# Patient Record
Sex: Female | Born: 1955 | Race: White | Hispanic: No | Marital: Married | Smoking: Never smoker
Health system: Southern US, Community
[De-identification: ages and names within clinical notes are randomized; demographics above are authoritative.]

## PROBLEM LIST (undated history)

## (undated) ENCOUNTER — Ambulatory Visit: Payer: 59

## (undated) DIAGNOSIS — Z9889 Other specified postprocedural states: Secondary | ICD-10-CM

## (undated) DIAGNOSIS — J189 Pneumonia, unspecified organism: Secondary | ICD-10-CM

## (undated) DIAGNOSIS — I10 Essential (primary) hypertension: Secondary | ICD-10-CM

## (undated) DIAGNOSIS — I8390 Asymptomatic varicose veins of unspecified lower extremity: Secondary | ICD-10-CM

## (undated) DIAGNOSIS — Z8489 Family history of other specified conditions: Secondary | ICD-10-CM

## (undated) DIAGNOSIS — R609 Edema, unspecified: Secondary | ICD-10-CM

## (undated) DIAGNOSIS — E039 Hypothyroidism, unspecified: Secondary | ICD-10-CM

## (undated) DIAGNOSIS — M1990 Unspecified osteoarthritis, unspecified site: Secondary | ICD-10-CM

## (undated) DIAGNOSIS — M21371 Foot drop, right foot: Secondary | ICD-10-CM

## (undated) DIAGNOSIS — R112 Nausea with vomiting, unspecified: Secondary | ICD-10-CM

## (undated) DIAGNOSIS — K219 Gastro-esophageal reflux disease without esophagitis: Secondary | ICD-10-CM

## (undated) HISTORY — PX: EYE SURGERY: SHX253

---

## 1986-02-23 HISTORY — PX: BREAST CYST ASPIRATION: SHX578

## 2004-04-24 ENCOUNTER — Ambulatory Visit: Payer: Self-pay | Admitting: Internal Medicine

## 2004-12-01 ENCOUNTER — Ambulatory Visit: Payer: Self-pay | Admitting: Obstetrics and Gynecology

## 2005-01-01 ENCOUNTER — Ambulatory Visit: Payer: Self-pay

## 2005-02-23 HISTORY — PX: VARICOSE VEIN SURGERY: SHX832

## 2006-01-20 ENCOUNTER — Ambulatory Visit: Payer: Self-pay | Admitting: Internal Medicine

## 2006-07-14 ENCOUNTER — Ambulatory Visit: Payer: Self-pay | Admitting: Internal Medicine

## 2006-11-10 ENCOUNTER — Ambulatory Visit: Payer: Self-pay | Admitting: Obstetrics and Gynecology

## 2006-12-10 ENCOUNTER — Ambulatory Visit: Payer: Self-pay | Admitting: Gastroenterology

## 2007-02-15 ENCOUNTER — Ambulatory Visit: Payer: Self-pay

## 2007-04-27 ENCOUNTER — Ambulatory Visit: Payer: Self-pay | Admitting: Internal Medicine

## 2008-02-22 ENCOUNTER — Ambulatory Visit: Payer: Self-pay

## 2008-05-10 ENCOUNTER — Ambulatory Visit: Payer: Self-pay | Admitting: Internal Medicine

## 2008-07-03 ENCOUNTER — Ambulatory Visit: Payer: Self-pay | Admitting: Internal Medicine

## 2008-07-04 ENCOUNTER — Ambulatory Visit: Payer: Self-pay

## 2008-09-27 ENCOUNTER — Ambulatory Visit: Payer: Self-pay | Admitting: Internal Medicine

## 2008-11-05 ENCOUNTER — Ambulatory Visit: Payer: Self-pay | Admitting: Internal Medicine

## 2009-03-05 ENCOUNTER — Ambulatory Visit: Payer: Self-pay | Admitting: Internal Medicine

## 2009-05-30 ENCOUNTER — Ambulatory Visit: Payer: Self-pay | Admitting: Internal Medicine

## 2010-01-30 ENCOUNTER — Ambulatory Visit: Payer: Self-pay | Admitting: Obstetrics and Gynecology

## 2010-01-30 ENCOUNTER — Ambulatory Visit: Payer: Self-pay | Admitting: Internal Medicine

## 2010-02-04 ENCOUNTER — Ambulatory Visit: Payer: Self-pay | Admitting: Obstetrics and Gynecology

## 2010-02-04 ENCOUNTER — Ambulatory Visit: Payer: Self-pay | Admitting: Internal Medicine

## 2010-03-10 ENCOUNTER — Other Ambulatory Visit: Payer: Self-pay | Admitting: Internal Medicine

## 2010-03-12 ENCOUNTER — Ambulatory Visit: Payer: Self-pay | Admitting: Physician Assistant

## 2010-03-17 ENCOUNTER — Ambulatory Visit: Payer: Self-pay | Admitting: Internal Medicine

## 2010-04-23 ENCOUNTER — Ambulatory Visit: Payer: Self-pay | Admitting: Internal Medicine

## 2010-06-05 ENCOUNTER — Other Ambulatory Visit: Payer: Self-pay | Admitting: Obstetrics and Gynecology

## 2011-02-11 ENCOUNTER — Ambulatory Visit: Payer: Self-pay | Admitting: Internal Medicine

## 2011-04-21 ENCOUNTER — Ambulatory Visit: Payer: Self-pay | Admitting: Internal Medicine

## 2012-02-09 ENCOUNTER — Ambulatory Visit: Payer: Self-pay | Admitting: Internal Medicine

## 2012-02-10 ENCOUNTER — Ambulatory Visit: Payer: Self-pay | Admitting: Internal Medicine

## 2012-02-10 LAB — URINALYSIS, COMPLETE
Blood: NEGATIVE
Glucose,UR: NEGATIVE mg/dL (ref 0–75)
Ketone: NEGATIVE
Nitrite: NEGATIVE
Protein: NEGATIVE
Specific Gravity: 1.01 (ref 1.003–1.030)

## 2012-02-10 LAB — CBC WITH DIFFERENTIAL/PLATELET
Eosinophil #: 0.1 10*3/uL (ref 0.0–0.7)
Eosinophil %: 1.6 %
HGB: 12.7 g/dL (ref 12.0–16.0)
Lymphocyte %: 28.7 %
MCHC: 32.7 g/dL (ref 32.0–36.0)
MCV: 83 fL (ref 80–100)
Monocyte #: 0.6 x10 3/mm (ref 0.2–0.9)
Neutrophil %: 60.5 %
Platelet: 242 10*3/uL (ref 150–440)
RBC: 4.67 10*6/uL (ref 3.80–5.20)
WBC: 7.3 10*3/uL (ref 3.6–11.0)

## 2012-02-10 LAB — BASIC METABOLIC PANEL
Anion Gap: 7 (ref 7–16)
Creatinine: 0.92 mg/dL (ref 0.60–1.30)
EGFR (African American): >60
EGFR (Non-African Amer.): >60
Glucose: 78 mg/dL (ref 65–99)
Sodium: 140 mmol/L (ref 136–145)

## 2012-02-23 ENCOUNTER — Ambulatory Visit: Payer: Self-pay | Admitting: Internal Medicine

## 2012-09-21 ENCOUNTER — Ambulatory Visit: Payer: Self-pay | Admitting: Internal Medicine

## 2012-09-21 LAB — URINALYSIS, COMPLETE
Bacteria: NEGATIVE
Blood: NEGATIVE
Glucose,UR: NEGATIVE mg/dL (ref 0–75)
Leukocyte Esterase: NEGATIVE
Nitrite: NEGATIVE
Protein: NEGATIVE
Specific Gravity: 1.015 (ref 1.003–1.030)

## 2012-09-21 LAB — LIPID PANEL
Cholesterol: 152 mg/dL (ref 0–200)
HDL Cholesterol: 70 mg/dL — ABNORMAL HIGH (ref 40–60)
Ldl Cholesterol, Calc: 69 mg/dL (ref 0–100)
Triglycerides: 65 mg/dL (ref 0–200)
VLDL Cholesterol, Calc: 13 mg/dL (ref 5–40)

## 2012-09-21 LAB — CBC WITH DIFFERENTIAL/PLATELET
Basophil #: 0.1 10*3/uL (ref 0.0–0.1)
Eosinophil #: 0.1 10*3/uL (ref 0.0–0.7)
Eosinophil %: 1.9 %
HGB: 13.4 g/dL (ref 12.0–16.0)
Lymphocyte %: 23.7 %
MCV: 82 fL (ref 80–100)
Monocyte %: 7.9 %
Neutrophil #: 4.6 10*3/uL (ref 1.4–6.5)
Platelet: 232 10*3/uL (ref 150–440)
RDW: 14.8 % — ABNORMAL HIGH (ref 11.5–14.5)
WBC: 7.1 10*3/uL (ref 3.6–11.0)

## 2012-09-21 LAB — BASIC METABOLIC PANEL
Chloride: 100 mmol/L (ref 98–107)
Co2: 33 mmol/L — ABNORMAL HIGH (ref 21–32)
EGFR (African American): >60
Osmolality: 281 (ref 275–301)
Potassium: 3.9 mmol/L (ref 3.5–5.1)
Sodium: 140 mmol/L (ref 136–145)

## 2012-10-25 ENCOUNTER — Ambulatory Visit: Payer: Self-pay | Admitting: Internal Medicine

## 2012-10-25 LAB — TSH: Thyroid Stimulating Horm: 5.24 u[IU]/mL — ABNORMAL HIGH

## 2013-01-31 ENCOUNTER — Ambulatory Visit: Payer: Self-pay | Admitting: Internal Medicine

## 2014-01-28 ENCOUNTER — Ambulatory Visit: Payer: Self-pay | Admitting: Physician Assistant

## 2014-02-09 ENCOUNTER — Ambulatory Visit: Payer: Self-pay | Admitting: Internal Medicine

## 2014-02-23 DIAGNOSIS — M21371 Foot drop, right foot: Secondary | ICD-10-CM

## 2014-02-23 HISTORY — DX: Foot drop, right foot: M21.371

## 2014-03-08 NOTE — H&P (Signed)
TOTAL KNEE ADMISSION H&P  Patient is being admitted for right total knee arthroplasty.  Subjective:  Chief Complaint:     Right knee primary OA /pain.  HPI: Julie Castillo, 59 y.o. female, has a history of pain and functional disability in the right knee due to arthritis and has failed non-surgical conservative treatments for greater than 12 weeks to includeNSAID's and/or analgesics, corticosteriod injections, viscosupplementation injections, use of assistive devices and activity modification.  Onset of symptoms was gradual, starting 3-4 years ago with gradually worsening course since that time. The patient noted no past surgery on the right knee(s).  Patient currently rates pain in the right knee(s) at 9 out of 10 with activity. Patient has night pain, worsening of pain with activity and weight bearing, pain that interferes with activities of daily living, pain with passive range of motion, crepitus and joint swelling.  Patient has evidence of periarticular osteophytes and joint space narrowing by imaging studies.  There is no active infection.  Risks, benefits and expectations were discussed with the patient.  Risks including but not limited to the risk of anesthesia, blood clots, nerve damage, blood vessel damage, failure of the prosthesis, infection and up to and including death.  Patient understand the risks, benefits and expectations and wishes to proceed with surgery.   PCP: Emerson Monte J  D/C Plans:      Home with HHPT  Post-op Meds:       No Rx given   Tranexamic Acid:      To be given - IV    Decadron:      Is to be given  FYI:     ASA post-op  Norco post-op    Past Medical History  Diagnosis Date  . Family history of adverse reaction to anesthesia     father- post op became violent   . Hypertension   . Varicose veins   . Edema     lower extremities after standing a long time   . Pneumonia     hx of   . Hypothyroidism   . GERD (gastroesophageal reflux disease)   .  Arthritis     Past Surgical History  Procedure Laterality Date  . Eye surgery      left eye surgery x 2 ( eyelid)     No prescriptions prior to admission   Allergies  Allergen Reactions  . Adhesive [Tape]     Sensitive skin- can use paper tape  . Sulfa Antibiotics Hives    History  Substance Use Topics  . Smoking status: Never Smoker   . Smokeless tobacco: Never Used  . Alcohol Use: Yes     Comment: occasional     No family history on file.   Review of Systems  Constitutional: Negative.   HENT: Negative.   Eyes: Negative.   Respiratory: Negative.   Cardiovascular: Negative.   Gastrointestinal: Positive for heartburn.  Genitourinary: Negative.   Musculoskeletal: Positive for joint pain.  Skin: Negative.   Neurological: Negative.   Endo/Heme/Allergies: Negative.   Psychiatric/Behavioral: Negative.     Objective:  Physical Exam  Constitutional: She is oriented to person, place, and time. She appears well-developed and well-nourished.  HENT:  Head: Normocephalic and atraumatic.  Eyes: Pupils are equal, round, and reactive to light.  Neck: Neck supple. No JVD present. No tracheal deviation present. No thyromegaly present.  Cardiovascular: Normal rate, regular rhythm, normal heart sounds and intact distal pulses.   Respiratory: Effort normal and breath sounds  normal. No stridor. No respiratory distress. She has no wheezes.  GI: Soft. There is no tenderness. There is no guarding.  Musculoskeletal:       Right knee: She exhibits decreased range of motion, swelling and bony tenderness. She exhibits no ecchymosis, no deformity, no laceration and no erythema. Tenderness found.  Lymphadenopathy:    She has no cervical adenopathy.  Neurological: She is alert and oriented to person, place, and time.  Skin: Skin is warm and dry.  Psychiatric: She has a normal mood and affect.     Imaging Review Plain radiographs demonstrate severe degenerative joint disease of the right  knee(s). The overall alignment is neutral. The bone quality appears to be good for age and reported activity level.  Assessment/Plan:  End stage arthritis, right knee   The patient history, physical examination, clinical judgment of the provider and imaging studies are consistent with end stage degenerative joint disease of the right knee(s) and total knee arthroplasty is deemed medically necessary. The treatment options including medical management, injection therapy arthroscopy and arthroplasty were discussed at length. The risks and benefits of total knee arthroplasty were presented and reviewed. The risks due to aseptic loosening, infection, stiffness, patella tracking problems, thromboembolic complications and other imponderables were discussed. The patient acknowledged the explanation, agreed to proceed with the plan and consent was signed. Patient is being admitted for inpatient treatment for surgery, pain control, PT, OT, prophylactic antibiotics, VTE prophylaxis, progressive ambulation and ADL's and discharge planning. The patient is planning to be discharged home with home health services.    West Pugh Nimrat Woolworth   PA-C  03/15/2014, 9:30 AM

## 2014-03-08 NOTE — Patient Instructions (Addendum)
Julie Castillo  03/08/2014   Your procedure is scheduled on: 03/20/14    Report to [facility] Main  Entrance and follow signs to               [place] at       [place] AM.  Call this number if you have problems the morning of surgery [phone]   Remember:  Do not eat food or drink liquids :After Midnight.     Take these medicines the morning of surgery with A SIP OF WATER: Prilosec, Zyrtec, Synthroid                                You may not have any metal on your body including hair pins and              piercings  Do not wear jewelry, make-up, lotions, powders or perfumes.             Do not wear nail polish.  Do not shave  48 hours prior to surgery.                Do not bring valuables to the hospital. [place].  Contacts, dentures or bridgework may not be worn into surgery.  Leave suitcase in the car. After surgery it may be brought to your room.     Special Instructions: coughing and deep breathing exercises, leg exercises               Please read over the following fact sheets you were given: _____________________________________________________________________             [facility] - Preparing for Surgery Before surgery, you can play an important role.  Because skin is not sterile, your skin needs to be as free of germs as possible.  You can reduce the number of germs on your skin by washing with CHG (chlorahexidine gluconate) soap before surgery.  CHG is an antiseptic cleaner which kills germs and bonds with the skin to continue killing germs even after washing. Please DO NOT use if you have an allergy to CHG or antibacterial soaps.  If your skin becomes reddened/irritated stop using the CHG and inform your nurse when you arrive at Short Stay. Do not shave (including legs and underarms) for at least 48 hours prior to the first CHG shower.  You may shave your face/neck. Please follow  these instructions carefully:  1.  Shower with CHG Soap the night before surgery and the  morning of Surgery.  2.  If you choose to wash your hair, wash your hair first as usual with your  normal  shampoo.  3.  After you shampoo, rinse your hair and body thoroughly to remove the  shampoo.                           4.  Use CHG as you would any other liquid soap.  You can apply chg directly  to the skin and wash                       Gently with a scrungie or clean washcloth.  5.  Apply the  CHG Soap to your body ONLY FROM THE NECK DOWN.   Do not use on face/ open                           Wound or open sores. Avoid contact with eyes, ears mouth and genitals (private parts).                       Wash face,  Genitals (private parts) with your normal soap.             6.  Wash thoroughly, paying special attention to the area where your surgery  will be performed.  7.  Thoroughly rinse your body with warm water from the neck down.  8.  DO NOT shower/wash with your normal soap after using and rinsing off  the CHG Soap.                9.  Pat yourself dry with a clean towel.            10.  Wear clean pajamas.            11.  Place clean sheets on your bed the night of your first shower and do not  sleep with pets. Day of Surgery : Do not apply any lotions/deodorants the morning of surgery.  Please wear clean clothes to the hospital/surgery center.  FAILURE TO FOLLOW THESE INSTRUCTIONS MAY RESULT IN THE CANCELLATION OF YOUR SURGERY PATIENT SIGNATURE_________________________________  NURSE SIGNATURE__________________________________  ________________________________________________________________________  WHAT IS A BLOOD TRANSFUSION? Blood Transfusion Information  A transfusion is the replacement of blood or some of its parts. Blood is made up of multiple cells which provide different functions.  Red blood cells carry oxygen and are used for blood loss replacement.  White blood cells fight  against infection.  Platelets control bleeding.  Plasma helps clot blood.  Other blood products are available for specialized needs, such as hemophilia or other clotting disorders. BEFORE THE TRANSFUSION  Who gives blood for transfusions?   Healthy volunteers who are fully evaluated to make sure their blood is safe. This is blood bank blood. Transfusion therapy is the safest it has ever been in the practice of medicine. Before blood is taken from a donor, a complete history is taken to make sure that person has no history of diseases nor engages in risky social behavior (examples are intravenous drug use or sexual activity with multiple partners). The donor's travel history is screened to minimize risk of transmitting infections, such as malaria. The donated blood is tested for signs of infectious diseases, such as HIV and hepatitis. The blood is then tested to be sure it is compatible with you in order to minimize the chance of a transfusion reaction. If you or a relative donates blood, this is often done in anticipation of surgery and is not appropriate for emergency situations. It takes many days to process the donated blood. RISKS AND COMPLICATIONS Although transfusion therapy is very safe and saves many lives, the main dangers of transfusion include:  1. Getting an infectious disease. 2. Developing a transfusion reaction. This is an allergic reaction to something in the blood you were given. Every precaution is taken to prevent this. The decision to have a blood transfusion has been considered carefully by your caregiver before blood is given. Blood is not given unless the benefits outweigh the risks. AFTER THE TRANSFUSION  Right after receiving a blood transfusion,  you will usually feel much better and more energetic. This is especially true if your red blood cells have gotten low (anemic). The transfusion raises the level of the red blood cells which carry oxygen, and this usually causes an  energy increase.  The nurse administering the transfusion will monitor you carefully for complications. HOME CARE INSTRUCTIONS  No special instructions are needed after a transfusion. You may find your energy is better. Speak with your caregiver about any limitations on activity for underlying diseases you may have. SEEK MEDICAL CARE IF:   Your condition is not improving after your transfusion.  You develop redness or irritation at the intravenous (IV) site. SEEK IMMEDIATE MEDICAL CARE IF:  Any of the following symptoms occur over the next 12 hours:  Shaking chills.  You have a temperature by mouth above 102 F (38.9 C), not controlled by medicine.  Chest, back, or muscle pain.  People around you feel you are not acting correctly or are confused.  Shortness of breath or difficulty breathing.  Dizziness and fainting.  You get a rash or develop hives.  You have a decrease in urine output.  Your urine turns a dark color or changes to pink, red, or brown. Any of the following symptoms occur over the next 10 days:  You have a temperature by mouth above 102 F (38.9 C), not controlled by medicine.  Shortness of breath.  Weakness after normal activity.  The white part of the eye turns yellow (jaundice).  You have a decrease in the amount of urine or are urinating less often.  Your urine turns a dark color or changes to pink, red, or brown. Document Released: 02/07/2000 Document Revised: 05/04/2011 Document Reviewed: 09/26/2007 ExitCare Patient Information 2014 [place].  _______________________________________________________________________  Incentive Spirometer  An incentive spirometer is a tool that can help keep your lungs clear and active. This tool measures how well you are filling your lungs with each breath. Taking long deep breaths may help reverse or decrease the chance of developing breathing (pulmonary) problems (especially infection) following:  A  long period of time when you are unable to move or be active. BEFORE THE PROCEDURE   If the spirometer includes an indicator to show your best effort, your nurse or respiratory therapist will set it to a desired goal.  If possible, sit up straight or lean slightly forward. Try not to slouch.  Hold the incentive spirometer in an upright position. INSTRUCTIONS FOR USE  3. Sit on the edge of your bed if possible, or sit up as far as you can in bed or on a chair. 4. Hold the incentive spirometer in an upright position. 5. Breathe out normally. 6. Place the mouthpiece in your mouth and seal your lips tightly around it. 7. Breathe in slowly and as deeply as possible, raising the piston or the ball toward the top of the column. 8. Hold your breath for 3-5 seconds or for as long as possible. Allow the piston or ball to fall to the bottom of the column. 9. Remove the mouthpiece from your mouth and breathe out normally. 10. Rest for a few seconds and repeat Steps 1 through 7 at least 10 times every 1-2 hours when you are awake. Take your time and take a few normal breaths between deep breaths. 11. The spirometer may include an indicator to show your best effort. Use the indicator as a goal to work toward during each repetition. 12. After each set of 10 deep breaths,  practice coughing to be sure your lungs are clear. If you have an incision (the cut made at the time of surgery), support your incision when coughing by placing a pillow or rolled up towels firmly against it. Once you are able to get out of bed, walk around indoors and cough well. You may stop using the incentive spirometer when instructed by your caregiver.  RISKS AND COMPLICATIONS  Take your time so you do not get dizzy or light-headed.  If you are in pain, you may need to take or ask for pain medication before doing incentive spirometry. It is harder to take a deep breath if you are having pain. AFTER USE  Rest and breathe slowly and  easily.  It can be helpful to keep track of a log of your progress. Your caregiver can provide you with a simple table to help with this. If you are using the spirometer at home, follow these instructions: [place] IF:   You are having difficultly using the spirometer.  You have trouble using the spirometer as often as instructed.  Your pain medication is not giving enough relief while using the spirometer.  You develop fever of 100.5 F (38.1 C) or higher. SEEK IMMEDIATE MEDICAL CARE IF:   You cough up bloody sputum that had not been present before.  You develop fever of 102 F (38.9 C) or greater.  You develop worsening pain at or near the incision site. MAKE SURE YOU:   Understand these instructions.  Will watch your condition.  Will get help right away if you are not doing well or get worse. Document Released: 06/22/2006 Document Revised: 05/04/2011 Document Reviewed: 08/23/2006 [facility] Patient Information 2014 [place], [place].   ________________________________________________________________________

## 2014-03-12 ENCOUNTER — Encounter (HOSPITAL_COMMUNITY)
Admission: RE | Admit: 2014-03-12 | Discharge: 2014-03-12 | Disposition: A | Discharge status: Home / Self Care | Payer: 59 | Source: Ambulatory Visit | Attending: Orthopedic Surgery | Admitting: Orthopedic Surgery

## 2014-03-12 ENCOUNTER — Encounter (HOSPITAL_COMMUNITY): Payer: Self-pay

## 2014-03-12 ENCOUNTER — Ambulatory Visit (HOSPITAL_COMMUNITY)
Admission: RE | Admit: 2014-03-12 | Discharge: 2014-03-12 | Disposition: A | Discharge status: Home / Self Care | Payer: 59 | Source: Ambulatory Visit | Attending: Orthopedic Surgery | Admitting: Orthopedic Surgery

## 2014-03-12 DIAGNOSIS — Z01818 Encounter for other preprocedural examination: Secondary | ICD-10-CM

## 2014-03-12 DIAGNOSIS — M4854XA Collapsed vertebra, not elsewhere classified, thoracic region, initial encounter for fracture: Secondary | ICD-10-CM | POA: Insufficient documentation

## 2014-03-12 HISTORY — DX: Edema, unspecified: R60.9

## 2014-03-12 HISTORY — DX: Gastro-esophageal reflux disease without esophagitis: K21.9

## 2014-03-12 HISTORY — DX: Unspecified osteoarthritis, unspecified site: M19.90

## 2014-03-12 HISTORY — DX: Essential (primary) hypertension: I10

## 2014-03-12 HISTORY — DX: Pneumonia, unspecified organism: J18.9

## 2014-03-12 HISTORY — DX: Family history of other specified conditions: Z84.89

## 2014-03-12 HISTORY — DX: Hypothyroidism, unspecified: E03.9

## 2014-03-12 HISTORY — DX: Asymptomatic varicose veins of unspecified lower extremity: I83.90

## 2014-03-12 LAB — ABO/RH: ABO/RH(D): O POS

## 2014-03-12 LAB — SURGICAL PCR SCREEN
MRSA, PCR: NEGATIVE
STAPHYLOCOCCUS AUREUS: NEGATIVE

## 2014-03-12 LAB — PROTIME-INR
INR: 0.95 (ref 0.00–1.49)
Prothrombin Time: 12.8 seconds (ref 11.6–15.2)

## 2014-03-12 LAB — APTT: aPTT: 29 seconds (ref 24–37)

## 2014-03-12 NOTE — Progress Notes (Signed)
Clearance- Dr Ramonita Lab 12/23/2013 on chart  2012 Stress Test on chart  Labs done 03/02/2014- CMP, Urinalysis, CBC/DIFF

## 2014-03-13 NOTE — Progress Notes (Signed)
Final EKG done 03/12/2014 in EPIC.

## 2014-03-13 NOTE — Progress Notes (Signed)
Dr Landry Dyke ( anesthesia) made aware of medical history, EKG from  03/12/2014 and stress test results from 2012 along with baseline EKG on stress test from 2012 .  No new orders given.

## 2014-03-20 ENCOUNTER — Encounter (HOSPITAL_COMMUNITY): Payer: Self-pay | Admitting: *Deleted

## 2014-03-20 ENCOUNTER — Inpatient Hospital Stay (HOSPITAL_COMMUNITY): Payer: 59 | Admitting: Certified Registered Nurse Anesthetist

## 2014-03-20 ENCOUNTER — Inpatient Hospital Stay (HOSPITAL_COMMUNITY)
Admission: RE | Admit: 2014-03-20 | Discharge: 2014-03-22 | DRG: 470 | Disposition: A | Discharge status: Home Health | Payer: 59 | Source: Ambulatory Visit | Attending: Orthopedic Surgery | Admitting: Orthopedic Surgery

## 2014-03-20 ENCOUNTER — Encounter (HOSPITAL_COMMUNITY)
Admission: RE | Disposition: A | Discharge status: Home Health | Payer: Self-pay | Source: Ambulatory Visit | Attending: Orthopedic Surgery

## 2014-03-20 DIAGNOSIS — K219 Gastro-esophageal reflux disease without esophagitis: Secondary | ICD-10-CM | POA: Diagnosis present

## 2014-03-20 DIAGNOSIS — Z951 Presence of aortocoronary bypass graft: Secondary | ICD-10-CM

## 2014-03-20 DIAGNOSIS — M659 Synovitis and tenosynovitis, unspecified: Secondary | ICD-10-CM | POA: Diagnosis present

## 2014-03-20 DIAGNOSIS — M25561 Pain in right knee: Secondary | ICD-10-CM | POA: Diagnosis present

## 2014-03-20 DIAGNOSIS — M1711 Unilateral primary osteoarthritis, right knee: Secondary | ICD-10-CM | POA: Diagnosis present

## 2014-03-20 DIAGNOSIS — Z96659 Presence of unspecified artificial knee joint: Secondary | ICD-10-CM

## 2014-03-20 DIAGNOSIS — E039 Hypothyroidism, unspecified: Secondary | ICD-10-CM | POA: Diagnosis present

## 2014-03-20 DIAGNOSIS — I10 Essential (primary) hypertension: Secondary | ICD-10-CM | POA: Diagnosis present

## 2014-03-20 DIAGNOSIS — Z96651 Presence of right artificial knee joint: Secondary | ICD-10-CM

## 2014-03-20 DIAGNOSIS — I252 Old myocardial infarction: Secondary | ICD-10-CM | POA: Diagnosis not present

## 2014-03-20 HISTORY — PX: TOTAL KNEE ARTHROPLASTY: SHX125

## 2014-03-20 LAB — TYPE AND SCREEN
ABO/RH(D): O POS
ANTIBODY SCREEN: NEGATIVE

## 2014-03-20 SURGERY — ARTHROPLASTY, KNEE, TOTAL
Anesthesia: Spinal | Laterality: Right

## 2014-03-20 MED ORDER — DOCUSATE SODIUM 100 MG PO CAPS
100.0000 mg | ORAL_CAPSULE | Freq: Two times a day (BID) | ORAL | Status: DC
  Administered 2014-03-20 – 2014-03-22 (×4): 100 mg via ORAL

## 2014-03-20 MED ORDER — ONDANSETRON HCL 4 MG/2ML IJ SOLN
INTRAMUSCULAR | Status: DC | PRN
  Administered 2014-03-20: 4 mg via INTRAVENOUS

## 2014-03-20 MED ORDER — HYDROCHLOROTHIAZIDE 25 MG PO TABS
25.0000 mg | ORAL_TABLET | Freq: Every day | ORAL | Status: DC
  Administered 2014-03-20 – 2014-03-22 (×3): 25 mg via ORAL
  Filled 2014-03-20 (×3): qty 1

## 2014-03-20 MED ORDER — POLYETHYLENE GLYCOL 3350 17 G PO PACK
17.0000 g | PACK | Freq: Two times a day (BID) | ORAL | Status: DC
  Administered 2014-03-21 – 2014-03-22 (×3): 17 g via ORAL

## 2014-03-20 MED ORDER — CEFAZOLIN SODIUM-DEXTROSE 2-3 GM-% IV SOLR
INTRAVENOUS | Status: AC
  Filled 2014-03-20: qty 50

## 2014-03-20 MED ORDER — DIPHENHYDRAMINE HCL 25 MG PO CAPS
25.0000 mg | ORAL_CAPSULE | Freq: Four times a day (QID) | ORAL | Status: DC | PRN
  Administered 2014-03-21: 25 mg via ORAL
  Filled 2014-03-20: qty 1

## 2014-03-20 MED ORDER — FERROUS SULFATE 325 (65 FE) MG PO TABS
325.0000 mg | ORAL_TABLET | Freq: Three times a day (TID) | ORAL | Status: DC
  Administered 2014-03-20 – 2014-03-21 (×2): 325 mg via ORAL
  Filled 2014-03-20 (×8): qty 1

## 2014-03-20 MED ORDER — ONDANSETRON HCL 4 MG/2ML IJ SOLN
INTRAMUSCULAR | Status: AC
  Filled 2014-03-20: qty 2

## 2014-03-20 MED ORDER — PROPOFOL INFUSION 10 MG/ML OPTIME
INTRAVENOUS | Status: DC | PRN
  Administered 2014-03-20: 50 ug/kg/min via INTRAVENOUS

## 2014-03-20 MED ORDER — ASPIRIN EC 325 MG PO TBEC
325.0000 mg | DELAYED_RELEASE_TABLET | Freq: Two times a day (BID) | ORAL | Status: DC
  Administered 2014-03-21 – 2014-03-22 (×3): 325 mg via ORAL
  Filled 2014-03-20 (×5): qty 1

## 2014-03-20 MED ORDER — OXYCODONE HCL 5 MG/5ML PO SOLN
5.0000 mg | Freq: Once | ORAL | Status: DC | PRN
  Filled 2014-03-20: qty 5

## 2014-03-20 MED ORDER — PROPOFOL 10 MG/ML IV BOLUS
INTRAVENOUS | Status: AC
  Filled 2014-03-20: qty 20

## 2014-03-20 MED ORDER — LORATADINE 10 MG PO TABS
10.0000 mg | ORAL_TABLET | Freq: Every day | ORAL | Status: DC
  Administered 2014-03-21 – 2014-03-22 (×2): 10 mg via ORAL
  Filled 2014-03-20 (×2): qty 1

## 2014-03-20 MED ORDER — LACTATED RINGERS IV SOLN: INTRAVENOUS | Status: DC

## 2014-03-20 MED ORDER — KETOROLAC TROMETHAMINE 30 MG/ML IJ SOLN
INTRAMUSCULAR | Status: DC | PRN
Start: 2014-03-20 — End: 2014-03-20
  Administered 2014-03-20: 30 mg via INTRAVENOUS

## 2014-03-20 MED ORDER — LEVOTHYROXINE SODIUM 25 MCG PO TABS
25.0000 ug | ORAL_TABLET | Freq: Every day | ORAL | Status: DC
  Administered 2014-03-21 – 2014-03-22 (×2): 25 ug via ORAL
  Filled 2014-03-20 (×4): qty 1

## 2014-03-20 MED ORDER — BUPIVACAINE IN DEXTROSE 0.75-8.25 % IT SOLN
INTRATHECAL | Status: DC | PRN
  Administered 2014-03-20: 1.8 mL via INTRATHECAL

## 2014-03-20 MED ORDER — SODIUM CHLORIDE 0.9 % IJ SOLN
INTRAMUSCULAR | Status: AC
  Filled 2014-03-20: qty 50

## 2014-03-20 MED ORDER — 0.9 % SODIUM CHLORIDE (POUR BTL) OPTIME
TOPICAL | Status: DC | PRN
  Administered 2014-03-20: 1000 mL

## 2014-03-20 MED ORDER — MIDAZOLAM HCL 2 MG/2ML IJ SOLN
INTRAMUSCULAR | Status: AC
  Filled 2014-03-20: qty 2

## 2014-03-20 MED ORDER — DEXAMETHASONE SODIUM PHOSPHATE 10 MG/ML IJ SOLN
INTRAMUSCULAR | Status: DC | PRN
  Administered 2014-03-20: 10 mg via INTRAVENOUS

## 2014-03-20 MED ORDER — METOCLOPRAMIDE HCL 10 MG PO TABS
5.0000 mg | ORAL_TABLET | Freq: Three times a day (TID) | ORAL | Status: DC | PRN
  Administered 2014-03-21: 5 mg via ORAL

## 2014-03-20 MED ORDER — DEXAMETHASONE SODIUM PHOSPHATE 10 MG/ML IJ SOLN: 10.0000 mg | Freq: Once | INTRAMUSCULAR | Status: DC

## 2014-03-20 MED ORDER — HYDROCODONE-ACETAMINOPHEN 7.5-325 MG PO TABS
1.0000 | ORAL_TABLET | ORAL | Status: DC
  Administered 2014-03-20 – 2014-03-21 (×6): 2 via ORAL
  Filled 2014-03-20 (×6): qty 2

## 2014-03-20 MED ORDER — LIDOCAINE HCL (CARDIAC) 20 MG/ML IV SOLN
INTRAVENOUS | Status: AC
  Filled 2014-03-20: qty 5

## 2014-03-20 MED ORDER — DEXAMETHASONE SODIUM PHOSPHATE 10 MG/ML IJ SOLN
10.0000 mg | Freq: Once | INTRAMUSCULAR | Status: AC
  Administered 2014-03-21: 10 mg via INTRAVENOUS
  Filled 2014-03-20: qty 1

## 2014-03-20 MED ORDER — KETOROLAC TROMETHAMINE 30 MG/ML IJ SOLN
INTRAMUSCULAR | Status: AC
  Filled 2014-03-20: qty 1

## 2014-03-20 MED ORDER — SODIUM CHLORIDE 0.9 % IJ SOLN
INTRAMUSCULAR | Status: DC | PRN
  Administered 2014-03-20: 30 mL via INTRAVENOUS

## 2014-03-20 MED ORDER — OXYCODONE HCL 5 MG PO TABS: 5.0000 mg | ORAL_TABLET | Freq: Once | ORAL | Status: DC | PRN

## 2014-03-20 MED ORDER — METOCLOPRAMIDE HCL 5 MG/ML IJ SOLN
5.0000 mg | Freq: Three times a day (TID) | INTRAMUSCULAR | Status: DC | PRN
  Administered 2014-03-21: 10 mg via INTRAVENOUS
  Filled 2014-03-20: qty 2

## 2014-03-20 MED ORDER — ONDANSETRON HCL 4 MG/2ML IJ SOLN
4.0000 mg | Freq: Four times a day (QID) | INTRAMUSCULAR | Status: DC | PRN
  Administered 2014-03-20 – 2014-03-21 (×2): 4 mg via INTRAVENOUS
  Filled 2014-03-20 (×2): qty 2

## 2014-03-20 MED ORDER — CEFAZOLIN SODIUM-DEXTROSE 2-3 GM-% IV SOLR
2.0000 g | Freq: Four times a day (QID) | INTRAVENOUS | Status: AC
  Administered 2014-03-20 (×2): 2 g via INTRAVENOUS
  Filled 2014-03-20 (×2): qty 50

## 2014-03-20 MED ORDER — MENTHOL 3 MG MT LOZG
1.0000 | LOZENGE | OROMUCOSAL | Status: DC | PRN
  Filled 2014-03-20: qty 9

## 2014-03-20 MED ORDER — HYDROMORPHONE HCL 1 MG/ML IJ SOLN
0.5000 mg | INTRAMUSCULAR | Status: DC | PRN
  Administered 2014-03-20 – 2014-03-21 (×6): 1 mg via INTRAVENOUS
  Filled 2014-03-20 (×6): qty 1

## 2014-03-20 MED ORDER — PHENOL 1.4 % MT LIQD
1.0000 | OROMUCOSAL | Status: DC | PRN
  Filled 2014-03-20: qty 177

## 2014-03-20 MED ORDER — METHOCARBAMOL 500 MG PO TABS
500.0000 mg | ORAL_TABLET | Freq: Four times a day (QID) | ORAL | Status: DC | PRN
  Administered 2014-03-20 – 2014-03-22 (×4): 500 mg via ORAL
  Filled 2014-03-20 (×4): qty 1

## 2014-03-20 MED ORDER — FENTANYL CITRATE 0.05 MG/ML IJ SOLN
INTRAMUSCULAR | Status: AC
  Filled 2014-03-20: qty 2

## 2014-03-20 MED ORDER — SODIUM CHLORIDE 0.9 % IV SOLN
INTRAVENOUS | Status: DC
  Administered 2014-03-20 – 2014-03-21 (×2): via INTRAVENOUS
  Filled 2014-03-20 (×8): qty 1000

## 2014-03-20 MED ORDER — BUPIVACAINE-EPINEPHRINE (PF) 0.25% -1:200000 IJ SOLN
INTRAMUSCULAR | Status: DC | PRN
  Administered 2014-03-20: 30 mL

## 2014-03-20 MED ORDER — ACETAMINOPHEN 160 MG/5ML PO SOLN
325.0000 mg | ORAL | Status: DC | PRN
  Filled 2014-03-20: qty 20.3

## 2014-03-20 MED ORDER — SODIUM CHLORIDE 0.9 % IR SOLN
Status: DC | PRN
  Administered 2014-03-20: 1000 mL

## 2014-03-20 MED ORDER — LIDOCAINE HCL (CARDIAC) 20 MG/ML IV SOLN
INTRAVENOUS | Status: DC | PRN
Start: 2014-03-20 — End: 2014-03-20
  Administered 2014-03-20: 100 mg via INTRAVENOUS

## 2014-03-20 MED ORDER — ACETAMINOPHEN 325 MG PO TABS: 325.0000 mg | ORAL_TABLET | ORAL | Status: DC | PRN

## 2014-03-20 MED ORDER — MAGNESIUM CITRATE PO SOLN: 1.0000 | Freq: Once | ORAL | Status: AC | PRN

## 2014-03-20 MED ORDER — DEXAMETHASONE SODIUM PHOSPHATE 10 MG/ML IJ SOLN
INTRAMUSCULAR | Status: AC
  Filled 2014-03-20: qty 1

## 2014-03-20 MED ORDER — MIDAZOLAM HCL 5 MG/5ML IJ SOLN
INTRAMUSCULAR | Status: DC | PRN
  Administered 2014-03-20: 2 mg via INTRAVENOUS

## 2014-03-20 MED ORDER — METHOCARBAMOL 1000 MG/10ML IJ SOLN
500.0000 mg | Freq: Four times a day (QID) | INTRAVENOUS | Status: DC | PRN
  Administered 2014-03-20: 500 mg via INTRAVENOUS
  Filled 2014-03-20 (×2): qty 5

## 2014-03-20 MED ORDER — CHLORHEXIDINE GLUCONATE 4 % EX LIQD: 60.0000 mL | Freq: Once | CUTANEOUS | Status: DC

## 2014-03-20 MED ORDER — BISACODYL 10 MG RE SUPP: 10.0000 mg | Freq: Every day | RECTAL | Status: DC | PRN

## 2014-03-20 MED ORDER — FENTANYL CITRATE 0.05 MG/ML IJ SOLN
INTRAMUSCULAR | Status: DC | PRN
  Administered 2014-03-20: 25 ug via INTRAVENOUS
  Administered 2014-03-20: 50 ug via INTRAVENOUS
  Administered 2014-03-20: 25 ug via INTRAVENOUS

## 2014-03-20 MED ORDER — BUPIVACAINE-EPINEPHRINE (PF) 0.25% -1:200000 IJ SOLN
INTRAMUSCULAR | Status: AC
  Filled 2014-03-20: qty 30

## 2014-03-20 MED ORDER — CEFAZOLIN SODIUM-DEXTROSE 2-3 GM-% IV SOLR
2.0000 g | INTRAVENOUS | Status: AC
  Administered 2014-03-20: 2 g via INTRAVENOUS

## 2014-03-20 MED ORDER — HYDROMORPHONE HCL 1 MG/ML IJ SOLN: 0.2500 mg | INTRAMUSCULAR | Status: DC | PRN

## 2014-03-20 MED ORDER — LACTATED RINGERS IV SOLN
INTRAVENOUS | Status: DC | PRN
  Administered 2014-03-20: 07:00:00 via INTRAVENOUS

## 2014-03-20 MED ORDER — TRANEXAMIC ACID 100 MG/ML IV SOLN
1000.0000 mg | Freq: Once | INTRAVENOUS | Status: AC
  Administered 2014-03-20: 1000 mg via INTRAVENOUS
  Filled 2014-03-20 (×2): qty 10

## 2014-03-20 MED ORDER — PANTOPRAZOLE SODIUM 40 MG PO TBEC
40.0000 mg | DELAYED_RELEASE_TABLET | Freq: Two times a day (BID) | ORAL | Status: DC
Start: 2014-03-20 — End: 2014-03-22
  Administered 2014-03-20 – 2014-03-22 (×4): 40 mg via ORAL
  Filled 2014-03-20 (×8): qty 1

## 2014-03-20 MED ORDER — ALUM & MAG HYDROXIDE-SIMETH 200-200-20 MG/5ML PO SUSP: 30.0000 mL | ORAL | Status: DC | PRN

## 2014-03-20 MED ORDER — ONDANSETRON HCL 4 MG PO TABS
4.0000 mg | ORAL_TABLET | Freq: Four times a day (QID) | ORAL | Status: DC | PRN
  Administered 2014-03-22: 4 mg via ORAL

## 2014-03-20 SURGICAL SUPPLY — 51 items
BAG ZIPLOCK 12X15 (MISCELLANEOUS) IMPLANT
BANDAGE ELASTIC 6 VELCRO ST LF (GAUZE/BANDAGES/DRESSINGS) ×2 IMPLANT
BANDAGE ESMARK 6X9 LF (GAUZE/BANDAGES/DRESSINGS) ×1 IMPLANT
BLADE SAW SGTL 13.0X1.19X90.0M (BLADE) ×2 IMPLANT
BNDG ESMARK 6X9 LF (GAUZE/BANDAGES/DRESSINGS) ×2
BOWL SMART MIX CTS (DISPOSABLE) ×2 IMPLANT
CAPT KNEE TOTAL 3 ATTUNE ×2 IMPLANT
CEMENT HV SMART SET (Cement) ×4 IMPLANT
CUFF TOURN SGL QUICK 34 (TOURNIQUET CUFF) ×1
CUFF TRNQT CYL 34X4X40X1 (TOURNIQUET CUFF) ×1 IMPLANT
DECANTER SPIKE VIAL GLASS SM (MISCELLANEOUS) ×2 IMPLANT
DERMABOND ADVANCED (GAUZE/BANDAGES/DRESSINGS) ×1
DERMABOND ADVANCED .7 DNX12 (GAUZE/BANDAGES/DRESSINGS) ×1 IMPLANT
DRAPE EXTREMITY T 121X128X90 (DRAPE) ×2 IMPLANT
DRAPE POUCH INSTRU U-SHP 10X18 (DRAPES) ×2 IMPLANT
DRAPE U-SHAPE 47X51 STRL (DRAPES) ×2 IMPLANT
DRSG AQUACEL AG ADV 3.5X10 (GAUZE/BANDAGES/DRESSINGS) ×2 IMPLANT
DURAPREP 26ML APPLICATOR (WOUND CARE) ×4 IMPLANT
ELECT REM PT RETURN 9FT ADLT (ELECTROSURGICAL) ×2
ELECTRODE REM PT RTRN 9FT ADLT (ELECTROSURGICAL) ×1 IMPLANT
FACESHIELD WRAPAROUND (MASK) ×10 IMPLANT
GLOVE BIOGEL PI IND STRL 7.5 (GLOVE) ×1 IMPLANT
GLOVE BIOGEL PI IND STRL 8.5 (GLOVE) ×1 IMPLANT
GLOVE BIOGEL PI INDICATOR 7.5 (GLOVE) ×1
GLOVE BIOGEL PI INDICATOR 8.5 (GLOVE) ×1
GLOVE ECLIPSE 8.0 STRL XLNG CF (GLOVE) ×4 IMPLANT
GLOVE ORTHO TXT STRL SZ7.5 (GLOVE) ×4 IMPLANT
GOWN SPEC L3 XXLG W/TWL (GOWN DISPOSABLE) ×2 IMPLANT
GOWN STRL REUS W/TWL LRG LVL3 (GOWN DISPOSABLE) ×2 IMPLANT
HANDPIECE INTERPULSE COAX TIP (DISPOSABLE) ×1
KIT BASIN OR (CUSTOM PROCEDURE TRAY) ×2 IMPLANT
LIQUID BAND (GAUZE/BANDAGES/DRESSINGS) ×2 IMPLANT
MANIFOLD NEPTUNE II (INSTRUMENTS) ×2 IMPLANT
NDL SAFETY ECLIPSE 18X1.5 (NEEDLE) ×2 IMPLANT
NEEDLE HYPO 18GX1.5 SHARP (NEEDLE) ×2
PACK TOTAL JOINT (CUSTOM PROCEDURE TRAY) ×2 IMPLANT
POSITIONER SURGICAL ARM (MISCELLANEOUS) ×2 IMPLANT
SET HNDPC FAN SPRY TIP SCT (DISPOSABLE) ×1 IMPLANT
SET PAD KNEE POSITIONER (MISCELLANEOUS) ×2 IMPLANT
SUCTION FRAZIER 12FR DISP (SUCTIONS) ×2 IMPLANT
SUT MNCRL AB 4-0 PS2 18 (SUTURE) ×2 IMPLANT
SUT VIC AB 1 CT1 36 (SUTURE) ×2 IMPLANT
SUT VIC AB 2-0 CT1 27 (SUTURE) ×3
SUT VIC AB 2-0 CT1 TAPERPNT 27 (SUTURE) ×3 IMPLANT
SUT VLOC 180 0 24IN GS25 (SUTURE) ×2 IMPLANT
SYR 50ML LL SCALE MARK (SYRINGE) ×4 IMPLANT
TOWEL OR 17X26 10 PK STRL BLUE (TOWEL DISPOSABLE) ×2 IMPLANT
TOWEL OR NON WOVEN STRL DISP B (DISPOSABLE) IMPLANT
TRAY FOLEY CATH 14FRSI W/METER (CATHETERS) ×2 IMPLANT
WATER STERILE IRR 1500ML POUR (IV SOLUTION) ×2 IMPLANT
WRAP KNEE MAXI GEL POST OP (GAUZE/BANDAGES/DRESSINGS) ×2 IMPLANT

## 2014-03-20 NOTE — Anesthesia Postprocedure Evaluation (Signed)
  Anesthesia Post-op Note  Patient: Julie Castillo  Procedure(s) Performed: Procedure(s): RIGHT TOTAL KNEE ARTHROPLASTY (Right)  Patient Location: PACU  Anesthesia Type:Spinal  Level of Consciousness: awake  Airway and Oxygen Therapy: Patient Spontanous Breathing  Post-op Pain: mild  Post-op Assessment: Post-op Vital signs reviewed, Patient's Cardiovascular Status Stable, Respiratory Function Stable, Patent Airway, No signs of Nausea or vomiting and Pain level controlled  Post-op Vital Signs: Reviewed and stable  Last Vitals:  Filed Vitals:   03/20/14 1822  BP: 133/86  Pulse: 80  Temp: 36.6 C  Resp: 18    Complications: No apparent anesthesia complications

## 2014-03-20 NOTE — Anesthesia Procedure Notes (Signed)
Spinal  Start time: 03/20/2014 7:20 AM End time: 03/20/2014 7:27 AM Staffing Resident/CRNA: Lupita Raider F Performed by: resident/CRNA  Preanesthetic Checklist Completed: patient identified, site marked, surgical consent, pre-op evaluation, timeout performed, IV checked, risks and benefits discussed and monitors and equipment checked Spinal Block Patient position: sitting Prep: Betadine and site prepped and draped Patient monitoring: heart rate, cardiac monitor, continuous pulse ox and blood pressure Approach: midline Location: L3-4 Injection technique: single-shot Needle Needle type: Quincke  Needle gauge: 22 G Needle length: 5 cm Assessment Sensory level: T4 Additional Notes Spinocan Spinal Needle Tray Lot # [id number]  Exp. 2017/07

## 2014-03-20 NOTE — Interval H&P Note (Signed)
History and Physical Interval Note:  03/20/2014 6:58 AM  Julie Castillo  has presented today for surgery, with the diagnosis of OA right knne  The various methods of treatment have been discussed with the patient and family. After consideration of risks, benefits and other options for treatment, the patient has consented to  Procedure(s): RIGHT TOTAL KNEE ARTHROPLASTY (Right) as a surgical intervention .  The patient's history has been reviewed, patient examined, no change in status, stable for surgery.  I have reviewed the patient's chart and labs.  Questions were answered to the patient's satisfaction.     Mauri Pole

## 2014-03-20 NOTE — Progress Notes (Signed)
Utilization review completed.  

## 2014-03-20 NOTE — Transfer of Care (Signed)
Immediate Anesthesia Transfer of Care Note  Patient: Julie Castillo  Procedure(s) Performed: Procedure(s): RIGHT TOTAL KNEE ARTHROPLASTY (Right)  Patient Location: PACU  Anesthesia Type:MAC, General and Spinal  Level of Consciousness: awake, alert  and oriented  Airway & Oxygen Therapy: Patient Spontanous Breathing and Patient connected to face mask oxygen  Post-op Assessment: Report given to PACU RN and Post -op Vital signs reviewed and stable  Post vital signs: Reviewed and stable  Complications: No apparent anesthesia complications

## 2014-03-20 NOTE — Evaluation (Signed)
Physical Therapy Evaluation Patient Details Name: Julie Castillo MRN: [id number] DOB: Jul 11, 1955 Today's Date: 03/20/2014   History of Present Illness  59 yo female s/p R TKA.   Clinical Impression  On eval, pt required Min assist for mobility-able to ambulate ~4 feet with RW. Limited by pain.     Follow Up Recommendations Home health PT;Supervision/Assistance - 24 hour    Equipment Recommendations   (pt has borrowed RW)    Recommendations for Other Services OT consult     Precautions / Restrictions Precautions Precautions: Fall;Knee Restrictions Weight Bearing Restrictions: No RLE Weight Bearing: Weight bearing as tolerated      Mobility  Bed Mobility Overal bed mobility: Needs Assistance Bed Mobility: Supine to Sit;Sit to Supine     Supine to sit: Min assist Sit to supine: Min assist   General bed mobility comments: Assist for R LE.   Transfers Overall transfer level: Needs assistance Equipment used: Rolling walker (2 wheeled) Transfers: Sit to/from Stand Sit to Stand: Min assist         General transfer comment: assist to rise, stabilize, control descent. VS safety, technique, hand placement.  Ambulation/Gait Ambulation/Gait assistance: Min assist Ambulation Distance (Feet): 4 Feet Assistive device: Rolling walker (2 wheeled) Gait Pattern/deviations: Step-to pattern;Antalgic;Decreased stance time - right     General Gait Details: 4 steps forwards then backwards. VCS safety, technique, sequence. Distance limited by pain.   Stairs            Wheelchair Mobility    Modified Rankin (Stroke Patients Only)       Balance                                             Pertinent Vitals/Pain Pain Assessment: 0-10 Pain Score: 9  Pain Location: R knee Pain Descriptors / Indicators: Aching;Sore Pain Intervention(s): Limited activity within patient's tolerance;Ice applied;Repositioned    Home Living Family/patient expects to be  discharged to:: Private residence Living Arrangements: Spouse/significant other Available Help at Discharge: Family Type of Home: House Home Access: Stairs to enter Entrance Stairs-Rails: Left;Right;Can reach both Entrance Stairs-Number of Steps: 4 Home Layout: One level   Additional Comments: borrowed walkers, canes.     Prior Function Level of Independence: Independent               Hand Dominance        Extremity/Trunk Assessment   Upper Extremity Assessment: Overall WFL for tasks assessed           Lower Extremity Assessment: RLE deficits/detail RLE Deficits / Details: at least hip flex 2/5, hip abd/add 2/5, moves ankle well     Cervical / Trunk Assessment: Normal  Communication   Communication: No difficulties  Cognition Arousal/Alertness: Awake/alert Behavior During Therapy: WFL for tasks assessed/performed Overall Cognitive Status: Within Functional Limits for tasks assessed                      General Comments      Exercises        Assessment/Plan    PT Assessment Patient needs continued PT services  PT Diagnosis Difficulty walking;Acute pain   PT Problem List Decreased strength;Decreased range of motion;Decreased activity tolerance;Decreased balance;Decreased mobility;Decreased knowledge of use of DME;Pain;Decreased knowledge of precautions  PT Treatment Interventions DME instruction;Gait training;Functional mobility training;Therapeutic activities;Therapeutic exercise;Patient/family education;Balance training;Stair training   PT Goals (Current  goals can be found in the Care Plan section) Acute Rehab PT Goals Patient Stated Goal: regain independence. get back to work. PT Goal Formulation: With patient Time For Goal Achievement: 03/27/14 Potential to Achieve Goals: Good    Frequency 7X/week   Barriers to discharge        Co-evaluation               End of Session Equipment Utilized During Treatment: Gait belt Activity  Tolerance: Patient limited by pain Patient left: in bed;with call bell/phone within reach           Time: 3567-0141 PT Time Calculation (min) (ACUTE ONLY): 18 min   Charges:   PT Evaluation $Initial PT Evaluation Tier I: 1 Procedure PT Treatments $Gait Training: 8-22 mins   PT G Codes:        Weston Anna, MPT Pager: [phone]

## 2014-03-20 NOTE — Op Note (Signed)
NAME:  Julie Castillo                      MEDICAL RECORD NO.:  [id number]                             FACILITY:  [facility]      PHYSICIAN:  Pietro Cassis. Alvan Castillo, M.D.  DATE OF BIRTH:  [birth year]      DATE OF PROCEDURE:  03/20/2014                                     OPERATIVE REPORT         PREOPERATIVE DIAGNOSIS:  Right knee osteoarthritis.      POSTOPERATIVE DIAGNOSIS:  Right knee osteoarthritis.      FINDINGS:  The patient was noted to have complete loss of cartilage and   bone-on-bone arthritis with associated osteophytes in the lateral and patellofemoral compartments of   the knee with a significant synovitis and associated effusion.      PROCEDURE:  Right total knee replacement.      COMPONENTS USED:  DePuy Attune rotating platform posterior stabilized knee   system, a size 3 femur, 4 tibia, size 6 mm AOX PS insert, and 32 patellar   button.      SURGEON:  Pietro Cassis. Alvan Castillo, M.D.      ASSISTANT:  Danae Orleans, PA-C.      ANESTHESIA:  Spinal.      SPECIMENS:  None.      COMPLICATION:  None.      DRAINS:  None.  EBL: <100cc      TOURNIQUET TIME:   Total Tourniquet Time Documented: Thigh (Right) - 30 minutes Total: Thigh (Right) - 30 minutes  .      The patient was stable to the recovery room.      INDICATION FOR PROCEDURE:  Julie Castillo is a 59 y.o. female patient of   mine.  The patient had been seen, evaluated, and treated conservatively in the   office with medication, activity modification, and injections.  The patient had   radiographic changes of bone-on-bone arthritis with endplate sclerosis and osteophytes noted.      The patient failed conservative measures including medication, injections, and activity modification, and at this point was ready for more definitive measures.   Based on the radiographic changes and failed conservative measures, the patient   decided to proceed with total knee replacement.  Risks of infection,   DVT, component failure, need for  revision surgery, postop course, and   expectations were all   discussed and reviewed.  Consent was obtained for benefit of pain   relief.      PROCEDURE IN DETAIL:  The patient was brought to the operative theater.   Once adequate anesthesia, preoperative antibiotics, 2 gm of Ancef, 1 gm of Tranexamic Acid, and 10mg  of Decadron administered, the patient was positioned supine with the right thigh tourniquet placed.  The  right lower extremity was prepped and draped in sterile fashion.  A time-   out was performed identifying the patient, planned procedure, and   extremity.      The right lower extremity was placed in the [facility] leg holder.  The leg was   exsanguinated, tourniquet elevated to 250 mmHg.  A midline incision was   made followed by  median parapatellar arthrotomy.  Following initial   exposure, attention was first directed to the patella.  Precut   measurement was noted to be 20 mm.  I resected down to 14 mm and used a   32 patellar button to restore patellar height as well as cover the cut   surface.      The lug holes were drilled and a metal shim was placed to protect the   patella from retractors and saw blades.      At this point, attention was now directed to the femur.  The femoral   canal was opened with a drill, irrigated to try to prevent fat emboli.  An   intramedullary rod was passed at 3 degrees valgus, 9 mm of bone was   resected off the distal femur.  Following this resection, the tibia was   subluxated anteriorly.  Using the extramedullary guide, 2 mm of bone was resected off   the proximal lateral tibia.  We confirmed the gap would be   stable medially and laterally with a size 6 insert as well as confirmed   the cut was perpendicular in the coronal plane, checking with an alignment rod.      Once this was done, I sized the femur to be a size 3 in the anterior-   posterior dimension, chose a standard component based on medial and   lateral dimension.  The  size 3 rotation block was then pinned in   position anterior referenced using the C-clamp to set rotation.  The   anterior, posterior, and  chamfer cuts were made without difficulty nor   notching making certain that I was along the anterior cortex to help   with flexion gap stability.      The final box cut was made off the lateral aspect of distal femur.      At this point, the tibia was sized to be a size 4, the size 4 tray was   then pinned in position through the medial third of the tubercle,   drilled, and keel punched.  Trial reduction was now carried with a 3 femur,  4 tibia, a size 6 mm insert, and the 32 patella botton.  The knee was brought to   extension, full extension with good flexion stability with the patella   tracking through the trochlea without application of pressure.  Given   all these findings, the trial components removed.  Final components were   opened and cement was mixed.  The knee was irrigated with normal saline   solution and pulse lavage.  The synovial lining was   then injected with 30cc of 0.25% Marcaine with epinephrine and 1 cc of Toradol plus 30cc of NS  total of 61 cc.      The knee was irrigated.  Final implants were then cemented onto clean and   dried cut surfaces of bone with the knee brought to extension with a 6   mm trial insert.      Once the cement had fully cured, the excess cement was removed   throughout the knee.  I confirmed I was satisfied with the range of   motion and stability, and the final size 6 mm PS AOX insert was chosen.  It was   placed into the knee.      The tourniquet had been let down at 30 minutes.  No significant   hemostasis required.  The   extensor mechanism was then reapproximated  using #1 Vicryl and #0 V-lock sutures with the knee   in flexion.  The   remaining wound was closed with 2-0 Vicryl and running 4-0 Monocryl.   The knee was cleaned, dried, dressed sterilely using Dermabond and   Aquacel dressing.   The patient was then   brought to recovery room in stable condition, tolerating the procedure   well.   Please note that Physician Assistant, Danae Orleans, PA-C, was present for the entirety of the case, and was utilized for pre-operative positioning, peri-operative retractor management, general facilitation of the procedure.  He was also utilized for primary wound closure at the end of the case.              Pietro Cassis Alvan Castillo, M.D.    03/20/2014 8:48 AM

## 2014-03-20 NOTE — Anesthesia Preprocedure Evaluation (Signed)
Anesthesia Evaluation  Patient identified by MRN, date of birth, ID band Patient awake    Reviewed: Allergy & Precautions, NPO status , Patient's Chart, lab work & pertinent test results  History of Anesthesia Complications Negative for: history of anesthetic complications  Airway Mallampati: II  TM Distance: >3 FB Neck ROM: Full    Dental  (+) Teeth Intact   Pulmonary neg pulmonary ROS,  breath sounds clear to auscultation        Cardiovascular hypertension, Pt. on medications - angina- Past MI, - CABG and - CHF Rhythm:Regular     Neuro/Psych negative neurological ROS  negative psych ROS   GI/Hepatic Neg liver ROS, GERD-  Medicated and Controlled,  Endo/Other  Hypothyroidism   Renal/GU negative Renal ROS     Musculoskeletal  (+) Arthritis -, Osteoarthritis,    Abdominal   Peds  Hematology   Anesthesia Other Findings   Reproductive/Obstetrics                             Anesthesia Physical Anesthesia Plan  ASA: II  Anesthesia Plan: Spinal   Post-op Pain Management:    Induction: Intravenous  Airway Management Planned: Natural Airway and Nasal Cannula  Additional Equipment: None  Intra-op Plan:   Post-operative Plan:   Informed Consent: I have reviewed the patients History and Physical, chart, labs and discussed the procedure including the risks, benefits and alternatives for the proposed anesthesia with the patient or authorized representative who has indicated his/her understanding and acceptance.   Dental advisory given  Plan Discussed with: Surgeon and CRNA  Anesthesia Plan Comments:         Anesthesia Quick Evaluation

## 2014-03-21 LAB — BASIC METABOLIC PANEL
ANION GAP: 8 (ref 5–15)
BUN: 17 mg/dL (ref 6–23)
CO2: 28 mmol/L (ref 19–32)
Calcium: 9 mg/dL (ref 8.4–10.5)
Chloride: 101 mmol/L (ref 96–112)
Creatinine, Ser: 0.73 mg/dL (ref 0.50–1.10)
GFR calc Af Amer: >90 mL/min (ref >90)
GFR calc non Af Amer: >90 mL/min (ref >90)
GLUCOSE: 118 mg/dL — AB (ref 70–99)
Potassium: 3.9 mmol/L (ref 3.5–5.1)
Sodium: 137 mmol/L (ref 135–145)

## 2014-03-21 LAB — CBC
HEMATOCRIT: 32.7 % — AB (ref 36.0–46.0)
Hemoglobin: 10.6 g/dL — ABNORMAL LOW (ref 12.0–15.0)
MCH: 27.5 pg (ref 26.0–34.0)
MCHC: 32.4 g/dL (ref 30.0–36.0)
MCV: 84.7 fL (ref 78.0–100.0)
PLATELETS: 234 10*3/uL (ref 150–400)
RBC: 3.86 MIL/uL — AB (ref 3.87–5.11)
RDW: 13.8 % (ref 11.5–15.5)
WBC: 10.7 10*3/uL — AB (ref 4.0–10.5)

## 2014-03-21 MED ORDER — HYDROCODONE-ACETAMINOPHEN 7.5-325 MG PO TABS: 1.0000 | ORAL_TABLET | ORAL | Status: DC | PRN

## 2014-03-21 MED ORDER — OXYCODONE HCL 5 MG PO TABS
5.0000 mg | ORAL_TABLET | ORAL | Status: DC
  Administered 2014-03-21 (×3): 10 mg via ORAL
  Administered 2014-03-22 (×3): 15 mg via ORAL
  Filled 2014-03-21 (×2): qty 3
  Filled 2014-03-21 (×3): qty 2
  Filled 2014-03-21: qty 3

## 2014-03-21 MED ORDER — POLYETHYLENE GLYCOL 3350 17 G PO PACK: 17.0000 g | PACK | Freq: Two times a day (BID) | ORAL | Status: DC

## 2014-03-21 MED ORDER — METHOCARBAMOL 500 MG PO TABS: 500.0000 mg | ORAL_TABLET | Freq: Four times a day (QID) | ORAL | Status: DC | PRN

## 2014-03-21 MED ORDER — DOCUSATE SODIUM 100 MG PO CAPS: 100.0000 mg | ORAL_CAPSULE | Freq: Two times a day (BID) | ORAL | Status: DC

## 2014-03-21 MED ORDER — FERROUS SULFATE 325 (65 FE) MG PO TABS: 325.0000 mg | ORAL_TABLET | Freq: Three times a day (TID) | ORAL | Status: DC

## 2014-03-21 MED ORDER — ASPIRIN 325 MG PO TBEC: 325.0000 mg | DELAYED_RELEASE_TABLET | Freq: Two times a day (BID) | ORAL | Status: DC

## 2014-03-21 MED ORDER — LIP MEDEX EX OINT
TOPICAL_OINTMENT | CUTANEOUS | Status: AC
Start: 2014-03-21 — End: 2014-03-21
  Administered 2014-03-21: 14:00:00
  Filled 2014-03-21: qty 7

## 2014-03-21 MED ORDER — ACETAMINOPHEN 325 MG PO TABS: 325.0000 mg | ORAL_TABLET | Freq: Four times a day (QID) | ORAL | Status: DC | PRN

## 2014-03-21 NOTE — Progress Notes (Signed)
Physical Therapy Treatment Patient Details Name: Julie Castillo MRN: [id number] DOB: Aug 21, 1955 Today's Date: 03/21/2014    History of Present Illness 59 yo female s/p R TKA.     PT Comments    Pt complaining of significant pain R ankle and pt also has difficulty with DF. Pt also vomiting. Deferred mobilizing but pt was able to perform exercises. Made RN aware.   Follow Up Recommendations  Home health PT;Supervision/Assistance - 24 hour     Equipment Recommendations       Recommendations for Other Services       Precautions / Restrictions Precautions Precautions: Knee;Fall Restrictions Weight Bearing Restrictions: No RLE Weight Bearing: Weight bearing as tolerated    Mobility  Bed Mobility                Transfers            Ambulation/Gait                 Stairs            Wheelchair Mobility    Modified Rankin (Stroke Patients Only)       Balance                                    Cognition Arousal/Alertness: Lethargic;Suspect due to medications Behavior During Therapy: [facility] for tasks assessed/performed Overall Cognitive Status: Within Functional Limits for tasks assessed                      Exercises Total Joint Exercises Ankle Circles/Pumps: AAROM;Right;10 reps;Seated Quad Sets: AROM;Both;10 reps;Seated Heel Slides: AAROM;Right;10 reps;Seated Hip ABduction/ADduction: AAROM;Right;10 reps;Seated Straight Leg Raises: AAROM;Right;10 reps;Seated Goniometric ROM: 10-45 degrees    General Comments        Pertinent Vitals/Pain Pain Assessment: 0-10 Pain Score: 10-Worst pain ever Pain Location: R ankle Pain Descriptors / Indicators: Burning Pain Intervention(s): Limited activity within patient's tolerance;Repositioned (made RN aware)    Home Living Family/patient expects to be discharged to:: Private residence Living Arrangements: Spouse/significant other Available Help at Discharge: Family            Additional Comments: pt bought an elevated toilet without rails.  She can borrow a tub seat and grab bar but will sponge bathe initially until she can step in.      Prior Function Level of Independence: Independent          PT Goals (current goals can now be found in the care plan section) Acute Rehab PT Goals Patient Stated Goal: regain independence. get back to work. Progress towards PT goals: Not progressing toward goals - comment (drowsy/lethargic, vomiting)    Frequency  7X/week    PT Plan Current plan remains appropriate    Co-evaluation             End of Session   Activity Tolerance: Patient limited by pain;Patient limited by lethargy (Limited by nausea/vomiting) Patient left: in chair;with call bell/phone within reach     Time: 1127-1148 PT Time Calculation (min) (ACUTE ONLY): 21 min  Charges:  $Therapeutic Exercise: 8-22 mins                    G Codes:      Weston Anna, MPT Pager: [phone]

## 2014-03-21 NOTE — Plan of Care (Signed)
Problem: Consults Goal: Diagnosis- Total Joint Replacement Outcome: Completed/Met Date Met:  03/21/14 Primary Total Knee Right     

## 2014-03-21 NOTE — Progress Notes (Signed)
Patient c/o severe pain to right ankle and foot, also unable to dorsi-flex right foot at this time.  Patient has father who had blood clot to right foot and patient concerned same is happening to her.  Notified [employer] PA of above, order to change pain med was done by PA and patient notified she would be seen during MD/PA rounds in the AM.  Educated patient on signs/symptoms of DVT and encouraged patient to "relax".  During next round, patient's pain controlled better with oxycodone and patient stated she was feeling much better.  Will continue to monitor closely.  Christen Bame RN

## 2014-03-21 NOTE — Progress Notes (Signed)
Physical Therapy Treatment Patient Details Name: Julie Castillo MRN: [id number] DOB: May 19, 1955 Today's Date: 03/21/2014    History of Present Illness 59 yo female s/p R TKA.     PT Comments    Improved alertness, activity tolerance and mobility this session. Mobility is still limited by pain but pt was able to progress activity-walked ~48 feet with RW with overall Min assist. Will plan to practice steps on tomorrow with husband present.   Follow Up Recommendations  Home health PT;Supervision/Assistance - 24 hour     Equipment Recommendations  None recommended by PT    Recommendations for Other Services OT consult     Precautions / Restrictions Precautions Precautions: Knee;Fall Restrictions Weight Bearing Restrictions: No RLE Weight Bearing: Weight bearing as tolerated    Mobility  Bed Mobility Overal bed mobility: Needs Assistance Bed Mobility: Supine to Sit;Sit to Supine     Supine to sit: Min assist Sit to supine: Min assist   General bed mobility comments: assist for RLE  Transfers Overall transfer level: Needs assistance Equipment used: Rolling walker (2 wheeled) Transfers: Sit to/from Stand Sit to Stand: Min assist         General transfer comment: assist to rise and steady. Cues for UE/LE placement  Ambulation/Gait Ambulation/Gait assistance: Min assist Ambulation Distance (Feet): 48 Feet Assistive device: Rolling walker (2 wheeled) Gait Pattern/deviations: Step-to pattern;Decreased stride length;Antalgic;Trunk flexed;Decreased stance time - right     General Gait Details: close guard mostly but intermittent assist to steady. VCs safety, technique, sequence. Distance limited by pain.    Stairs            Wheelchair Mobility    Modified Rankin (Stroke Patients Only)       Balance                                    Cognition Arousal/Alertness: Awake/alert Behavior During Therapy: WFL for tasks  assessed/performed Overall Cognitive Status: Within Functional Limits for tasks assessed                      Exercises Total Joint Exercises Ankle Circles/Pumps: AROM;Both;10 reps;Supine (decreased ROM on R but pt able to actively perform) Quad Sets: AROM;Both;10 reps;Seated Heel Slides: AAROM;Right;10 reps;Seated Hip ABduction/ADduction: AAROM;Right;10 reps;Seated Straight Leg Raises: AAROM;Right;10 reps;Seated Goniometric ROM: 10-45 degrees    General Comments        Pertinent Vitals/Pain Pain Assessment: 0-10 Pain Score: 7  Pain Location: R LE Pain Descriptors / Indicators: Aching;Sharp;Sore Pain Intervention(s): Monitored during session;Limited activity within patient's tolerance;Premedicated before session;Ice applied    Home Living                      Prior Function            PT Goals (current goals can now be found in the care plan section) Progress towards PT goals: Progressing toward goals (slowly)    Frequency  7X/week    PT Plan Current plan remains appropriate    Co-evaluation             End of Session Equipment Utilized During Treatment: Gait belt Activity Tolerance: Patient limited by pain Patient left: in bed;with call bell/phone within reach     Time: 1446-1510 PT Time Calculation (min) (ACUTE ONLY): 24 min  Charges:  $Gait Training: 8-22 mins $Therapeutic Exercise: 8-22 mins $Therapeutic Activity: 8-22 mins  G Codes:      Weston Anna, MPT Pager: [phone]

## 2014-03-21 NOTE — Care Management Note (Signed)
    Page 1 of 2   03/21/2014     3:00:45 PM CARE MANAGEMENT NOTE 03/21/2014  Patient:  Julie Castillo, Julie Castillo   Account Number:  [id number]  Date Initiated:  03/21/2014  Documentation initiated by:  [facility]  Subjective/Objective Assessment:   adm: RIGHT TOTAL KNEE ARTHROPLASTY (Right)     Action/Plan:   discharge planning   Anticipated DC Date:  03/22/2014   Anticipated DC Plan:  [place]  CM consult      [facility] Choice  DURABLE MEDICAL EQUIPMENT   Choice offered to / List presented to:  C-1 Patient   DME arranged  Vassie Moselle      DME agency  [place] arranged  HH-2 PT      [place] agency  OTHER - SEE NOTE   Status of service:  Completed, signed off Medicare Important Message given?   (If response is "NO", the following Medicare IM given date fields will be blank) Date Medicare IM given:   Medicare IM given by:   Date Additional Medicare IM given:   Additional Medicare IM given by:    Discharge Disposition:  [place]  Per UR Regulation:    If discussed at Long Length of Stay Meetings, dates discussed:    Comments:  03/21/14 09:15 CM met witnh pt in room to offer choice of home health agency.  Pt states she understands she will be out of network with her insurance as she lives outside of [place] and chooses [facility] in [place].  CM spoke with April of [facility] who requested I fax facesheet, orders, face to face, PT EVAL and op note to [phone].  CM called to ensure all information needed was received and [facility] assures they have everything needed and SOC will be Friday 03/23/14.  CM called [facility] DME rep, Lecretia to please deliver rolling walker to room prior to discharge. No other CM needs were communicated.  Mariane Masters, BSN, CM [phone].

## 2014-03-21 NOTE — Evaluation (Signed)
Occupational Therapy Evaluation Patient Details Name: Julie Castillo MRN: [id number] DOB: [birth year] Today's Date: 03/21/2014    History of Present Illness 59 yo female s/p R TKA.    Clinical Impression   Pt was admitted for the above surgery. She will benefit from skilled OT to increase safety and independence with adls, educate husband how to assist with ADLs and further assess for bathroom DME.  Pt was independent with adls prior to admission.  Toilet goal  In acute is for min guard level:  Pt is currently moving at min A level but limited by pain.     Follow Up Recommendations  Home health OT    Equipment Recommendations   (to be further assessed for 3:1; has elevated toilet seat)    Recommendations for Other Services       Precautions / Restrictions Precautions Precautions: Fall;Knee Restrictions Weight Bearing Restrictions: No RLE Weight Bearing: Weight bearing as tolerated      Mobility Bed Mobility         Supine to sit: Min assist     General bed mobility comments: assist for RLE  Transfers   Equipment used: Rolling walker (2 wheeled) Transfers: Sit to/from Stand Sit to Stand: Min assist         General transfer comment: assist to rise and steady. Cues for UE/LE placement    Balance                                            ADL Overall ADL's : Needs assistance/impaired     Grooming: Set up;Sitting   Upper Body Bathing: Set up;Sitting   Lower Body Bathing: Moderate assistance;Sit to/from stand   Upper Body Dressing : Set up;Sitting   Lower Body Dressing: Maximal assistance;Sit to/from stand   Toilet Transfer: Minimal assistance;Stand-pivot (to recliner)             General ADL Comments: Performed ADL from EOB.  Pt limited by pain.  She has an elevated toilet seat with counter next to it.  Will need to try  19" surface to see if she can manage this.  Pain is an issue right now, and she may need 3:1 due to activity  tolerance limitation with pain.  Pt will sponge bathe intially and wash hair in the sink.  She can borrow seat and clamp on bar for edge of tub as she is able to tolerate more weight on RLE     Vision                     Perception     Praxis      Pertinent Vitals/Pain Pain Score: 9  Pain Location: R knee and thigh Pain Descriptors / Indicators: Aching Pain Intervention(s): Limited activity within patient's tolerance     Hand Dominance     Extremity/Trunk Assessment Upper Extremity Assessment Upper Extremity Assessment: Overall WFL for tasks assessed           Communication Communication Communication: No difficulties   Cognition Arousal/Alertness: Awake/alert Behavior During Therapy: WFL for tasks assessed/performed Overall Cognitive Status: Within Functional Limits for tasks assessed                     General Comments       Exercises       Shoulder Instructions  Home Living Family/patient expects to be discharged to:: Private residence Living Arrangements: Spouse/significant other Available Help at Discharge: Family               Bathroom Shower/Tub: Tub/shower unit Shower/tub characteristics: Curtain Biochemist, clinical: Standard         Additional Comments: pt bought an elevated toilet without rails.  She can borrow a tub seat and grab bar but will sponge bathe initially until she can step in.        Prior Functioning/Environment Level of Independence: Independent             OT Diagnosis: Generalized weakness   OT Problem List: Decreased strength;Decreased activity tolerance;Pain;Decreased knowledge of use of DME or AE   OT Treatment/Interventions: Self-care/ADL training;DME and/or AE instruction;Patient/family education    OT Goals(Current goals can be found in the care plan section) Acute Rehab OT Goals Patient Stated Goal: regain independence. get back to work. OT Goal Formulation: With patient Time For Goal  Achievement: 03/28/14 Potential to Achieve Goals: Good ADL Goals Pt Will Transfer to Toilet: with min guard assist;ambulating;bedside commode (vs 19" surface for elevated toilet seat with counter next ) Pt Will Perform Toileting - Clothing Manipulation and hygiene: with min guard assist;sit to/from stand Additional ADL Goal #1: husband will verbalize comfort vs demonstrate assist for adls Additional ADL Goal #2: Pt will use reacher for adls with cues  OT Frequency: Min 2X/week   Barriers to D/C:            Co-evaluation              End of Session    Activity Tolerance: Patient tolerated treatment well Patient left: in chair;with call bell/phone within reach   Time: 1013-1046 OT Time Calculation (min): 33 min Charges:  OT General Charges $OT Visit: 1 Procedure OT Evaluation $Initial OT Evaluation Tier I: 1 Procedure OT Treatments $Self Care/Home Management : 8-22 mins G-Codes:    Shalan Neault 11-Apr-2014, 11:20 AM  Lesle Chris, OTR/L [phone] 04-11-2014

## 2014-03-21 NOTE — Progress Notes (Addendum)
     Subjective: 1 Day Post-Op Procedure(s) (LRB): RIGHT TOTAL KNEE ARTHROPLASTY (Right)   Patient reports pain as moderate, pain not fully controlled. No events throughout the night . Ready to be discharged home if pain stays controlled and she does well with PT.  Objective:   VITALS:   Filed Vitals:   03/21/14 0654  BP: 128/68  Pulse: 75  Temp: 98.3 F (36.8 C)  Resp: 18    Dorsiflexion/Plantar flexion intact Incision: dressing C/D/I No cellulitis present Compartment soft  LABS  Recent Labs  03/21/14 0518  HGB 10.6*  HCT 32.7*  WBC 10.7*  PLT 234     Recent Labs  03/21/14 0518  NA 137  K 3.9  BUN 17  CREATININE 0.73  GLUCOSE 118*     Assessment/Plan: 1 Day Post-Op Procedure(s) (LRB): RIGHT TOTAL KNEE ARTHROPLASTY (Right) Changed to Oxycodone from [place]. Foley cath d/c'ed Advance diet Up with therapy D/C IV fluids Discharge home with home health  Follow up in 2 weeks at [facility] [place]. Follow up with OLIN,Elania Crowl D in 2 weeks.  Contact information:  [facility] [address] [place] Calisa Luckenbaugh   PAC  03/21/2014, 9:29 AM

## 2014-03-22 LAB — BASIC METABOLIC PANEL
Anion gap: 8 (ref 5–15)
BUN: 13 mg/dL (ref 6–23)
CALCIUM: 9.1 mg/dL (ref 8.4–10.5)
CO2: 34 mmol/L — AB (ref 19–32)
CREATININE: 0.69 mg/dL (ref 0.50–1.10)
Chloride: 98 mmol/L (ref 96–112)
Glucose, Bld: 93 mg/dL (ref 70–99)
POTASSIUM: 3.5 mmol/L (ref 3.5–5.1)
Sodium: 140 mmol/L (ref 135–145)

## 2014-03-22 LAB — CBC
HEMATOCRIT: 33.5 % — AB (ref 36.0–46.0)
Hemoglobin: 11 g/dL — ABNORMAL LOW (ref 12.0–15.0)
MCH: 28 pg (ref 26.0–34.0)
MCHC: 32.8 g/dL (ref 30.0–36.0)
MCV: 85.2 fL (ref 78.0–100.0)
PLATELETS: 245 10*3/uL (ref 150–400)
RBC: 3.93 MIL/uL (ref 3.87–5.11)
RDW: 13.8 % (ref 11.5–15.5)
WBC: 9.7 10*3/uL (ref 4.0–10.5)

## 2014-03-22 MED ORDER — ACETAMINOPHEN 325 MG PO TABS: 325.0000 mg | ORAL_TABLET | Freq: Four times a day (QID) | ORAL | Status: DC | PRN

## 2014-03-22 MED ORDER — ASPIRIN 325 MG PO TBEC: 325.0000 mg | DELAYED_RELEASE_TABLET | Freq: Two times a day (BID) | ORAL | Status: AC

## 2014-03-22 MED ORDER — OXYCODONE HCL 5 MG PO TABS: 5.0000 mg | ORAL_TABLET | ORAL | Status: DC | PRN

## 2014-03-22 NOTE — Plan of Care (Signed)
Problem: Phase III Progression Outcomes Goal: Anticoagulant follow-up in place Outcome: Not Applicable Date Met:  00/34/91 ASA for VTE, no f/u needed.

## 2014-03-22 NOTE — Progress Notes (Signed)
Occupational Therapy Treatment Patient Details Name: Julie Castillo MRN: [id number] DOB: 1955/06/10 Today's Date: 03/22/2014    History of present illness 59 yo female s/p R TKA.    OT comments  Pt limited by 7/10 pain most of session and fatigued after performing bathing/dressing but overall did well with tasks. Husband present at end of session for education. Practiced with AE for LB dressing and practiced on and off 3in1. She would benefit from 3in1 rather than her riser and only a counter to assist up and down. She is safer with use of UE supports of 3in1. Will follow and recommend HHOT to reinforce education further.    Follow Up Recommendations  Home health OT;Supervision/Assistance - 24 hour    Equipment Recommendations  3 in 1 bedside comode    Recommendations for Other Services      Precautions / Restrictions Precautions Precautions: Knee;Fall Restrictions Weight Bearing Restrictions: No RLE Weight Bearing: Weight bearing as tolerated       Mobility Bed Mobility   Bed Mobility: Supine to Sit     Supine to sit: Min guard        Transfers Overall transfer level: Needs assistance Equipment used: Rolling walker (2 wheeled) Transfers: Sit to/from Stand Sit to Stand: Min assist         General transfer comment: assist to rise and steady. min cues for hand placement and LE management.    Balance                                   ADL                       Lower Body Dressing: Minimal assistance;With adaptive equipment;Sit to/from stand   Toilet Transfer: Minimal assistance;Ambulation;BSC;RW             General ADL Comments: Husband present for end of session. Educated pt and spouse on AE options for LB self care and pt is interested in obtaining kit from gift shop. She used reacher to don underwear and pants and sock aid to don socks. Pt fatigued and with 7/10 pain with dressing task so educated on pacing self and taking plenty  of rest breaks. SPouse verbalized understanding of all education and how to provide steady assist with clothing management. Pt will sponge bathe initially and educated on use of 3in1 as seat at sink to bathe. Pt has a riser but only a counter to push up with it and educated on 3in1 with armrests and use as BSC and pt would like to get a 3in1 rather than using her riser. Informed case Freight forwarder. Explained how to adjust for appropriate height also.       Vision                     Perception     Praxis      Cognition   Behavior During Therapy: [facility] for tasks assessed/performed Overall Cognitive Status: Within Functional Limits for tasks assessed                       Extremity/Trunk Assessment               Exercises     Shoulder Instructions       General Comments      Pertinent Vitals/ Pain       Pain Assessment:  0-10 Pain Score: 7  Pain Location: R knee Pain Descriptors / Indicators: Aching Pain Intervention(s): Repositioned;Ice applied;Monitored during session  Home Living                                          Prior Functioning/Environment              Frequency Min 2X/week     Progress Toward Goals  OT Goals(current goals can now be found in the care plan section)  Progress towards OT goals: Progressing toward goals     Plan Discharge plan remains appropriate    Co-evaluation                 End of Session Equipment Utilized During Treatment: Rolling walker   Activity Tolerance Patient limited by pain;Patient limited by fatigue   Patient Left in chair;with call bell/phone within reach   Nurse Communication          Time: [id number] OT Time Calculation (min): 48 min  Charges: OT General Charges $OT Visit: 1 Procedure OT Treatments $Self Care/Home Management : 23-37 mins $Therapeutic Activity: 8-22 mins  [name]  [phone] 03/22/2014, 11:03 AM

## 2014-03-22 NOTE — Progress Notes (Signed)
Physical Therapy Treatment Patient Details Name: Julie Castillo MRN: [id number] DOB: [birth year] Today's Date: 03/22/2014    History of Present Illness 59 yo female s/p R TKA.     PT Comments    Progressing slowly with mobility. 2nd session to progress ambulation. Pt was able to walk ~75 feet with Min guard assist. Pt reports feeling generally fatigued. All education completed. No further questions from pt/husband.   Follow Up Recommendations  Home health PT;Supervision/Assistance - 24 hour     Equipment Recommendations  None recommended by PT    Recommendations for Other Services OT consult     Precautions / Restrictions Precautions Precautions: Knee;Fall Restrictions Weight Bearing Restrictions: No RLE Weight Bearing: Weight bearing as tolerated    Mobility  Bed Mobility   Bed Mobility: Supine to Sit     Supine to sit: Min guard     General bed mobility comments: oob in recliner  Transfers Overall transfer level: Needs assistance Equipment used: Rolling walker (2 wheeled) Transfers: Sit to/from Stand Sit to Stand: Min guard         General transfer comment: close guard for safety. VCs safety, hand placmeent, LE placement  Ambulation/Gait Ambulation/Gait assistance: Min guard Ambulation Distance (Feet): 75 Feet Assistive device: Rolling walker (2 wheeled) Gait Pattern/deviations: Step-to pattern;Decreased stride length;Antalgic;Trunk flexed     General Gait Details: close guard. VCs safety, technique, sequence. Distance limited by pain, fatigue.    Stairs Stairs: Yes Stairs assistance: Min assist Stair Management: Step to pattern;Forwards;Two rails Number of Stairs: 5 General stair comments: VCS safety, technique, sequence. Assist to stabilize. Husband present during session and practiced with pt as well. Seated rest break needed (pt went up and over once, rested, then up and over again)  Wheelchair Mobility    Modified Rankin (Stroke Patients  Only)       Balance                                    Cognition Arousal/Alertness: Awake/alert Behavior During Therapy: WFL for tasks assessed/performed Overall Cognitive Status: Within Functional Limits for tasks assessed                      Exercises Total Joint Exercises Ankle Circles/Pumps: AROM;Both;10 reps;Seated;AAROM (difficulty DFing again on today R ankle) Quad Sets: AROM;Both;10 reps;Seated Heel Slides: AAROM;Right;10 reps;Seated Hip ABduction/ADduction: AAROM;Right;10 reps;Seated Straight Leg Raises: AAROM;Right;10 reps;Seated Goniometric ROM: 10-50 degrees    General Comments General comments (skin integrity, edema, etc.): min assist balance to pull up clothing.      Pertinent Vitals/Pain Pain Assessment: 0-10 Pain Score: 5  Pain Location: R knee Pain Descriptors / Indicators: Aching;Sore Pain Intervention(s): Monitored during session;Premedicated before session;Ice applied    Home Living                      Prior Function            PT Goals (current goals can now be found in the care plan section) Progress towards PT goals: Progressing toward goals (slowly)    Frequency  7X/week    PT Plan Current plan remains appropriate    Co-evaluation             End of Session Equipment Utilized During Treatment: Gait belt Activity Tolerance: Patient limited by fatigue Patient left: in chair;with call bell/phone within reach;with family/visitor present     Time:  2336-1224 PT Time Calculation (min) (ACUTE ONLY): 21 min  Charges:  $Gait Training: 8-22 mins $Therapeutic Exercise: 8-22 mins                    G Codes:      Julie Castillo, MPT Pager: [phone]

## 2014-03-22 NOTE — Progress Notes (Signed)
Pt to d/c home. AVS reviewed and "My Chart" discussed with pt. Pt capable of verbalizing medications, signs and symptoms of infection, and follow-up appointments. Remains hemodynamically stable. No signs and symptoms of distress. Educated pt to return to ER in the case of SOB, dizziness, or chest pain.  

## 2014-03-22 NOTE — Progress Notes (Signed)
     Subjective: 2 Days Post-Op Procedure(s) (LRB): RIGHT TOTAL KNEE ARTHROPLASTY (Right)   Patient reports pain as mild, pain more controlled today than yesterday. No events throughout the night. Ready to be discharged home.  Objective:   VITALS:   Filed Vitals:   03/22/14  BP: 143/83  Pulse: 94  Temp: 98.3 F (36.8 C)   Resp: 16    Dorsiflexion/Plantar flexion intact Incision: dressing C/D/I No cellulitis present Compartment soft  LABS  Recent Labs  03/21/14 0518 03/22/14 0532  HGB 10.6* 11.0*  HCT 32.7* 33.5*  WBC 10.7* 9.7  PLT 234 245     Recent Labs  03/21/14 0518 03/22/14 0532  NA 137 140  K 3.9 3.5  BUN 17 13  CREATININE 0.73 0.69  GLUCOSE 118* 93     Assessment/Plan: 2 Days Post-Op Procedure(s) (LRB): RIGHT TOTAL KNEE ARTHROPLASTY (Right) Up with therapy Discharge home with home health  Follow up in 2 weeks at [facility]. Follow up with OLIN,Torry Istre D in 2 weeks.  Contact information:  [facility] [address], [address] [place]   PAC  03/22/2014, 1:18 PM

## 2014-03-22 NOTE — Progress Notes (Addendum)
Physical Therapy Treatment Patient Details Name: DAYZEE TROWER MRN: [id number] DOB: 1955/08/02 Today's Date: 03/22/2014    History of Present Illness 59 yo female s/p R TKA.     PT Comments    Progressing slowly with mobility. Noted decreased/impaired R ankle DF during exercises and ambulation again on today. Made RN aware on yesterday. Instructed pt to make MD aware as well. Possible d/c later today. Will plan to see a 2nd time. Vitals during session: BP105/62, 95% RA, 87 bpm  Follow Up Recommendations  Home health PT;Supervision/Assistance - 24 hour     Equipment Recommendations  None recommended by PT    Recommendations for Other Services OT consult     Precautions / Restrictions Precautions Precautions: Knee;Fall Restrictions Weight Bearing Restrictions: No RLE Weight Bearing: Weight bearing as tolerated    Mobility  Bed Mobility   Bed Mobility: Supine to Sit     Supine to sit: Min guard     General bed mobility comments: oob in recliner  Transfers Overall transfer level: Needs assistance Equipment used: Rolling walker (2 wheeled) Transfers: Sit to/from Stand Sit to Stand: Min assist         General transfer comment: assist to rise and steady. min cues for hand placement and LE management.  Ambulation/Gait Ambulation/Gait assistance: Min assist Ambulation Distance (Feet): 50 Feet Assistive device: Rolling walker (2 wheeled) Gait Pattern/deviations: Step-to pattern;Decreased stride length;Trunk flexed;Antalgic;Decreased stance time - right     General Gait Details: close guard mostly but intermittent assist to steady. VCs safety, technique, sequence. Distance limited by pain, fatigue.    Stairs Stairs: Yes Stairs assistance: Min assist Stair Management: Step to pattern;Forwards;Two rails Number of Stairs: 5 General stair comments: VCS safety, technique, sequence. Assist to stabilize. Husband present during session and practiced with pt as well.  Seated rest break needed (pt went up and over once, rested, then up and over again)  Wheelchair Mobility    Modified Rankin (Stroke Patients Only)       Balance                                    Cognition Arousal/Alertness: Awake/alert Behavior During Therapy: WFL for tasks assessed/performed Overall Cognitive Status: Within Functional Limits for tasks assessed                      Exercises Total Joint Exercises Ankle Circles/Pumps: AROM;Both;10 reps;Seated;AAROM (difficulty DFing again on today R ankle) Quad Sets: AROM;Both;10 reps;Seated Heel Slides: AAROM;Right;10 reps;Seated Hip ABduction/ADduction: AAROM;Right;10 reps;Seated Straight Leg Raises: AAROM;Right;10 reps;Seated Goniometric ROM: 10-50 degrees    General Comments General comments (skin integrity, edema, etc.): min assist balance to pull up clothing.      Pertinent Vitals/Pain Pain Assessment: 0-10 Pain Score: 5  Pain Location: R knee Pain Descriptors / Indicators: Sore;Aching Pain Intervention(s): Monitored during session;Repositioned;Ice applied    Home Living                      Prior Function            PT Goals (current goals can now be found in the care plan section) Progress towards PT goals: Progressing toward goals (slowly)    Frequency  7X/week    PT Plan Current plan remains appropriate    Co-evaluation             End of Session Equipment Utilized  During Treatment: Gait belt Activity Tolerance: Patient limited by fatigue;Patient limited by pain Patient left: in chair;with call bell/phone within reach;with family/visitor present     Time: 4935-5217 PT Time Calculation (min) (ACUTE ONLY): 37 min  Charges:  $Gait Training: 8-22 mins $Therapeutic Exercise: 8-22 mins                    G Codes:      Weston Anna, MPT Pager: [phone]

## 2014-03-26 NOTE — Discharge Summary (Signed)
Physician Discharge Summary  Patient ID: Julie Castillo MRN: [id number] DOB/AGE: Nov 09, 1955 59 y.o.  Admit date: 03/20/2014 Discharge date: 03/22/2014   Procedures:  Procedure(s) (LRB): RIGHT TOTAL KNEE ARTHROPLASTY (Right)  Attending Physician:  Dr. Paralee Cancel   Admission Diagnoses:   Right knee primary OA /pain  Discharge Diagnoses:  Principal Problem:   S/P right TKA Active Problems:   S/P knee replacement  Past Medical History  Diagnosis Date  . Family history of adverse reaction to anesthesia     father- post op became violent   . Hypertension   . Varicose veins   . Edema     lower extremities after standing a long time   . Pneumonia     hx of   . Hypothyroidism   . GERD (gastroesophageal reflux disease)   . Arthritis     HPI:    Julie Castillo, 59 y.o. female, has a history of pain and functional disability in the right knee due to arthritis and has failed non-surgical conservative treatments for greater than 12 weeks to includeNSAID's and/or analgesics, corticosteriod injections, viscosupplementation injections, use of assistive devices and activity modification. Onset of symptoms was gradual, starting 3-4 years ago with gradually worsening course since that time. The patient noted no past surgery on the right knee(s). Patient currently rates pain in the right knee(s) at 9 out of 10 with activity. Patient has night pain, worsening of pain with activity and weight bearing, pain that interferes with activities of daily living, pain with passive range of motion, crepitus and joint swelling. Patient has evidence of periarticular osteophytes and joint space narrowing by imaging studies. There is no active infection. Risks, benefits and expectations were discussed with the patient. Risks including but not limited to the risk of anesthesia, blood clots, nerve damage, blood vessel damage, failure of the prosthesis, infection and up to and including death. Patient understand  the risks, benefits and expectations and wishes to proceed with surgery.   PCP: Adin Hector   Discharged Condition: good  Hospital Course:  Patient underwent the above stated procedure on 03/20/2014. Patient tolerated the procedure well and brought to the recovery room in good condition and subsequently to the floor.  POD #1 BP: 128/68 ; Pulse: 75 ; Temp: 98.3 F (36.8 C) ; Resp: 18 Patient reports pain as moderate, pain not fully controlled. No events throughout the night. Dorsiflexion/plantar flexion intact, incision: dressing C/D/I, no cellulitis present and compartment soft.   LABS  Basename    HGB  10.6  HCT  32.7   POD #2  BP: 143/83 ; Pulse: 94 ; Temp: 98.3 F (36.8 C) ; Resp: 16 Patient reports pain as mild, pain more controlled today than yesterday. No events throughout the night. Ready to be discharged home. Dorsiflexion/plantar flexion intact, incision: dressing C/D/I, no cellulitis present and compartment soft.   LABS  Basename    HGB  11.0  HCT  33.5    Discharge Exam: General appearance: alert, cooperative and no distress Extremities: Homans sign is negative, no sign of DVT, no edema, redness or tenderness in the calves or thighs and no ulcers, gangrene or trophic changes  Disposition: Home with follow up in 2 weeks   Follow-up Information    Follow up with Mauri Pole, MD. Schedule an appointment as soon as possible for a visit in 2 weeks.   Specialty:  Orthopedic Surgery   Contact information:   [address] [place] [address]  [phone]       Follow up with [place].   Why:  home health physical therapy   Contact information:   [phone]      Discharge Instructions    Call MD / Call 911    Complete by:  As directed   If you experience chest pain or shortness of breath, CALL 911 and be transported to the hospital emergency room.  If you develope a fever above 101 F, pus (white drainage) or increased  drainage or redness at the wound, or calf pain, call your surgeon's office.     Change dressing    Complete by:  As directed   Maintain surgical dressing until follow up in the clinic. If the edges start to pull up, may reinforce with tape. If the dressing is no longer working, may remove and cover with gauze and tape, but must keep the area dry and clean.  Call with any questions or concerns.     Constipation Prevention    Complete by:  As directed   Drink plenty of fluids.  Prune juice may be helpful.  You may use a stool softener, such as Colace (over the counter) 100 mg twice a day.  Use MiraLax (over the counter) for constipation as needed.     Diet - low sodium heart healthy    Complete by:  As directed      Discharge instructions    Complete by:  As directed   Maintain surgical dressing until follow up in the clinic. If the edges start to pull up, may reinforce with tape. If the dressing is no longer working, may remove and cover with gauze and tape, but must keep the area dry and clean.  Follow up in 2 weeks at [facility] Call with any questions or concerns.     Driving restrictions    Complete by:  As directed   No driving for 4 weeks     Increase activity slowly as tolerated    Complete by:  As directed      TED hose    Complete by:  As directed   Use stockings (TED hose) for 2 weeks on both leg(s).  You may remove them at night for sleeping.     Weight bearing as tolerated    Complete by:  As directed   Laterality:  right  Extremity:  Lower             Medication List    STOP taking these medications        acetaminophen 650 MG CR tablet  Commonly known as:  TYLENOL  Replaced by:  acetaminophen 325 MG tablet      TAKE these medications        acetaminophen 325 MG tablet  Commonly known as:  TYLENOL  Take 1-2 tablets (325-650 mg total) by mouth every 6 (six) hours as needed for mild pain or moderate pain.     aspirin 325 MG EC tablet  Take 1  tablet (325 mg total) by mouth 2 (two) times daily.     cetirizine 10 MG tablet  Commonly known as:  ZYRTEC  Take 10 mg by mouth daily.     cholecalciferol 1000 UNITS tablet  Commonly known as:  VITAMIN D  Take 2,000 Units by mouth daily.     docusate sodium 100 MG capsule  Commonly known as:  COLACE  Take 1 capsule (100 mg total) by mouth 2 (two) times daily.  enalapril 20 MG tablet  Commonly known as:  VASOTEC  Take 20 mg by mouth at bedtime.     ferrous sulfate 325 (65 FE) MG tablet  Take 1 tablet (325 mg total) by mouth 3 (three) times daily after meals.     hydrochlorothiazide 25 MG tablet  Commonly known as:  HYDRODIURIL  Take 25 mg by mouth daily.     levothyroxine 25 MCG tablet  Commonly known as:  SYNTHROID, LEVOTHROID  Take 25 mcg by mouth daily before breakfast.     methocarbamol 500 MG tablet  Commonly known as:  ROBAXIN  Take 1 tablet (500 mg total) by mouth every 6 (six) hours as needed for muscle spasms.     omeprazole 20 MG capsule  Commonly known as:  PRILOSEC  Take 20 mg by mouth 2 (two) times daily.     oxyCODONE 5 MG immediate release tablet  Commonly known as:  Oxy IR/ROXICODONE  Take 1-3 tablets (5-15 mg total) by mouth every 4 (four) hours as needed for severe pain.     polyethylene glycol packet  Commonly known as:  MIRALAX / GLYCOLAX  Take 17 g by mouth 2 (two) times daily.     VITAMIN B 12 PO  Take 1 tablet by mouth daily.     VITAMIN B6 PO  Take 1 tablet by mouth daily.         Signed: [place]. Khrystian Schauf   PA-C  03/26/2014, 1:18 PM

## 2015-01-11 ENCOUNTER — Other Ambulatory Visit: Payer: Self-pay | Admitting: Internal Medicine

## 2015-01-11 DIAGNOSIS — Z1231 Encounter for screening mammogram for malignant neoplasm of breast: Secondary | ICD-10-CM

## 2015-02-15 ENCOUNTER — Ambulatory Visit
Admission: RE | Admit: 2015-02-15 | Discharge: 2015-02-15 | Disposition: A | Discharge status: Home / Self Care | Payer: 59 | Source: Ambulatory Visit | Attending: Internal Medicine | Admitting: Internal Medicine

## 2015-02-15 ENCOUNTER — Other Ambulatory Visit: Payer: Self-pay | Admitting: Internal Medicine

## 2015-02-15 DIAGNOSIS — Z1231 Encounter for screening mammogram for malignant neoplasm of breast: Secondary | ICD-10-CM | POA: Insufficient documentation

## 2015-03-15 DIAGNOSIS — E559 Vitamin D deficiency, unspecified: Secondary | ICD-10-CM | POA: Diagnosis not present

## 2015-03-15 DIAGNOSIS — I10 Essential (primary) hypertension: Secondary | ICD-10-CM | POA: Diagnosis not present

## 2015-03-22 DIAGNOSIS — E034 Atrophy of thyroid (acquired): Secondary | ICD-10-CM | POA: Insufficient documentation

## 2015-03-22 DIAGNOSIS — M150 Primary generalized (osteo)arthritis: Secondary | ICD-10-CM | POA: Diagnosis not present

## 2015-03-22 DIAGNOSIS — R600 Localized edema: Secondary | ICD-10-CM | POA: Diagnosis not present

## 2015-03-22 DIAGNOSIS — I10 Essential (primary) hypertension: Secondary | ICD-10-CM | POA: Insufficient documentation

## 2015-03-22 DIAGNOSIS — K219 Gastro-esophageal reflux disease without esophagitis: Secondary | ICD-10-CM | POA: Insufficient documentation

## 2015-04-19 DIAGNOSIS — M25562 Pain in left knee: Secondary | ICD-10-CM | POA: Diagnosis not present

## 2015-04-19 DIAGNOSIS — Z96651 Presence of right artificial knee joint: Secondary | ICD-10-CM | POA: Diagnosis not present

## 2015-04-19 DIAGNOSIS — Z471 Aftercare following joint replacement surgery: Secondary | ICD-10-CM | POA: Diagnosis not present

## 2015-05-02 DIAGNOSIS — Z124 Encounter for screening for malignant neoplasm of cervix: Secondary | ICD-10-CM | POA: Diagnosis not present

## 2015-05-02 DIAGNOSIS — N952 Postmenopausal atrophic vaginitis: Secondary | ICD-10-CM | POA: Diagnosis not present

## 2015-05-02 DIAGNOSIS — Z01419 Encounter for gynecological examination (general) (routine) without abnormal findings: Secondary | ICD-10-CM | POA: Diagnosis not present

## 2015-05-02 LAB — HM PAP SMEAR: HM Pap smear: NORMAL

## 2015-07-18 DIAGNOSIS — H524 Presbyopia: Secondary | ICD-10-CM | POA: Diagnosis not present

## 2015-09-26 DIAGNOSIS — I10 Essential (primary) hypertension: Secondary | ICD-10-CM | POA: Diagnosis not present

## 2015-09-26 DIAGNOSIS — E559 Vitamin D deficiency, unspecified: Secondary | ICD-10-CM | POA: Diagnosis not present

## 2015-10-03 DIAGNOSIS — E034 Atrophy of thyroid (acquired): Secondary | ICD-10-CM | POA: Diagnosis not present

## 2015-10-03 DIAGNOSIS — K219 Gastro-esophageal reflux disease without esophagitis: Secondary | ICD-10-CM | POA: Diagnosis not present

## 2015-10-03 DIAGNOSIS — I10 Essential (primary) hypertension: Secondary | ICD-10-CM | POA: Diagnosis not present

## 2015-10-03 DIAGNOSIS — R600 Localized edema: Secondary | ICD-10-CM | POA: Diagnosis not present

## 2016-01-21 ENCOUNTER — Other Ambulatory Visit: Payer: Self-pay | Admitting: Internal Medicine

## 2016-01-21 DIAGNOSIS — Z1231 Encounter for screening mammogram for malignant neoplasm of breast: Secondary | ICD-10-CM

## 2016-02-03 ENCOUNTER — Ambulatory Visit
Admission: RE | Admit: 2016-02-03 | Discharge: 2016-02-03 | Disposition: A | Discharge status: Home / Self Care | Payer: 59 | Source: Ambulatory Visit | Attending: Internal Medicine | Admitting: Internal Medicine

## 2016-02-03 DIAGNOSIS — Z1231 Encounter for screening mammogram for malignant neoplasm of breast: Secondary | ICD-10-CM | POA: Diagnosis not present

## 2016-03-11 ENCOUNTER — Ambulatory Visit: Payer: Self-pay | Admitting: Internal Medicine

## 2016-03-27 ENCOUNTER — Encounter: Payer: Self-pay | Admitting: Internal Medicine

## 2016-03-27 ENCOUNTER — Ambulatory Visit (INDEPENDENT_AMBULATORY_CARE_PROVIDER_SITE_OTHER): Payer: 59 | Admitting: Internal Medicine

## 2016-03-27 VITALS — BP 132/86 | HR 77 | Temp 97.7°F | Ht 63.0 in | Wt 166.0 lb

## 2016-03-27 DIAGNOSIS — H02402 Unspecified ptosis of left eyelid: Secondary | ICD-10-CM | POA: Diagnosis not present

## 2016-03-27 DIAGNOSIS — E034 Atrophy of thyroid (acquired): Secondary | ICD-10-CM

## 2016-03-27 DIAGNOSIS — E559 Vitamin D deficiency, unspecified: Secondary | ICD-10-CM | POA: Insufficient documentation

## 2016-03-27 DIAGNOSIS — M1990 Unspecified osteoarthritis, unspecified site: Secondary | ICD-10-CM | POA: Insufficient documentation

## 2016-03-27 DIAGNOSIS — R600 Localized edema: Secondary | ICD-10-CM

## 2016-03-27 DIAGNOSIS — J302 Other seasonal allergic rhinitis: Secondary | ICD-10-CM | POA: Insufficient documentation

## 2016-03-27 DIAGNOSIS — K219 Gastro-esophageal reflux disease without esophagitis: Secondary | ICD-10-CM | POA: Diagnosis not present

## 2016-03-27 DIAGNOSIS — S20222A Contusion of left back wall of thorax, initial encounter: Secondary | ICD-10-CM | POA: Diagnosis not present

## 2016-03-27 DIAGNOSIS — I10 Essential (primary) hypertension: Secondary | ICD-10-CM | POA: Diagnosis not present

## 2016-03-27 NOTE — Progress Notes (Signed)
Date:  03/27/2016   Name:  Julie Castillo   DOB:  Jul 28, 1955   MRN:  [id number]   Chief Complaint: [place]; Hypertension; Hypothyroidism; and Gastroesophageal Reflux Hypertension  This is a chronic problem. The problem is unchanged. The problem is controlled. Past treatments include ACE inhibitors and diuretics.  Gastroesophageal Reflux  She reports no heartburn, no sore throat or no wheezing. This is a recurrent problem. The problem occurs occasionally. She has tried a PPI for the symptoms.  Weight loss - has been on phentermine for the past 5 months.  With diet change as well she has lost 45 lbs and feels well.  She has begun to taper off the medication and hopes to maintain. Knee pain - s/p knee surgery.  Doing fairly well but can not do strenuous exercise.  She may need to have the other knee done but is managing with steroid injection as needed for now.    Review of Systems  HENT: Negative for sore throat.   Respiratory: Negative for wheezing.   Gastrointestinal: Negative for heartburn.    Patient Active Problem List   Diagnosis Date Noted  . Lower extremity edema 03/27/2016  . Osteoarthritis 03/27/2016  . Vitamin D deficiency 03/27/2016  . Seasonal allergies 03/27/2016  . Essential hypertension 03/22/2015  . Gastroesophageal reflux disease without esophagitis 03/22/2015  . Hypothyroidism due to acquired atrophy of thyroid 03/22/2015  . S/P right TKA 03/20/2014  . S/P knee replacement 03/20/2014    Prior to Admission medications   Medication Sig Start Date End Date Taking? Authorizing Provider  acetaminophen (TYLENOL) 325 MG tablet Take 1-2 tablets (325-650 mg total) by mouth every 6 (six) hours as needed for mild pain or moderate pain. 03/22/14  Yes Danae Orleans, PA-C  cholecalciferol (VITAMIN D) 1000 UNITS tablet Take 2,000 Units by mouth daily.   Yes Historical Provider, MD  Cyanocobalamin (VITAMIN B 12 PO) Take 1 tablet by mouth daily.   Yes Historical Provider,  MD  enalapril (VASOTEC) 20 MG tablet Take 20 mg by mouth at bedtime.   Yes Historical Provider, MD  fluticasone Asencion Islam) 50 MCG/ACT nasal spray  03/02/16  Yes Historical Provider, MD  hydrochlorothiazide (HYDRODIURIL) 25 MG tablet Take 25 mg by mouth daily.   Yes Historical Provider, MD  levothyroxine (SYNTHROID, LEVOTHROID) 25 MCG tablet Take 25 mcg by mouth daily before breakfast.   Yes Historical Provider, MD  omeprazole (PRILOSEC) 20 MG capsule Take 20 mg by mouth 2 (two) times daily.   Yes Historical Provider, MD  phentermine (ADIPEX-P) 37.5 MG tablet Take 37.5 mg by mouth every morning. 02/25/16  Yes Historical Provider, MD  Pyridoxine HCl (VITAMIN B6 PO) Take 1 tablet by mouth daily.   Yes Historical Provider, MD  amoxicillin (AMOXIL) 500 MG capsule  01/06/16   Historical Provider, MD    Allergies  Allergen Reactions  . Adhesive [Tape]     Sensitive skin- can use paper tape  . Sulfa Antibiotics Hives    Past Surgical History:  Procedure Laterality Date  . BREAST CYST ASPIRATION Left 1988  . EYE SURGERY     left eye surgery x 2 ( eyelid)   . TOTAL KNEE ARTHROPLASTY Right 03/20/2014   Procedure: RIGHT TOTAL KNEE ARTHROPLASTY;  Surgeon: Mauri Pole, MD;  Location: [facility];  Service: Orthopedics;  Laterality: Right;    Social History  Substance Use Topics  . Smoking status: Never Smoker  . Smokeless tobacco: Never Used  . Alcohol use Yes  Comment: occasional      Medication list has been reviewed and updated.   Physical Exam  Constitutional: She is oriented to person, place, and time. She appears well-developed. No distress.  HENT:  Head: Normocephalic and atraumatic.  Eyes:  Left upper eye lid partial droop  Neck: Normal range of motion. Carotid bruit is not present. No thyromegaly present.  Cardiovascular: Normal rate, regular rhythm and normal heart sounds.   Pulmonary/Chest: Effort normal and breath sounds normal. No respiratory distress.  Abdominal: Soft.  Bowel sounds are normal. She exhibits no distension. There is no tenderness.  Musculoskeletal: She exhibits no edema.       Lumbar back: She exhibits tenderness.       Back:  Neurological: She is alert and oriented to person, place, and time.  Skin: Skin is warm and dry. No rash noted.  Psychiatric: She has a normal mood and affect. Her behavior is normal. Thought content normal.  Nursing note and vitals reviewed.   BP 132/86   Pulse 77   Temp 97.7 F (36.5 C)   Ht 5\' 3"  (1.6 m)   Wt 166 lb (75.3 kg)   SpO2 98%   BMI 29.41 kg/m   Assessment and Plan: 1. Essential hypertension controlled - Comprehensive metabolic panel  2. Hypothyroidism due to acquired atrophy of thyroid supplemented - TSH  3. Gastroesophageal reflux disease without esophagitis stable  4. Lower extremity edema Controlled with diuretic - Comprehensive metabolic panel  5. Contusion of left side of back, initial encounter Supportive care, heat, anti-inflammatory medication  6. Ptosis of eyelid, left   Halina Maidens, MD [facility]  03/27/2016

## 2016-03-28 LAB — COMPREHENSIVE METABOLIC PANEL
A/G RATIO: 2.2 (ref 1.2–2.2)
ALBUMIN: 4.8 g/dL (ref 3.6–4.8)
ALT: 16 IU/L (ref 0–32)
AST: 17 IU/L (ref 0–40)
Alkaline Phosphatase: 68 IU/L (ref 39–117)
BILIRUBIN TOTAL: 0.4 mg/dL (ref 0.0–1.2)
BUN / CREAT RATIO: 22 (ref 12–28)
BUN: 16 mg/dL (ref 8–27)
CHLORIDE: 87 mmol/L — AB (ref 96–106)
CO2: 31 mmol/L — ABNORMAL HIGH (ref 18–29)
Calcium: 9.6 mg/dL (ref 8.7–10.3)
Creatinine, Ser: 0.72 mg/dL (ref 0.57–1.00)
GFR calc non Af Amer: 90 mL/min/{1.73_m2} (ref >59)
GFR, EST AFRICAN AMERICAN: 104 mL/min/{1.73_m2} (ref >59)
GLOBULIN, TOTAL: 2.2 g/dL (ref 1.5–4.5)
Glucose: 85 mg/dL (ref 65–99)
POTASSIUM: 3.9 mmol/L (ref 3.5–5.2)
SODIUM: 133 mmol/L — AB (ref 134–144)
TOTAL PROTEIN: 7 g/dL (ref 6.0–8.5)

## 2016-03-28 LAB — TSH: TSH: 1.39 u[IU]/mL (ref 0.450–4.500)

## 2016-04-03 DIAGNOSIS — M1712 Unilateral primary osteoarthritis, left knee: Secondary | ICD-10-CM | POA: Diagnosis not present

## 2016-06-03 ENCOUNTER — Other Ambulatory Visit: Payer: Self-pay

## 2016-06-03 MED ORDER — LEVOTHYROXINE SODIUM 25 MCG PO TABS: 25.0000 ug | ORAL_TABLET | Freq: Every day | ORAL | 3 refills | Status: DC

## 2016-07-30 DIAGNOSIS — H524 Presbyopia: Secondary | ICD-10-CM | POA: Diagnosis not present

## 2016-09-15 IMAGING — MG MM DIGITAL SCREENING BILAT W/ CAD
6 series · 6 of 6 positions shown · non-contrast
Comparison: Previous exam(s).

CLINICAL DATA: Screening.

EXAM:
DIGITAL SCREENING BILATERAL MAMMOGRAM WITH CAD

[L MLO]
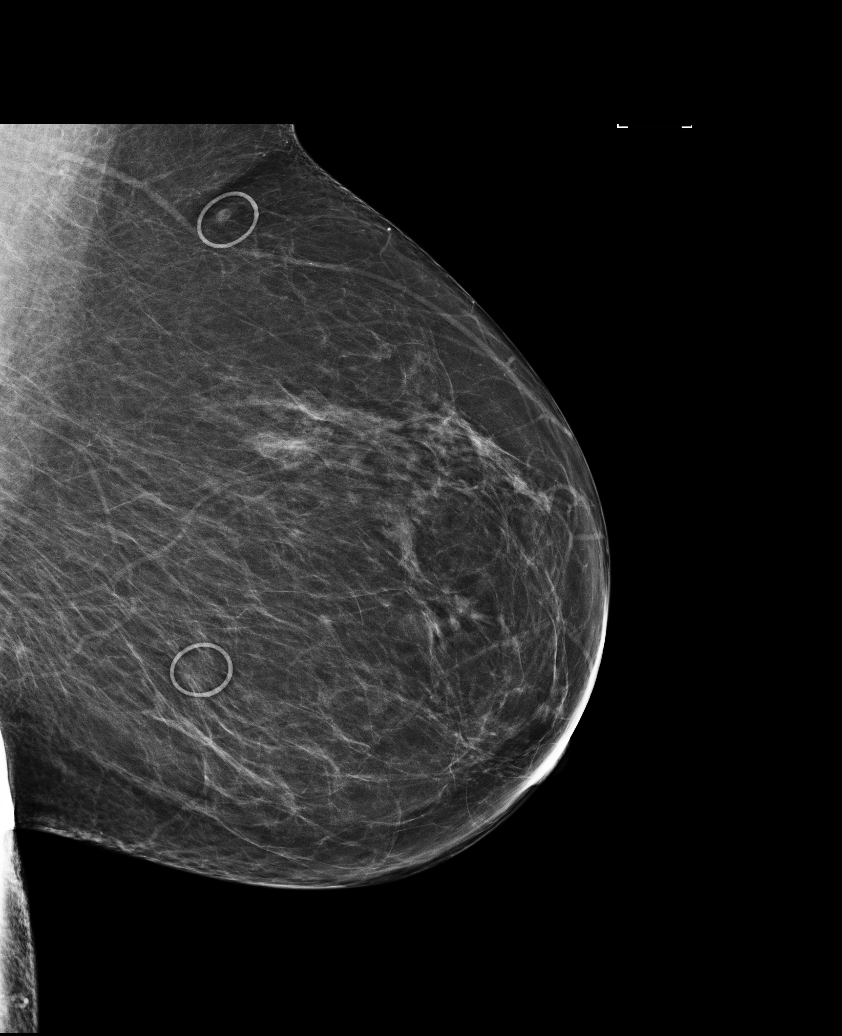

[R CC (1 of 2)]
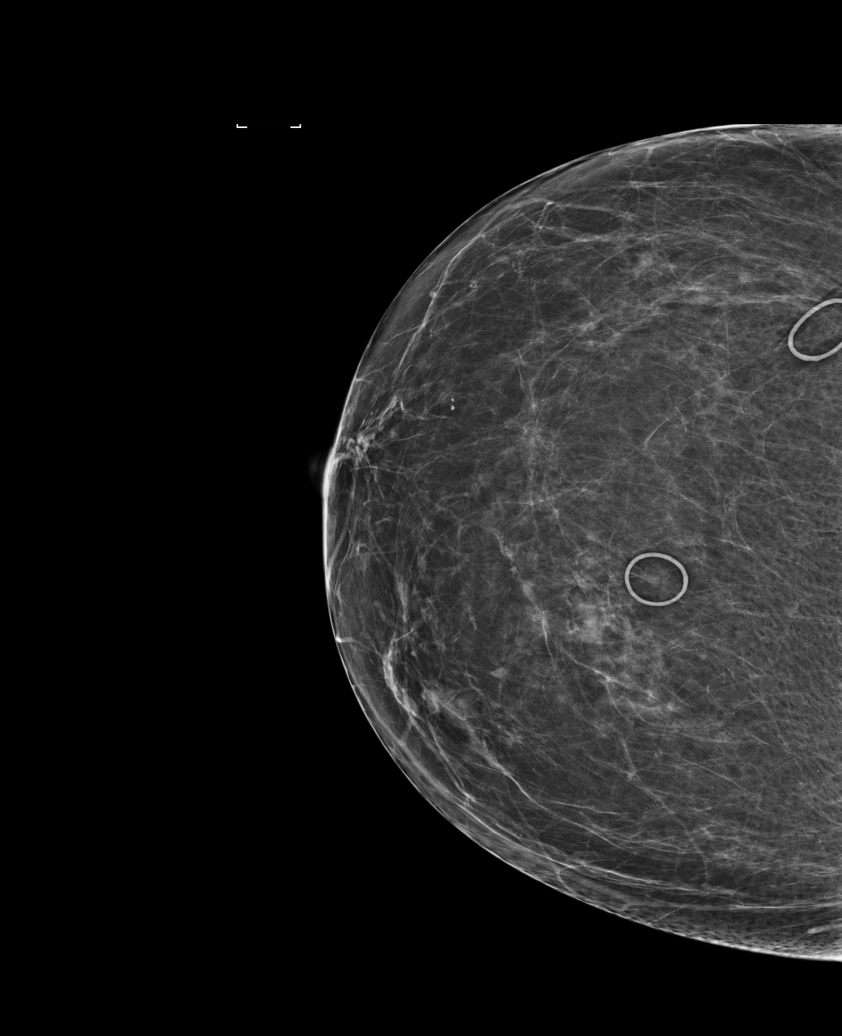

[L CC]
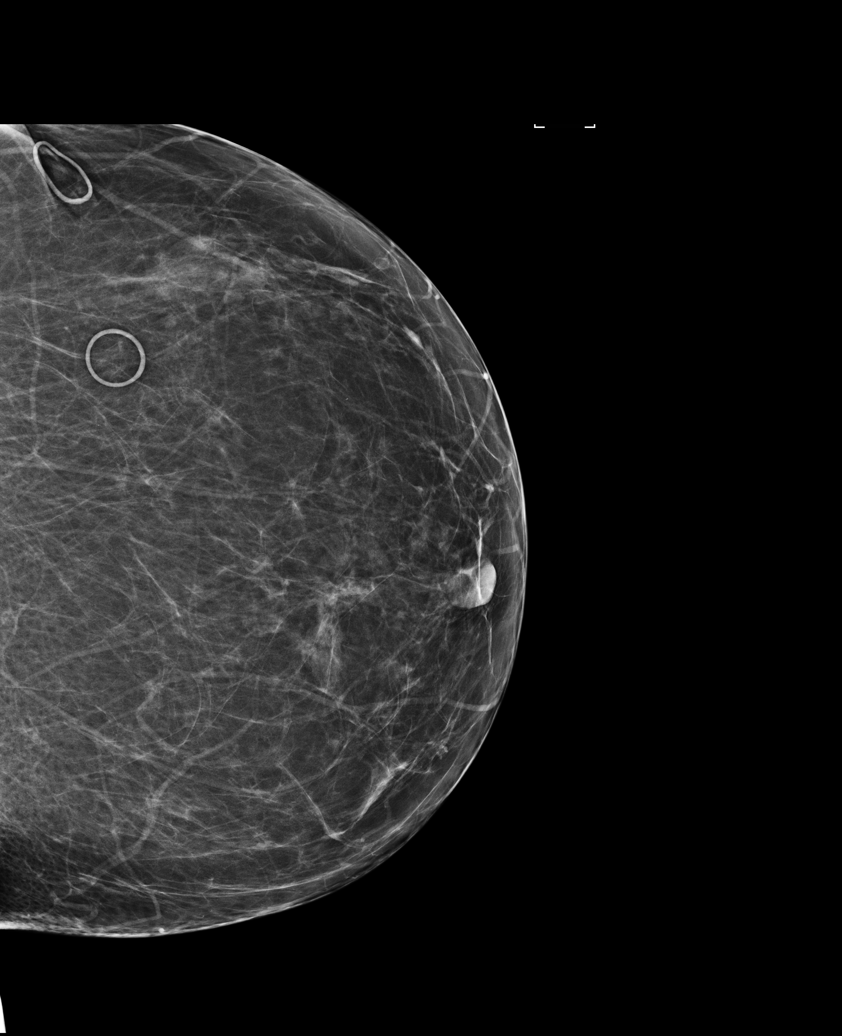

[R MLO (1 of 2)]
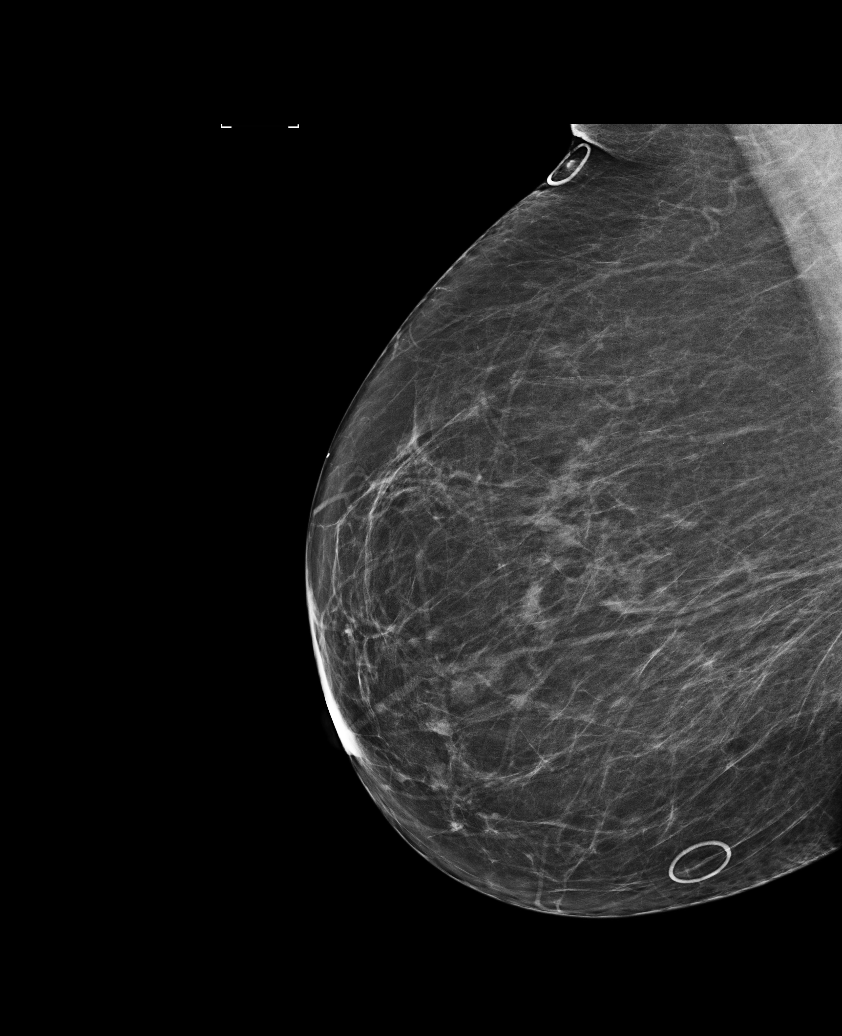

[R CC (2 of 2)]
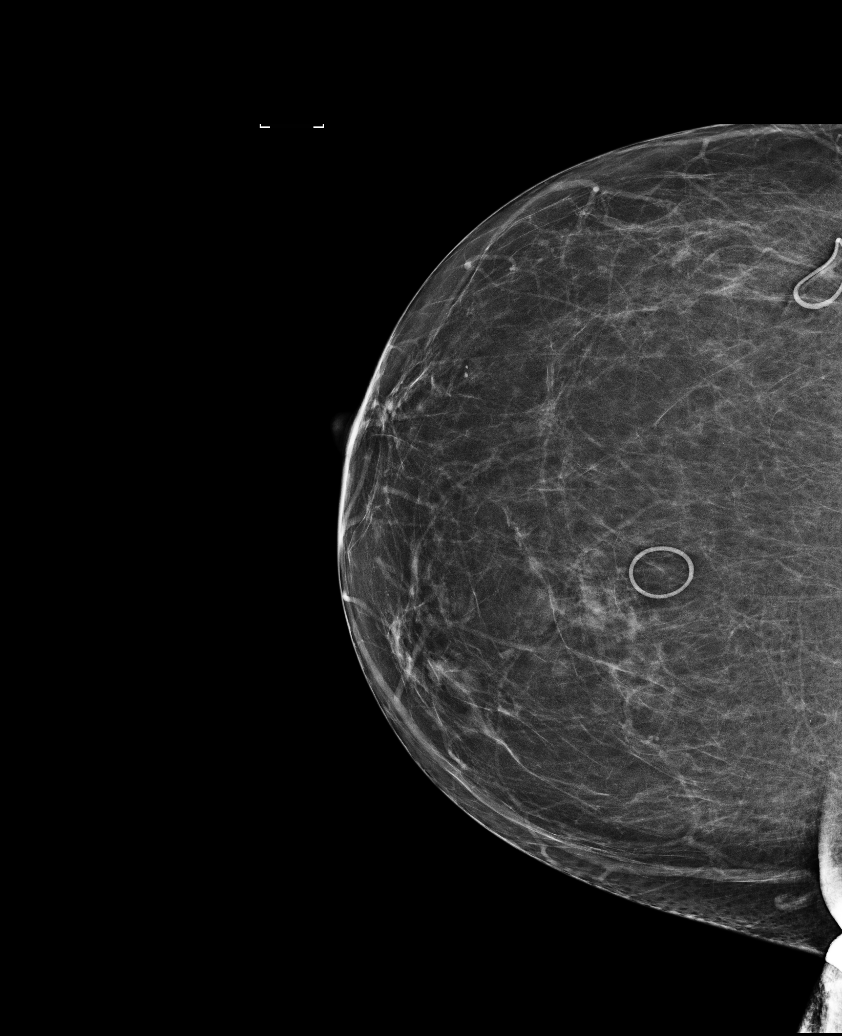

[R MLO (2 of 2)]
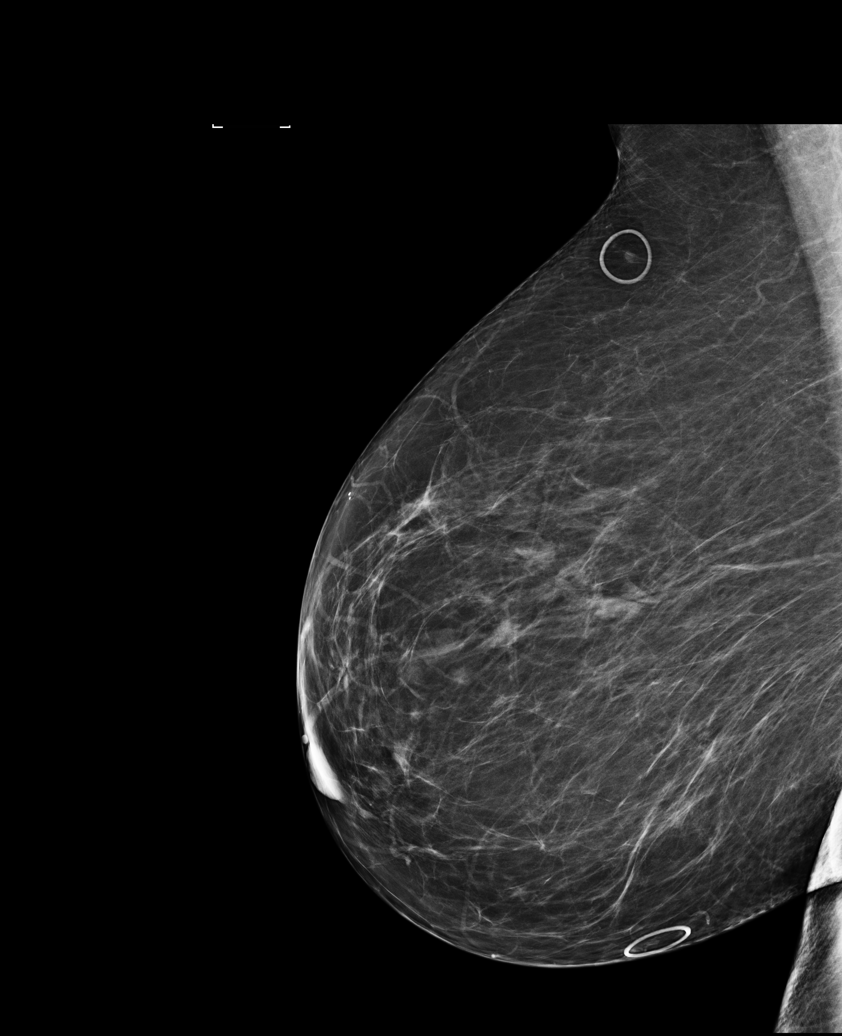

[6 of 6 positions shown; findings below may reference images not displayed]

ACR Breast Density Category b: There are scattered areas of
fibroglandular density.
FINDINGS: There are no findings suspicious for malignancy. Images were
processed with CAD.
IMPRESSION: No mammographic evidence of malignancy. A result letter of this
screening mammogram will be mailed directly to the patient.

RECOMMENDATION:
Screening mammogram in one year. (Code:AS-G-LCT)

BI-RADS CATEGORY  1: Negative.

## 2016-09-18 DIAGNOSIS — M25562 Pain in left knee: Secondary | ICD-10-CM | POA: Diagnosis not present

## 2016-09-18 DIAGNOSIS — G8929 Other chronic pain: Secondary | ICD-10-CM | POA: Diagnosis not present

## 2016-09-18 DIAGNOSIS — M1712 Unilateral primary osteoarthritis, left knee: Secondary | ICD-10-CM | POA: Diagnosis not present

## 2016-10-02 ENCOUNTER — Encounter: Payer: Self-pay | Admitting: Internal Medicine

## 2016-10-02 ENCOUNTER — Ambulatory Visit (INDEPENDENT_AMBULATORY_CARE_PROVIDER_SITE_OTHER): Payer: 59 | Admitting: Internal Medicine

## 2016-10-02 VITALS — BP 150/98 | HR 68 | Temp 97.8°F | Ht 63.0 in | Wt 141.4 lb

## 2016-10-02 DIAGNOSIS — M1712 Unilateral primary osteoarthritis, left knee: Secondary | ICD-10-CM

## 2016-10-02 DIAGNOSIS — E871 Hypo-osmolality and hyponatremia: Secondary | ICD-10-CM | POA: Diagnosis not present

## 2016-10-02 DIAGNOSIS — I10 Essential (primary) hypertension: Secondary | ICD-10-CM | POA: Diagnosis not present

## 2016-10-02 DIAGNOSIS — E034 Atrophy of thyroid (acquired): Secondary | ICD-10-CM

## 2016-10-02 DIAGNOSIS — E782 Mixed hyperlipidemia: Secondary | ICD-10-CM | POA: Diagnosis not present

## 2016-10-02 MED ORDER — ENALAPRIL MALEATE 20 MG PO TABS: 20.0000 mg | ORAL_TABLET | Freq: Two times a day (BID) | ORAL | 1 refills | Status: DC

## 2016-10-02 NOTE — Progress Notes (Signed)
Date:  10/02/2016   Name:  Julie Castillo   DOB:  [birth year]   MRN:  [id number]   Chief Complaint: Hypertension and Hypothyroidism Hypertension  This is a chronic problem. Condition status: fluctuating with higher readings in medical office. Pertinent negatives include no chest pain, headaches, palpitations, peripheral edema or shortness of breath. Past treatments include ACE inhibitors. The current treatment provides moderate improvement. There are no compliance problems.  Identifiable causes of hypertension include a thyroid problem.  Thyroid Problem  Presents for follow-up visit. Patient reports no fatigue, leg swelling, palpitations, tremors, weight gain or weight loss. The symptoms have been stable.  OA Knee - needs surgical clearance for LTK planned for 10/19/16.   Hyponatremia - mildly decreased sodium noted on routine labs.  Needs to be repeated today. Weight loss - has stablized after a 50 lb loss on medication.  No longer taking medication and doing well.    Review of Systems  Constitutional: Negative for appetite change, fatigue, fever, unexpected weight change, weight gain and weight loss.  HENT: Negative for tinnitus and trouble swallowing.   Eyes: Negative for visual disturbance.  Respiratory: Negative for cough, chest tightness and shortness of breath.   Cardiovascular: Negative for chest pain, palpitations and leg swelling.  Gastrointestinal: Negative for abdominal pain.  Endocrine: Negative for polydipsia and polyuria.  Genitourinary: Negative for dysuria and hematuria.  Musculoskeletal: Positive for arthralgias and gait problem.  Neurological: Negative for tremors, numbness and headaches.  Psychiatric/Behavioral: Negative for dysphoric mood.    Patient Active Problem List   Diagnosis Date Noted  . Lower extremity edema 03/27/2016  . Osteoarthritis 03/27/2016  . Vitamin D deficiency 03/27/2016  . Seasonal allergies 03/27/2016  . Ptosis of eyelid, left 03/27/2016    . Essential hypertension 03/22/2015  . Gastroesophageal reflux disease without esophagitis 03/22/2015  . Hypothyroidism due to acquired atrophy of thyroid 03/22/2015  . S/P right TKA 03/20/2014  . S/P knee replacement 03/20/2014    Prior to Admission medications   Medication Sig Start Date End Date Taking? Authorizing Provider  acetaminophen (TYLENOL) 325 MG tablet Take 1-2 tablets (325-650 mg total) by mouth every 6 (six) hours as needed for mild pain or moderate pain. 03/22/14  Yes Babish, Rodman Key, PA-C  cholecalciferol (VITAMIN D) 1000 UNITS tablet Take 2,000 Units by mouth daily.   Yes [provider]  Cyanocobalamin (VITAMIN B 12 PO) Take 1 tablet by mouth daily.   Yes [provider]  enalapril (VASOTEC) 20 MG tablet Take 20 mg by mouth at bedtime.   Yes [provider]  fluticasone Asencion Islam) 50 MCG/ACT nasal spray  03/02/16  Yes [provider]  hydrochlorothiazide (HYDRODIURIL) 25 MG tablet Take 25 mg by mouth daily.   Yes [provider]  levothyroxine (SYNTHROID, LEVOTHROID) 25 MCG tablet Take 1 tablet (25 mcg total) by mouth daily before breakfast. 06/03/16  Yes Glean Hess, MD  omeprazole (PRILOSEC) 20 MG capsule Take 20 mg by mouth 2 (two) times daily.   Yes [provider]  Pyridoxine HCl (VITAMIN B6 PO) Take 1 tablet by mouth daily.   Yes [provider]    Allergies  Allergen Reactions  . Adhesive [Tape]     Sensitive skin- can use paper tape  . Sulfa Antibiotics Hives    Past Surgical History:  Procedure Laterality Date  . BREAST CYST ASPIRATION Left 1988  . EYE SURGERY  2001,2005   left eye surgery x 2 ( eyelid)   .  TOTAL KNEE ARTHROPLASTY Right 03/20/2014   Procedure: RIGHT TOTAL KNEE ARTHROPLASTY;  Surgeon: Mauri Pole, MD;  Location: [facility];  Service: Orthopedics;  Laterality: Right;    Social History  Substance Use Topics  . Smoking status: Never Smoker  . Smokeless tobacco: Never Used   . Alcohol use Yes     Comment: occasional      Medication list has been reviewed and updated.   Physical Exam  Constitutional: She is oriented to person, place, and time. She appears well-developed. No distress.  HENT:  Head: Normocephalic and atraumatic.  Neck: Normal range of motion. Neck supple. No thyromegaly present.  Cardiovascular: Normal rate, regular rhythm and normal heart sounds.   Pulmonary/Chest: Effort normal and breath sounds normal. No respiratory distress. She has no wheezes.  Musculoskeletal: She exhibits no edema.       Left knee: She exhibits no swelling and no effusion. Tenderness found. Lateral joint line tenderness noted.  Neurological: She is alert and oriented to person, place, and time.  Skin: Skin is warm and dry. No rash noted.  Psychiatric: She has a normal mood and affect. Her behavior is normal. Thought content normal.  Nursing note and vitals reviewed.   BP (!) 144/86 (BP Location: Left Arm, Patient Position: Sitting, Cuff Size: Normal)   Pulse 68   Temp 97.8 F (36.6 C) (Oral)   Ht 5\' 3"  (1.6 m)   Wt 141 lb 6.4 oz (64.1 kg)   SpO2 98%   BMI 25.05 kg/m   Assessment and Plan: 1. Essential hypertension Not controlled - increase enalapril to bid To be rechecked at pre-op visit - Comprehensive metabolic panel - enalapril (VASOTEC) 20 MG tablet; Take 1 tablet (20 mg total) by mouth 2 (two) times daily.  Dispense: 180 tablet; Refill: 1  2. Hypothyroidism due to acquired atrophy of thyroid supplemented - TSH  3. Hyponatremia Recheck labs - Comprehensive metabolic panel  4. Mixed hyperlipidemia Continue diet and weight maintenance - Lipid panel  5. Primary osteoarthritis of left knee Scheduled for surgery in 2 weeks Form for clearance signed and given back to patient   Meds ordered this encounter  Medications  . enalapril (VASOTEC) 20 MG tablet    Sig: Take 1 tablet (20 mg total) by mouth 2 (two) times daily.    Dispense:  180  tablet    Refill:  Selz, MD [facility]  10/02/2016

## 2016-10-03 LAB — LIPID PANEL
CHOLESTEROL TOTAL: 160 mg/dL (ref 100–199)
Chol/HDL Ratio: 1.7 ratio (ref 0.0–4.4)
HDL: 92 mg/dL (ref >39)
LDL Calculated: 59 mg/dL (ref 0–99)
TRIGLYCERIDES: 46 mg/dL (ref 0–149)
VLDL Cholesterol Cal: 9 mg/dL (ref 5–40)

## 2016-10-03 LAB — COMPREHENSIVE METABOLIC PANEL
A/G RATIO: 2 (ref 1.2–2.2)
ALK PHOS: 60 IU/L (ref 39–117)
ALT: 13 IU/L (ref 0–32)
AST: 16 IU/L (ref 0–40)
Albumin: 4.5 g/dL (ref 3.6–4.8)
BILIRUBIN TOTAL: 0.5 mg/dL (ref 0.0–1.2)
BUN/Creatinine Ratio: 19 (ref 12–28)
BUN: 13 mg/dL (ref 8–27)
CHLORIDE: 96 mmol/L (ref 96–106)
CO2: 28 mmol/L (ref 20–29)
Calcium: 9.6 mg/dL (ref 8.7–10.3)
Creatinine, Ser: 0.67 mg/dL (ref 0.57–1.00)
GFR calc Af Amer: 109 mL/min/{1.73_m2} (ref >59)
GFR calc non Af Amer: 95 mL/min/{1.73_m2} (ref >59)
Globulin, Total: 2.2 g/dL (ref 1.5–4.5)
Glucose: 81 mg/dL (ref 65–99)
POTASSIUM: 4 mmol/L (ref 3.5–5.2)
Sodium: 138 mmol/L (ref 134–144)
Total Protein: 6.7 g/dL (ref 6.0–8.5)

## 2016-10-03 LAB — TSH: TSH: 1.77 u[IU]/mL (ref 0.450–4.500)

## 2016-10-10 NOTE — H&P (Signed)
TOTAL KNEE ADMISSION H&P  Patient is being admitted for left total knee arthroplasty.  Subjective:  Chief Complaint:   Left knee primary OA /  pain  HPI: Julie Castillo, 61 y.o. female, has a history of pain and functional disability in the left knee due to arthritis and has failed non-surgical conservative treatments for greater than 12 weeks to include NSAID's and/or analgesics, corticosteriod injections, viscosupplementation injections, use of assistive devices and activity modification.  Onset of symptoms was gradual, starting 5-7 years ago with gradually worsening course since that time. The patient noted no past surgery on the left knee(s).  Patient currently rates pain in the left knee(s) at 10 out of 10 with activity. Patient has night pain, worsening of pain with activity and weight bearing, pain that interferes with activities of daily living, pain with passive range of motion, crepitus and joint swelling.  Patient has evidence of periarticular osteophytes and joint space narrowing by imaging studies.  There is no active infection.  Risks, benefits and expectations were discussed with the patient.  Risks including but not limited to the risk of anesthesia, drop foot, blood clots, nerve damage, blood vessel damage, failure of the prosthesis, infection and up to and including death.  Patient understand the risks, benefits and expectations and wishes to proceed with surgery.   PCP: Glean Hess, MD  D/C Plans:       Home  Post-op Meds:       No Rx given  Tranexamic Acid:      To be given - IV   Decadron:      Is to be given  FYI:     ASA  Norco  DME:   Pt already has equipment  PT:   Rx given for OPPT    Patient Active Problem List   Diagnosis Date Noted  . Hyponatremia 10/02/2016  . Mixed hyperlipidemia 10/02/2016  . Lower extremity edema 03/27/2016  . Osteoarthritis 03/27/2016  . Vitamin D deficiency 03/27/2016  . Seasonal allergies 03/27/2016  . Ptosis of eyelid, left  03/27/2016  . Essential hypertension 03/22/2015  . Gastroesophageal reflux disease without esophagitis 03/22/2015  . Hypothyroidism due to acquired atrophy of thyroid 03/22/2015  . S/P right TKA 03/20/2014  . S/P knee replacement 03/20/2014   Past Medical History:  Diagnosis Date  . Arthritis   . Edema    lower extremities after standing a long time   . Family history of adverse reaction to anesthesia    father- post op became violent   . GERD (gastroesophageal reflux disease)   . Hypertension   . Hypothyroidism   . Pneumonia    hx of   . Varicose veins     Past Surgical History:  Procedure Laterality Date  . BREAST CYST ASPIRATION Left 1988  . EYE SURGERY  2001,2005   left eye surgery x 2 ( eyelid)   . TOTAL KNEE ARTHROPLASTY Right 03/20/2014   Procedure: RIGHT TOTAL KNEE ARTHROPLASTY;  Surgeon: Mauri Pole, MD;  Location: [facility];  Service: Orthopedics;  Laterality: Right;    No prescriptions prior to admission.   Allergies  Allergen Reactions  . Adhesive [Tape] Other (See Comments)    Sensitive skin- can use paper tape bandaids- make skin red   . Sulfa Antibiotics Hives    Social History  Substance Use Topics  . Smoking status: Never Smoker  . Smokeless tobacco: Never Used  . Alcohol use Yes     Comment: occasional  Family History  Problem Relation Age of Onset  . Breast cancer Mother 70  . Breast cancer Paternal Aunt 23  . Breast cancer Cousin 35       BRCA Negative     Review of Systems  Constitutional: Negative.   HENT: Negative.   Eyes: Negative.   Respiratory: Negative.   Cardiovascular: Negative.   Gastrointestinal: Negative.   Genitourinary: Negative.   Musculoskeletal: Positive for joint pain.  Skin: Negative.   Neurological: Negative.   Endo/Heme/Allergies: Negative.   Psychiatric/Behavioral: Negative.     Objective:  Physical Exam  Constitutional: She is oriented to person, place, and time. She appears well-developed.  HENT:   Head: Normocephalic.  Eyes: Pupils are equal, round, and reactive to light.  Neck: Neck supple. No JVD present. No tracheal deviation present. No thyromegaly present.  Cardiovascular: Normal rate, regular rhythm and intact distal pulses.   Respiratory: Effort normal and breath sounds normal. No respiratory distress. She has no wheezes.  GI: Soft. There is no tenderness. There is no guarding.  Musculoskeletal:       Left knee: She exhibits decreased range of motion, swelling, abnormal alignment (significant valgus) and bony tenderness. She exhibits no ecchymosis, no deformity, no laceration and no erythema. Tenderness found.  Lymphadenopathy:    She has no cervical adenopathy.  Neurological: She is alert and oriented to person, place, and time. A sensory deficit (occassional tingling right foot, previous drop foot) is present.  Skin: Skin is warm and dry.  Psychiatric: She has a normal mood and affect.      Labs:  Estimated body mass index is 25.05 kg/m as calculated from the following:   Height as of 10/02/16: _0  (1.6 m).   Weight as of 10/02/16: 64.1 kg (141 lb 6.4 oz).   Imaging Review Plain radiographs demonstrate severe degenerative joint disease of the left knee(s). The overall alignment is significant valgus. The bone quality appears to be good for age and reported activity level.  Assessment/Plan:  End stage arthritis, left knee   The patient history, physical examination, clinical judgment of the provider and imaging studies are consistent with end stage degenerative joint disease of the left knee(s) and total knee arthroplasty is deemed medically necessary. The treatment options including medical management, injection therapy arthroscopy and arthroplasty were discussed at length. The risks and benefits of total knee arthroplasty were presented and reviewed. The risks due to aseptic loosening, infection, stiffness, patella tracking problems, thromboembolic complications and  other imponderables were discussed. The patient acknowledged the explanation, agreed to proceed with the plan and consent was signed. Patient is being admitted for inpatient treatment for surgery, pain control, PT, OT, prophylactic antibiotics, VTE prophylaxis, progressive ambulation and ADL's and discharge planning. The patient is planning to be discharged home.    West Pugh Stephfon Bovey   PA-C  10/10/2016, 10:59 AM

## 2016-10-12 ENCOUNTER — Other Ambulatory Visit (HOSPITAL_COMMUNITY): Payer: Self-pay | Admitting: Emergency Medicine

## 2016-10-12 NOTE — Progress Notes (Signed)
LOV/ surgical clearance Dr Army Melia 10-02-16 epic  LABS 10-02-16 epic : CMP, TSH, Lipid panel

## 2016-10-12 NOTE — Patient Instructions (Signed)
Julie Castillo  10/12/2016   Your procedure is scheduled on: 10-19-16  Report to [facility] Main  Entrance    Report to admitting at [place]   Call this number if you have problems the morning of surgery [phone]   Remember: ONLY 1 PERSON MAY GO WITH YOU TO SHORT STAY TO GET  READY MORNING OF YOUR SURGERY.  Do not eat food or drink liquids :After Midnight.     Take these medicines the morning of surgery with A SIP OF WATER: omeprazole(prilosec), tylenol as needed, nasal spray as needed                                You may not have any metal on your body including hair pins and              piercings  Do not wear jewelry, make-up, lotions, powders or perfumes, deodorant             Do not wear nail polish.  Do not shave  48 hours prior to surgery.                Do not bring valuables to the hospital. [place].  Contacts, dentures or bridgework may not be worn into surgery.  Leave suitcase in the car. After surgery it may be brought to your room.                Please read over the following fact sheets you were given: _____________________________________________________________________            [facility] - Preparing for Surgery Before surgery, you can play an important role.  Because skin is not sterile, your skin needs to be as free of germs as possible.  You can reduce the number of germs on your skin by washing with CHG (chlorahexidine gluconate) soap before surgery.  CHG is an antiseptic cleaner which kills germs and bonds with the skin to continue killing germs even after washing. Please DO NOT use if you have an allergy to CHG or antibacterial soaps.  If your skin becomes reddened/irritated stop using the CHG and inform your nurse when you arrive at Short Stay. Do not shave (including legs and underarms) for at least 48 hours prior to the first CHG shower.  You may shave your face/neck. Please  follow these instructions carefully:  1.  Shower with CHG Soap the night before surgery and the  morning of Surgery.  2.  If you choose to wash your hair, wash your hair first as usual with your  normal  shampoo.  3.  After you shampoo, rinse your hair and body thoroughly to remove the  shampoo.                           4.  Use CHG as you would any other liquid soap.  You can apply chg directly  to the skin and wash                       Gently with a scrungie or clean washcloth.  5.  Apply the CHG Soap to your body ONLY FROM THE NECK  DOWN.   Do not use on face/ open                           Wound or open sores. Avoid contact with eyes, ears mouth and genitals (private parts).                       Wash face,  Genitals (private parts) with your normal soap.             6.  Wash thoroughly, paying special attention to the area where your surgery  will be performed.  7.  Thoroughly rinse your body with warm water from the neck down.  8.  DO NOT shower/wash with your normal soap after using and rinsing off  the CHG Soap.                9.  Pat yourself dry with a clean towel.            10.  Wear clean pajamas.            11.  Place clean sheets on your bed the night of your first shower and do not  sleep with pets. Day of Surgery : Do not apply any lotions/deodorants the morning of surgery.  Please wear clean clothes to the hospital/surgery center.  FAILURE TO FOLLOW THESE INSTRUCTIONS MAY RESULT IN THE CANCELLATION OF YOUR SURGERY PATIENT SIGNATURE_________________________________  NURSE SIGNATURE__________________________________  ________________________________________________________________________   [name]  An incentive spirometer is a tool that can help keep your lungs clear and active. This tool measures how well you are filling your lungs with each breath. Taking long deep breaths may help reverse or decrease the chance of developing breathing (pulmonary) problems  (especially infection) following:  A long period of time when you are unable to move or be active. BEFORE THE PROCEDURE   If the spirometer includes an indicator to show your best effort, your nurse or respiratory therapist will set it to a desired goal.  If possible, sit up straight or lean slightly forward. Try not to slouch.  Hold the incentive spirometer in an upright position. INSTRUCTIONS FOR USE  1. Sit on the edge of your bed if possible, or sit up as far as you can in bed or on a chair. 2. Hold the incentive spirometer in an upright position. 3. Breathe out normally. 4. Place the mouthpiece in your mouth and seal your lips tightly around it. 5. Breathe in slowly and as deeply as possible, raising the piston or the ball toward the top of the column. 6. Hold your breath for 3-5 seconds or for as long as possible. Allow the piston or ball to fall to the bottom of the column. 7. Remove the mouthpiece from your mouth and breathe out normally. 8. Rest for a few seconds and repeat Steps 1 through 7 at least 10 times every 1-2 hours when you are awake. Take your time and take a few normal breaths between deep breaths. 9. The spirometer may include an indicator to show your best effort. Use the indicator as a goal to work toward during each repetition. 10. After each set of 10 deep breaths, practice coughing to be sure your lungs are clear. If you have an incision (the cut made at the time of surgery), support your incision when coughing by placing a pillow or rolled up towels firmly against it. Once you are able to  get out of bed, walk around indoors and cough well. You may stop using the incentive spirometer when instructed by your caregiver.  RISKS AND COMPLICATIONS  Take your time so you do not get dizzy or light-headed.  If you are in pain, you may need to take or ask for pain medication before doing incentive spirometry. It is harder to take a deep breath if you are having  pain. AFTER USE  Rest and breathe slowly and easily.  It can be helpful to keep track of a log of your progress. Your caregiver can provide you with a simple table to help with this. If you are using the spirometer at home, follow these instructions: [place] IF:   You are having difficultly using the spirometer.  You have trouble using the spirometer as often as instructed.  Your pain medication is not giving enough relief while using the spirometer.  You develop fever of 100.5 F (38.1 C) or higher. SEEK IMMEDIATE MEDICAL CARE IF:   You cough up bloody sputum that had not been present before.  You develop fever of 102 F (38.9 C) or greater.  You develop worsening pain at or near the incision site. MAKE SURE YOU:   Understand these instructions.  Will watch your condition.  Will get help right away if you are not doing well or get worse. Document Released: 06/22/2006 Document Revised: 05/04/2011 Document Reviewed: 08/23/2006 ExitCare Patient Information 2014 Exit[employer], [place].   ________________________________________________________________________  WHAT IS A BLOOD TRANSFUSION? Blood Transfusion Information  A transfusion is the replacement of blood or some of its parts. Blood is made up of multiple cells which provide different functions.  Red blood cells carry oxygen and are used for blood loss replacement.  White blood cells fight against infection.  Platelets control bleeding.  Plasma helps clot blood.  Other blood products are available for specialized needs, such as hemophilia or other clotting disorders. BEFORE THE TRANSFUSION  Who gives blood for transfusions?   Healthy volunteers who are fully evaluated to make sure their blood is safe. This is blood bank blood. Transfusion therapy is the safest it has ever been in the practice of medicine. Before blood is taken from a donor, a complete history is taken to make sure that person has no history  of diseases nor engages in risky social behavior (examples are intravenous drug use or sexual activity with multiple partners). The donor's travel history is screened to minimize risk of transmitting infections, such as malaria. The donated blood is tested for signs of infectious diseases, such as HIV and hepatitis. The blood is then tested to be sure it is compatible with you in order to minimize the chance of a transfusion reaction. If you or a relative donates blood, this is often done in anticipation of surgery and is not appropriate for emergency situations. It takes many days to process the donated blood. RISKS AND COMPLICATIONS Although transfusion therapy is very safe and saves many lives, the main dangers of transfusion include:   Getting an infectious disease.  Developing a transfusion reaction. This is an allergic reaction to something in the blood you were given. Every precaution is taken to prevent this. The decision to have a blood transfusion has been considered carefully by your caregiver before blood is given. Blood is not given unless the benefits outweigh the risks. AFTER THE TRANSFUSION  Right after receiving a blood transfusion, you will usually feel much better and more energetic. This is especially true if  your red blood cells have gotten low (anemic). The transfusion raises the level of the red blood cells which carry oxygen, and this usually causes an energy increase.  The nurse administering the transfusion will monitor you carefully for complications. HOME CARE INSTRUCTIONS  No special instructions are needed after a transfusion. You may find your energy is better. Speak with your caregiver about any limitations on activity for underlying diseases you may have. SEEK MEDICAL CARE IF:   Your condition is not improving after your transfusion.  You develop redness or irritation at the intravenous (IV) site. SEEK IMMEDIATE MEDICAL CARE IF:  Any of the following symptoms  occur over the next 12 hours:  Shaking chills.  You have a temperature by mouth above 102 F (38.9 C), not controlled by medicine.  Chest, back, or muscle pain.  People around you feel you are not acting correctly or are confused.  Shortness of breath or difficulty breathing.  Dizziness and fainting.  You get a rash or develop hives.  You have a decrease in urine output.  Your urine turns a dark color or changes to pink, red, or brown. Any of the following symptoms occur over the next 10 days:  You have a temperature by mouth above 102 F (38.9 C), not controlled by medicine.  Shortness of breath.  Weakness after normal activity.  The white part of the eye turns yellow (jaundice).  You have a decrease in the amount of urine or are urinating less often.  Your urine turns a dark color or changes to pink, red, or brown. Document Released: 02/07/2000 Document Revised: 05/04/2011 Document Reviewed: 09/26/2007 [facility] Patient Information 2014 [place], [place].  _______________________________________________________________________

## 2016-10-13 ENCOUNTER — Encounter (HOSPITAL_COMMUNITY): Payer: Self-pay

## 2016-10-13 ENCOUNTER — Encounter (HOSPITAL_COMMUNITY)
Admission: RE | Admit: 2016-10-13 | Discharge: 2016-10-13 | Disposition: A | Discharge status: Home / Self Care | Payer: 59 | Source: Ambulatory Visit | Attending: Orthopedic Surgery | Admitting: Orthopedic Surgery

## 2016-10-13 DIAGNOSIS — I10 Essential (primary) hypertension: Secondary | ICD-10-CM | POA: Diagnosis not present

## 2016-10-13 DIAGNOSIS — Z01818 Encounter for other preprocedural examination: Secondary | ICD-10-CM | POA: Insufficient documentation

## 2016-10-13 DIAGNOSIS — M1712 Unilateral primary osteoarthritis, left knee: Secondary | ICD-10-CM | POA: Diagnosis not present

## 2016-10-13 DIAGNOSIS — I493 Ventricular premature depolarization: Secondary | ICD-10-CM | POA: Diagnosis not present

## 2016-10-13 DIAGNOSIS — Z01810 Encounter for preprocedural cardiovascular examination: Secondary | ICD-10-CM | POA: Diagnosis not present

## 2016-10-13 HISTORY — DX: Foot drop, right foot: M21.371

## 2016-10-13 HISTORY — DX: Other specified postprocedural states: Z98.890

## 2016-10-13 HISTORY — DX: Other specified postprocedural states: R11.2

## 2016-10-13 LAB — CBC
HEMATOCRIT: 37.9 % (ref 36.0–46.0)
HEMOGLOBIN: 13.3 g/dL (ref 12.0–15.0)
MCH: 30.5 pg (ref 26.0–34.0)
MCHC: 35.1 g/dL (ref 30.0–36.0)
MCV: 86.9 fL (ref 78.0–100.0)
Platelets: 275 10*3/uL (ref 150–400)
RBC: 4.36 MIL/uL (ref 3.87–5.11)
RDW: 12.7 % (ref 11.5–15.5)
WBC: 7.4 10*3/uL (ref 4.0–10.5)

## 2016-10-13 LAB — SURGICAL PCR SCREEN
MRSA, PCR: NEGATIVE
STAPHYLOCOCCUS AUREUS: NEGATIVE

## 2016-10-15 NOTE — Progress Notes (Signed)
RN spoke with patient to make aware of surgery time change. Pt will report to 1st floor short stay at [place] for surgery to start at 0700. Pt given directions over the phone on how to find 1st floor short stay as admitting will not be open at that time to walk her over. Pt also instructed  to call 1st floor short stay number [phone] if she gets lost in hospital day of surgery. Pt verbalized understanding .

## 2016-10-19 ENCOUNTER — Ambulatory Visit (HOSPITAL_COMMUNITY): Payer: 59 | Admitting: Anesthesiology

## 2016-10-19 ENCOUNTER — Observation Stay (HOSPITAL_COMMUNITY)
Admission: RE | Admit: 2016-10-19 | Discharge: 2016-10-20 | Disposition: A | Discharge status: Home / Self Care | Payer: 59 | Source: Ambulatory Visit | Attending: Orthopedic Surgery | Admitting: Orthopedic Surgery

## 2016-10-19 ENCOUNTER — Encounter (HOSPITAL_COMMUNITY)
Admission: RE | Disposition: A | Discharge status: Home / Self Care | Payer: Self-pay | Source: Ambulatory Visit | Attending: Orthopedic Surgery

## 2016-10-19 ENCOUNTER — Encounter (HOSPITAL_COMMUNITY): Payer: Self-pay | Admitting: *Deleted

## 2016-10-19 DIAGNOSIS — I10 Essential (primary) hypertension: Secondary | ICD-10-CM | POA: Diagnosis not present

## 2016-10-19 DIAGNOSIS — Z888 Allergy status to other drugs, medicaments and biological substances status: Secondary | ICD-10-CM | POA: Insufficient documentation

## 2016-10-19 DIAGNOSIS — E559 Vitamin D deficiency, unspecified: Secondary | ICD-10-CM | POA: Diagnosis not present

## 2016-10-19 DIAGNOSIS — M25762 Osteophyte, left knee: Secondary | ICD-10-CM | POA: Diagnosis not present

## 2016-10-19 DIAGNOSIS — E782 Mixed hyperlipidemia: Secondary | ICD-10-CM | POA: Insufficient documentation

## 2016-10-19 DIAGNOSIS — E034 Atrophy of thyroid (acquired): Secondary | ICD-10-CM | POA: Insufficient documentation

## 2016-10-19 DIAGNOSIS — M1712 Unilateral primary osteoarthritis, left knee: Secondary | ICD-10-CM | POA: Diagnosis not present

## 2016-10-19 DIAGNOSIS — Z882 Allergy status to sulfonamides status: Secondary | ICD-10-CM | POA: Insufficient documentation

## 2016-10-19 DIAGNOSIS — Z79899 Other long term (current) drug therapy: Secondary | ICD-10-CM | POA: Diagnosis not present

## 2016-10-19 DIAGNOSIS — K219 Gastro-esophageal reflux disease without esophagitis: Secondary | ICD-10-CM | POA: Diagnosis not present

## 2016-10-19 DIAGNOSIS — M25462 Effusion, left knee: Secondary | ICD-10-CM | POA: Insufficient documentation

## 2016-10-19 DIAGNOSIS — Z96659 Presence of unspecified artificial knee joint: Secondary | ICD-10-CM

## 2016-10-19 DIAGNOSIS — Z7982 Long term (current) use of aspirin: Secondary | ICD-10-CM | POA: Insufficient documentation

## 2016-10-19 DIAGNOSIS — Z96651 Presence of right artificial knee joint: Secondary | ICD-10-CM | POA: Diagnosis not present

## 2016-10-19 DIAGNOSIS — G8918 Other acute postprocedural pain: Secondary | ICD-10-CM | POA: Diagnosis not present

## 2016-10-19 DIAGNOSIS — M65862 Other synovitis and tenosynovitis, left lower leg: Secondary | ICD-10-CM | POA: Diagnosis not present

## 2016-10-19 HISTORY — PX: TOTAL KNEE ARTHROPLASTY: SHX125

## 2016-10-19 LAB — TYPE AND SCREEN
ABO/RH(D): O POS
Antibody Screen: NEGATIVE

## 2016-10-19 SURGERY — ARTHROPLASTY, KNEE, TOTAL
Anesthesia: Spinal | Site: Knee | Laterality: Left

## 2016-10-19 MED ORDER — FERROUS SULFATE 325 (65 FE) MG PO TABS
325.0000 mg | ORAL_TABLET | Freq: Three times a day (TID) | ORAL | Status: DC
  Administered 2016-10-20 (×2): 325 mg via ORAL
  Filled 2016-10-19 (×2): qty 1

## 2016-10-19 MED ORDER — ONDANSETRON HCL 4 MG/2ML IJ SOLN
INTRAMUSCULAR | Status: AC
  Filled 2016-10-19: qty 2

## 2016-10-19 MED ORDER — METHOCARBAMOL 500 MG PO TABS
500.0000 mg | ORAL_TABLET | Freq: Four times a day (QID) | ORAL | Status: DC | PRN
  Administered 2016-10-19 – 2016-10-20 (×3): 500 mg via ORAL
  Filled 2016-10-19 (×3): qty 1

## 2016-10-19 MED ORDER — FENTANYL CITRATE (PF) 100 MCG/2ML IJ SOLN: 25.0000 ug | INTRAMUSCULAR | Status: DC | PRN

## 2016-10-19 MED ORDER — PROPOFOL 10 MG/ML IV BOLUS
INTRAVENOUS | Status: AC
  Filled 2016-10-19: qty 40

## 2016-10-19 MED ORDER — FERROUS SULFATE 325 (65 FE) MG PO TABS: 325.0000 mg | ORAL_TABLET | Freq: Three times a day (TID) | ORAL | 3 refills | Status: DC

## 2016-10-19 MED ORDER — MENTHOL 3 MG MT LOZG: 1.0000 | LOZENGE | OROMUCOSAL | Status: DC | PRN

## 2016-10-19 MED ORDER — SODIUM CHLORIDE 0.9 % IV SOLN
INTRAVENOUS | Status: DC
  Administered 2016-10-19: 12:00:00 via INTRAVENOUS

## 2016-10-19 MED ORDER — FLUTICASONE PROPIONATE 50 MCG/ACT NA SUSP
2.0000 | Freq: Every day | NASAL | Status: DC
  Administered 2016-10-19 – 2016-10-20 (×2): 2 via NASAL
  Filled 2016-10-19: qty 16

## 2016-10-19 MED ORDER — POLYETHYLENE GLYCOL 3350 17 G PO PACK
17.0000 g | PACK | Freq: Two times a day (BID) | ORAL | Status: DC
  Administered 2016-10-19 – 2016-10-20 (×2): 17 g via ORAL
  Filled 2016-10-19 (×2): qty 1

## 2016-10-19 MED ORDER — PROPOFOL 500 MG/50ML IV EMUL
INTRAVENOUS | Status: DC | PRN
  Administered 2016-10-19: 100 ug/kg/min via INTRAVENOUS

## 2016-10-19 MED ORDER — METHOCARBAMOL 1000 MG/10ML IJ SOLN
500.0000 mg | Freq: Four times a day (QID) | INTRAVENOUS | Status: DC | PRN
  Administered 2016-10-19: 500 mg via INTRAVENOUS
  Filled 2016-10-19: qty 550

## 2016-10-19 MED ORDER — FENTANYL CITRATE (PF) 100 MCG/2ML IJ SOLN
INTRAMUSCULAR | Status: AC
  Filled 2016-10-19: qty 2

## 2016-10-19 MED ORDER — SODIUM CHLORIDE 0.9 % IJ SOLN
INTRAMUSCULAR | Status: AC
  Filled 2016-10-19: qty 50

## 2016-10-19 MED ORDER — DOCUSATE SODIUM 100 MG PO CAPS
100.0000 mg | ORAL_CAPSULE | Freq: Two times a day (BID) | ORAL | Status: DC
  Administered 2016-10-19 – 2016-10-20 (×2): 100 mg via ORAL
  Filled 2016-10-19 (×2): qty 1

## 2016-10-19 MED ORDER — MIDAZOLAM HCL 2 MG/2ML IJ SOLN
INTRAMUSCULAR | Status: AC
  Filled 2016-10-19: qty 2

## 2016-10-19 MED ORDER — ONDANSETRON HCL 4 MG PO TABS: 4.0000 mg | ORAL_TABLET | Freq: Four times a day (QID) | ORAL | Status: DC | PRN

## 2016-10-19 MED ORDER — BUPIVACAINE-EPINEPHRINE (PF) 0.25% -1:200000 IJ SOLN
INTRAMUSCULAR | Status: AC
  Filled 2016-10-19: qty 30

## 2016-10-19 MED ORDER — ASPIRIN 81 MG PO CHEW: 81.0000 mg | CHEWABLE_TABLET | Freq: Two times a day (BID) | ORAL | 0 refills | Status: AC

## 2016-10-19 MED ORDER — KETOROLAC TROMETHAMINE 30 MG/ML IJ SOLN
INTRAMUSCULAR | Status: AC
  Filled 2016-10-19: qty 1

## 2016-10-19 MED ORDER — HYDROCODONE-ACETAMINOPHEN 7.5-325 MG PO TABS: 1.0000 | ORAL_TABLET | ORAL | 0 refills | Status: DC | PRN

## 2016-10-19 MED ORDER — METOCLOPRAMIDE HCL 5 MG PO TABS
5.0000 mg | ORAL_TABLET | Freq: Three times a day (TID) | ORAL | Status: DC | PRN
  Administered 2016-10-19: 5 mg via ORAL
  Filled 2016-10-19: qty 1

## 2016-10-19 MED ORDER — ASPIRIN 81 MG PO CHEW
81.0000 mg | CHEWABLE_TABLET | Freq: Two times a day (BID) | ORAL | Status: DC
  Administered 2016-10-19 – 2016-10-20 (×2): 81 mg via ORAL
  Filled 2016-10-19 (×2): qty 1

## 2016-10-19 MED ORDER — ONDANSETRON HCL 4 MG/2ML IJ SOLN
4.0000 mg | Freq: Four times a day (QID) | INTRAMUSCULAR | Status: DC | PRN
  Administered 2016-10-19: 4 mg via INTRAVENOUS
  Filled 2016-10-19: qty 2

## 2016-10-19 MED ORDER — FENTANYL CITRATE (PF) 100 MCG/2ML IJ SOLN
100.0000 ug | Freq: Once | INTRAMUSCULAR | Status: AC
  Administered 2016-10-19 (×2): 50 ug via INTRAVENOUS

## 2016-10-19 MED ORDER — DIPHENHYDRAMINE HCL 25 MG PO CAPS: 25.0000 mg | ORAL_CAPSULE | Freq: Four times a day (QID) | ORAL | Status: DC | PRN

## 2016-10-19 MED ORDER — KETOROLAC TROMETHAMINE 30 MG/ML IJ SOLN
INTRAMUSCULAR | Status: DC | PRN
  Administered 2016-10-19: 30 mg via INTRAVENOUS

## 2016-10-19 MED ORDER — ALUM & MAG HYDROXIDE-SIMETH 200-200-20 MG/5ML PO SUSP: 15.0000 mL | ORAL | Status: DC | PRN

## 2016-10-19 MED ORDER — CEFAZOLIN SODIUM-DEXTROSE 2-4 GM/100ML-% IV SOLN
2.0000 g | INTRAVENOUS | Status: AC
  Administered 2016-10-19: 2 g via INTRAVENOUS

## 2016-10-19 MED ORDER — SODIUM CHLORIDE 0.9 % IJ SOLN
INTRAMUSCULAR | Status: AC
  Filled 2016-10-19: qty 10

## 2016-10-19 MED ORDER — OMEPRAZOLE 20 MG PO CPDR
20.0000 mg | DELAYED_RELEASE_CAPSULE | Freq: Two times a day (BID) | ORAL | Status: DC
  Administered 2016-10-19 – 2016-10-20 (×2): 20 mg via ORAL
  Filled 2016-10-19 (×2): qty 1

## 2016-10-19 MED ORDER — CEFAZOLIN SODIUM-DEXTROSE 2-4 GM/100ML-% IV SOLN
INTRAVENOUS | Status: AC
  Filled 2016-10-19: qty 100

## 2016-10-19 MED ORDER — CHLORHEXIDINE GLUCONATE 4 % EX LIQD: 60.0000 mL | Freq: Once | CUTANEOUS | Status: DC

## 2016-10-19 MED ORDER — PHENOL 1.4 % MT LIQD
1.0000 | OROMUCOSAL | Status: DC | PRN
  Filled 2016-10-19: qty 177

## 2016-10-19 MED ORDER — BUPIVACAINE-EPINEPHRINE (PF) 0.25% -1:200000 IJ SOLN
INTRAMUSCULAR | Status: DC | PRN
  Administered 2016-10-19: 30 mL via PERINEURAL

## 2016-10-19 MED ORDER — METHOCARBAMOL 500 MG PO TABS: 500.0000 mg | ORAL_TABLET | Freq: Four times a day (QID) | ORAL | 0 refills | Status: DC | PRN

## 2016-10-19 MED ORDER — POLYETHYLENE GLYCOL 3350 17 G PO PACK: 17.0000 g | PACK | Freq: Two times a day (BID) | ORAL | 0 refills | Status: DC

## 2016-10-19 MED ORDER — MAGNESIUM CITRATE PO SOLN: 1.0000 | Freq: Once | ORAL | Status: DC | PRN

## 2016-10-19 MED ORDER — LACTATED RINGERS IV SOLN
INTRAVENOUS | Status: DC
  Administered 2016-10-19 (×2): via INTRAVENOUS

## 2016-10-19 MED ORDER — PHENYLEPHRINE 40 MCG/ML (10ML) SYRINGE FOR IV PUSH (FOR BLOOD PRESSURE SUPPORT)
PREFILLED_SYRINGE | INTRAVENOUS | Status: AC
  Filled 2016-10-19: qty 10

## 2016-10-19 MED ORDER — HYDROMORPHONE HCL-NACL 0.5-0.9 MG/ML-% IV SOSY: 0.2500 mg | PREFILLED_SYRINGE | INTRAVENOUS | Status: DC | PRN

## 2016-10-19 MED ORDER — ONDANSETRON HCL 4 MG/2ML IJ SOLN
INTRAMUSCULAR | Status: DC | PRN
  Administered 2016-10-19: 4 mg via INTRAVENOUS

## 2016-10-19 MED ORDER — SODIUM CHLORIDE 0.9 % IJ SOLN
INTRAMUSCULAR | Status: DC | PRN
  Administered 2016-10-19: 30 mL via INTRAVENOUS

## 2016-10-19 MED ORDER — SODIUM CHLORIDE 0.9 % IV SOLN
1000.0000 mg | Freq: Once | INTRAVENOUS | Status: AC
  Administered 2016-10-19: 1000 mg via INTRAVENOUS
  Filled 2016-10-19: qty 1100

## 2016-10-19 MED ORDER — LEVOTHYROXINE SODIUM 25 MCG PO TABS
25.0000 ug | ORAL_TABLET | Freq: Every day | ORAL | Status: DC
  Administered 2016-10-20: 25 ug via ORAL
  Filled 2016-10-19: qty 1

## 2016-10-19 MED ORDER — HYDROCHLOROTHIAZIDE 25 MG PO TABS
25.0000 mg | ORAL_TABLET | Freq: Every day | ORAL | Status: DC
  Administered 2016-10-20: 25 mg via ORAL
  Filled 2016-10-19: qty 1

## 2016-10-19 MED ORDER — METOCLOPRAMIDE HCL 5 MG/ML IJ SOLN: 5.0000 mg | Freq: Three times a day (TID) | INTRAMUSCULAR | Status: DC | PRN

## 2016-10-19 MED ORDER — MIDAZOLAM HCL 2 MG/2ML IJ SOLN
2.0000 mg | Freq: Once | INTRAMUSCULAR | Status: AC
  Administered 2016-10-19 (×2): 1 mg via INTRAVENOUS

## 2016-10-19 MED ORDER — DOCUSATE SODIUM 100 MG PO CAPS: 100.0000 mg | ORAL_CAPSULE | Freq: Two times a day (BID) | ORAL | 0 refills | Status: DC

## 2016-10-19 MED ORDER — PHENYLEPHRINE HCL 10 MG/ML IJ SOLN
INTRAMUSCULAR | Status: DC | PRN
  Administered 2016-10-19 (×2): 80 ug via INTRAVENOUS

## 2016-10-19 MED ORDER — DEXAMETHASONE SODIUM PHOSPHATE 10 MG/ML IJ SOLN
10.0000 mg | Freq: Once | INTRAMUSCULAR | Status: AC
  Administered 2016-10-20: 10 mg via INTRAVENOUS
  Filled 2016-10-19: qty 1

## 2016-10-19 MED ORDER — BUPIVACAINE IN DEXTROSE 0.75-8.25 % IT SOLN
INTRATHECAL | Status: DC | PRN
  Administered 2016-10-19: 1.6 mL via INTRATHECAL

## 2016-10-19 MED ORDER — BISACODYL 10 MG RE SUPP: 10.0000 mg | Freq: Every day | RECTAL | Status: DC | PRN

## 2016-10-19 MED ORDER — HYDROCODONE-ACETAMINOPHEN 7.5-325 MG PO TABS
1.0000 | ORAL_TABLET | ORAL | Status: DC
  Administered 2016-10-19 (×4): 1 via ORAL
  Administered 2016-10-20: 2 via ORAL
  Administered 2016-10-20: 1 via ORAL
  Administered 2016-10-20: 2 via ORAL
  Administered 2016-10-20: 1 via ORAL
  Filled 2016-10-19: qty 2
  Filled 2016-10-19: qty 1
  Filled 2016-10-19: qty 2
  Filled 2016-10-19 (×4): qty 1
  Filled 2016-10-19: qty 2

## 2016-10-19 MED ORDER — CEFAZOLIN SODIUM-DEXTROSE 2-4 GM/100ML-% IV SOLN
2.0000 g | Freq: Four times a day (QID) | INTRAVENOUS | Status: AC
  Administered 2016-10-19 (×2): 2 g via INTRAVENOUS
  Filled 2016-10-19 (×2): qty 100

## 2016-10-19 MED ORDER — PROMETHAZINE HCL 25 MG/ML IJ SOLN: 6.2500 mg | INTRAMUSCULAR | Status: DC | PRN

## 2016-10-19 MED ORDER — ROPIVACAINE HCL 5 MG/ML IJ SOLN
INTRAMUSCULAR | Status: DC | PRN
  Administered 2016-10-19: 20 mL via PERINEURAL

## 2016-10-19 MED ORDER — TRANEXAMIC ACID 1000 MG/10ML IV SOLN
1000.0000 mg | INTRAVENOUS | Status: AC
  Administered 2016-10-19: 1000 mg via INTRAVENOUS
  Filled 2016-10-19: qty 10

## 2016-10-19 MED ORDER — DEXAMETHASONE SODIUM PHOSPHATE 10 MG/ML IJ SOLN
10.0000 mg | Freq: Once | INTRAMUSCULAR | Status: AC
  Administered 2016-10-19: 10 mg via INTRAVENOUS

## 2016-10-19 SURGICAL SUPPLY — 45 items
BAG DECANTER FOR FLEXI CONT (MISCELLANEOUS) ×2 IMPLANT
BAG ZIPLOCK 12X15 (MISCELLANEOUS) IMPLANT
BANDAGE ACE 6X5 VEL STRL LF (GAUZE/BANDAGES/DRESSINGS) ×2 IMPLANT
BLADE SAW SGTL 11.0X1.19X90.0M (BLADE) ×2 IMPLANT
BLADE SAW SGTL 13.0X1.19X90.0M (BLADE) ×2 IMPLANT
BOWL SMART MIX CTS (DISPOSABLE) ×2 IMPLANT
CAPT KNEE TOTAL 3 ATTUNE ×2 IMPLANT
CEMENT HV SMART SET (Cement) ×4 IMPLANT
COVER SURGICAL LIGHT HANDLE (MISCELLANEOUS) ×2 IMPLANT
CUFF TOURN SGL QUICK 34 (TOURNIQUET CUFF) ×1
CUFF TRNQT CYL 34X4X40X1 (TOURNIQUET CUFF) ×1 IMPLANT
DECANTER SPIKE VIAL GLASS SM (MISCELLANEOUS) ×2 IMPLANT
DERMABOND ADVANCED (GAUZE/BANDAGES/DRESSINGS) ×1
DERMABOND ADVANCED .7 DNX12 (GAUZE/BANDAGES/DRESSINGS) ×1 IMPLANT
DRAPE U-SHAPE 47X51 STRL (DRAPES) ×2 IMPLANT
DRESSING AQUACEL AG SP 3.5X10 (GAUZE/BANDAGES/DRESSINGS) ×1 IMPLANT
DRSG AQUACEL AG SP 3.5X10 (GAUZE/BANDAGES/DRESSINGS) ×2
DURAPREP 26ML APPLICATOR (WOUND CARE) ×4 IMPLANT
ELECT REM PT RETURN 15FT ADLT (MISCELLANEOUS) ×2 IMPLANT
GLOVE BIOGEL M 7.0 STRL (GLOVE) ×6 IMPLANT
GLOVE BIOGEL PI IND STRL 7.5 (GLOVE) ×2 IMPLANT
GLOVE BIOGEL PI IND STRL 8.5 (GLOVE) ×2 IMPLANT
GLOVE BIOGEL PI INDICATOR 7.5 (GLOVE) ×2
GLOVE BIOGEL PI INDICATOR 8.5 (GLOVE) ×2
GLOVE ECLIPSE 8.0 STRL XLNG CF (GLOVE) ×2 IMPLANT
GLOVE ORTHO TXT STRL SZ7.5 (GLOVE) ×4 IMPLANT
GOWN STRL REUS W/TWL LRG LVL3 (GOWN DISPOSABLE) ×2 IMPLANT
GOWN STRL REUS W/TWL XL LVL3 (GOWN DISPOSABLE) ×6 IMPLANT
HANDPIECE INTERPULSE COAX TIP (DISPOSABLE) ×1
MANIFOLD NEPTUNE II (INSTRUMENTS) ×2 IMPLANT
PACK TOTAL KNEE CUSTOM (KITS) ×2 IMPLANT
POSITIONER SURGICAL ARM (MISCELLANEOUS) ×2 IMPLANT
SET HNDPC FAN SPRY TIP SCT (DISPOSABLE) ×1 IMPLANT
SET PAD KNEE POSITIONER (MISCELLANEOUS) ×2 IMPLANT
SUT MNCRL AB 4-0 PS2 18 (SUTURE) ×2 IMPLANT
SUT STRATAFIX 0 PDS 27 VIOLET (SUTURE) ×2
SUT VIC AB 1 CT1 36 (SUTURE) ×2 IMPLANT
SUT VIC AB 2-0 CT1 27 (SUTURE) ×3
SUT VIC AB 2-0 CT1 TAPERPNT 27 (SUTURE) ×3 IMPLANT
SUTURE STRATFX 0 PDS 27 VIOLET (SUTURE) ×1 IMPLANT
SYR 50ML LL SCALE MARK (SYRINGE) ×2 IMPLANT
TRAY FOLEY W/METER SILVER 16FR (SET/KITS/TRAYS/PACK) ×2 IMPLANT
WATER STERILE IRR 1500ML POUR (IV SOLUTION) ×2 IMPLANT
WRAP KNEE MAXI GEL POST OP (GAUZE/BANDAGES/DRESSINGS) ×2 IMPLANT
YANKAUER SUCT BULB TIP 10FT TU (MISCELLANEOUS) ×2 IMPLANT

## 2016-10-19 NOTE — Discharge Instructions (Signed)

## 2016-10-19 NOTE — Anesthesia Preprocedure Evaluation (Addendum)
Anesthesia Evaluation  Patient identified by MRN, date of birth, ID band Patient awake    Reviewed: Allergy & Precautions, NPO status , Patient's Chart, lab work & pertinent test results  History of Anesthesia Complications (+) Family history of anesthesia reaction  Airway Mallampati: II  TM Distance: >3 FB Neck ROM: Full    Dental no notable dental hx.    Pulmonary    breath sounds clear to auscultation       Cardiovascular hypertension,  Rhythm:Regular Rate:Normal     Neuro/Psych negative neurological ROS     GI/Hepatic GERD  ,  Endo/Other    Renal/GU      Musculoskeletal  (+) Arthritis ,   Abdominal   Peds  Hematology   Anesthesia Other Findings   Reproductive/Obstetrics                           Anesthesia Physical Anesthesia Plan  ASA: II  Anesthesia Plan: Spinal   Post-op Pain Management:  Regional for Post-op pain   Induction: Intravenous  PONV Risk Score and Plan: 2 and Ondansetron and Dexamethasone  Airway Management Planned: Natural Airway  Additional Equipment:   Intra-op Plan:   Post-operative Plan:   Informed Consent: I have reviewed the patients History and Physical, chart, labs and discussed the procedure including the risks, benefits and alternatives for the proposed anesthesia with the patient or authorized representative who has indicated his/her understanding and acceptance.     Plan Discussed with: CRNA  Anesthesia Plan Comments:         Anesthesia Quick Evaluation

## 2016-10-19 NOTE — Anesthesia Procedure Notes (Signed)
Date/Time: 10/19/2016 7:19 AM Performed by: Glory Buff Oxygen Delivery Method: Nasal cannula

## 2016-10-19 NOTE — Anesthesia Procedure Notes (Signed)
Spinal  Patient location during procedure: OR Start time: 10/19/2016 7:25 AM End time: 10/19/2016 7:28 AM Staffing Anesthesiologist: Rica Koyanagi Resident/CRNA: Janasia Coverdale G Performed: resident/CRNA  Preanesthetic Checklist Completed: patient identified, site marked, surgical consent, pre-op evaluation, timeout performed, IV checked, risks and benefits discussed and monitors and equipment checked Spinal Block Patient position: sitting Prep: DuraPrep Patient monitoring: heart rate, continuous pulse ox and blood pressure Approach: midline Location: L3-4 Injection technique: single-shot Needle Needle type: Pencan  Needle gauge: 24 G Needle length: 9 cm Needle insertion depth: 5 cm Assessment Sensory level: T6 Additional Notes Kit expiration date checked and verified.  Stick x 1, - heme, -paresthesia, + CSF pre and post injection, patient tolerated well.

## 2016-10-19 NOTE — Anesthesia Procedure Notes (Addendum)
Anesthesia Regional Block: Adductor canal block   Pre-Anesthetic Checklist: ,, timeout performed, Correct Patient, Correct Site, Correct Laterality, Correct Procedure, Correct Position, site marked, Risks and benefits discussed,  Surgical consent,  Pre-op evaluation,  At surgeon's request and post-op pain management  Laterality: Left and Lower  Prep: chloraprep       Needles:      Needle Length: 9cm  Needle Gauge: 21   Needle insertion depth: 5 cm   Additional Needles:   Procedures: ultrasound guided,,,,,,,,  Narrative:  Start time: 10/19/2016 6:50 AM End time: 10/19/2016 7:00 AM Injection made incrementally with aspirations every 5 mL.  Performed by: Personally  Anesthesiologist: Kenlyn Lose

## 2016-10-19 NOTE — Op Note (Signed)
NAME:  Julie Castillo                      MEDICAL RECORD NO.:  [id number]                             FACILITY:  [facility]      PHYSICIAN:  Pietro Cassis. Alvan Dame, M.D.  DATE OF BIRTH:  1955/06/28      DATE OF PROCEDURE:  10/19/2016                                     OPERATIVE REPORT         PREOPERATIVE DIAGNOSIS:  Left knee osteoarthritis.      POSTOPERATIVE DIAGNOSIS:  Left knee osteoarthritis.      FINDINGS:  The patient was noted to have complete loss of cartilage and   bone-on-bone arthritis with associated osteophytes in the lateral and patellofemoral compartments of   the knee with a significant synovitis and associated effusion.      PROCEDURE:  Left total knee replacement.      COMPONENTS USED:  DePuy Attune rotating platform posterior stabilized knee   system, a size 4N femur, 4 tibia, size 6 mm PS AOX insert, and 32 anatomic patellar   button.      SURGEON:  Pietro Cassis. Alvan Dame, M.D.      ASSISTANT:  Danae Orleans, PA-C.      ANESTHESIA:  Regional and Spinal.      SPECIMENS:  None.      COMPLICATION:  None.      DRAINS:  None.  EBL: <100cc      TOURNIQUET TIME:   Total Tourniquet Time Documented: Thigh (Left) - 29 minutes Total: Thigh (Left) - 29 minutes  .      The patient was stable to the recovery room.      INDICATION FOR PROCEDURE:  Julie Castillo is a 61 y.o. female patient of   mine.  The patient had been seen, evaluated, and treated conservatively in the   office with medication, activity modification, and injections.  The patient had   radiographic changes of bone-on-bone arthritis with endplate sclerosis and osteophytes noted.      The patient failed conservative measures including medication, injections, and activity modification, and at this point was ready for more definitive measures.   Based on the radiographic changes and failed conservative measures, the patient   decided to proceed with total knee replacement.  Risks of infection,   DVT, component  failure, need for revision surgery, postop course, and   expectations were all   discussed and reviewed.  Consent was obtained for benefit of pain   relief.      PROCEDURE IN DETAIL:  The patient was brought to the operative theater.   Once adequate anesthesia, preoperative antibiotics, 2 gm of Ancef, 1 gm of Tranexamic Acid, and 10 mg of Decadron administered, the patient was positioned supine with the left thigh tourniquet placed.  The  left lower extremity was prepped and draped in sterile fashion.  A time-   out was performed identifying the patient, planned procedure, and   extremity.      The left lower extremity was placed in the [facility] leg holder.  The leg was   exsanguinated, tourniquet elevated to 250 mmHg.  A midline incision was  made followed by median parapatellar arthrotomy.  Following initial   exposure, attention was first directed to the patella.  Precut   measurement was noted to be 20 mm.  I resected down to 13 mm and used a   32 anatomic patellar button to restore patellar height as well as cover the cut   surface.      The lug holes were drilled and a metal shim was placed to protect the   patella from retractors and saw blades.      At this point, attention was now directed to the femur.  The femoral   canal was opened with a drill, irrigated to try to prevent fat emboli.  An   intramedullary rod was passed at 3 degrees valgus, 9 mm of bone was   resected off the distal femur.  Following this resection, the tibia was   subluxated anteriorly.  Using the extramedullary guide, 2 mm of bone was resected off   the proximal lateral tibia.  We confirmed the gap would be   stable medially and laterally with a size 5 spacer block as well as confirmed   the cut was perpendicular in the coronal plane, checking with an alignment rod.      Once this was done, I sized the femur to be a size 4 in the anterior-   posterior dimension, chose a narrow component based on medial  and   lateral dimension.  The size 4 rotation block was then pinned in   position anterior referenced using the C-clamp to set rotation.  The   anterior, posterior, and  chamfer cuts were made without difficulty nor   notching making certain that I was along the anterior cortex to help   with flexion gap stability.      The final box cut was made off the lateral aspect of distal femur.      At this point, the tibia was sized to be a size 4, the size 4 tray was   then pinned in position through the medial third of the tubercle,   drilled, and keel punched.  Trial reduction was now carried with a 4 femur,  4 tibia, a size 5 then 6 mm PS insert, and the 32 anatomic patella botton.  The knee was brought to   extension, full extension with good flexion stability with the patella   tracking through the trochlea without application of pressure.  Given   all these findings the femoral lug holes were drilled and then the trial components removed.  Final components were   opened and cement was mixed.  The knee was irrigated with normal saline   solution and pulse lavage.  The synovial lining was   then injected with 30 cc of 0.25% Marcaine with epinephrine and 1 cc of Toradol plus 30 cc of NS for a  total of 61 cc.      The knee was irrigated.  Final implants were then cemented onto clean and   dried cut surfaces of bone with the knee brought to extension with a size 6 mm PS trial insert.      Once the cement had fully cured, the excess cement was removed   throughout the knee.  I confirmed I was satisfied with the range of   motion and stability, and the final size 6 mm PS AOX insert was chosen.  It was   placed into the knee.      The tourniquet had been  let down at 29 minutes.  No significant   hemostasis required.  The   extensor mechanism was then reapproximated using #1 Vicryl and #0 V-lock sutures with the knee   in flexion.  The   remaining wound was closed with 2-0 Vicryl and running  4-0 Monocryl.   The knee was cleaned, dried, dressed sterilely using Dermabond and   Aquacel dressing.  The patient was then   brought to recovery room in stable condition, tolerating the procedure   well.   Please note that Physician Assistant, Danae Orleans, PA-C, was present for the entirety of the case, and was utilized for pre-operative positioning, peri-operative retractor management, general facilitation of the procedure.  He was also utilized for primary wound closure at the end of the case.              Pietro Cassis Alvan Dame, M.D.    10/19/2016 8:45 AM

## 2016-10-19 NOTE — Transfer of Care (Signed)
Immediate Anesthesia Transfer of Care Note  Patient: Julie Castillo  Procedure(s) Performed: Procedure(s): LEFT TOTAL KNEE ARTHROPLASTY (Left)  Patient Location: PACU  Anesthesia Type:Spinal  Level of Consciousness: awake, alert  and oriented  Airway & Oxygen Therapy: Patient Spontanous Breathing and Patient connected to face mask oxygen  Post-op Assessment: Report given to RN and Post -op Vital signs reviewed and stable  Post vital signs: Reviewed and stable  Last Vitals:  Vitals:   10/19/16 0706 10/19/16 0715  BP:    Pulse: 81 85  Resp: 15 (!) 23  Temp:    SpO2: 100% 100%    Last Pain:  Vitals:   10/19/16 0558  TempSrc:   PainSc: 0-No pain         Complications: No apparent anesthesia complications

## 2016-10-19 NOTE — Interval H&P Note (Signed)
History and Physical Interval Note:  10/19/2016 7:10 AM  [name]  has presented today for surgery, with the diagnosis of Left knee osteoarthritis   The various methods of treatment have been discussed with the patient and family. After consideration of risks, benefits and other options for treatment, the patient has consented to  Procedure(s): LEFT TOTAL KNEE ARTHROPLASTY (Left) as a surgical intervention .  The patient's history has been reviewed, patient examined, no change in status, stable for surgery.  I have reviewed the patient's chart and labs.  Questions were answered to the patient's satisfaction.     Mauri Pole

## 2016-10-19 NOTE — Evaluation (Signed)
Physical Therapy Evaluation Patient Details Name: Julie Castillo MRN: [id number] DOB: 12/26/1955 Today's Date: 10/19/2016   History of Present Illness  61 yo female s/p L TKA; Hx: R TKA  Clinical Impression  Pt is s/p TKA resulting in the deficits listed below (see PT Problem List).  Pt will benefit from skilled PT to increase their independence and safety with mobility to allow discharge to the venue listed below.  Pt doing well, amb ~ 6' with RW and  min assist, limited by mild diaphoresis--resolved quickly once seated with VSS; will see in am, pt hopeful to D/C tomorrow     Follow Up Recommendations Outpatient PT;DC plan and follow up therapy as arranged by surgeon    Equipment Recommendations  None recommended by PT    Recommendations for Other Services       Precautions / Restrictions Precautions Precautions: Fall;Knee Restrictions Other Position/Activity Restrictions: WBAT      Mobility  Bed Mobility Overal bed mobility: Needs Assistance Bed Mobility: Supine to Sit     Supine to sit: Min guard     General bed mobility comments: for safety  Transfers Overall transfer level: Needs assistance Equipment used: Rolling walker (2 wheeled) Transfers: Sit to/from Stand Sit to Stand: Min guard         General transfer comment: cues for hand placement and LLE position  Ambulation/Gait Ambulation/Gait assistance: Min assist Ambulation Distance (Feet): 6 Feet (fwd and back to chair) Assistive device: Rolling walker (2 wheeled) Gait Pattern/deviations: Step-to pattern;Antalgic Gait velocity: deferred incr distance d/t pt feelind "very hot";  backed up to chair, VSS -- HR 70s, O2 sats 99-100% on RA   General Gait Details: cues for sequence and RW position  Stairs            Wheelchair Mobility    Modified Rankin (Stroke Patients Only)       Balance                                             Pertinent Vitals/Pain Pain Assessment:  0-10 Pain Score: 3  Pain Location: L knee  Pain Descriptors / Indicators: Sore Pain Intervention(s): Limited activity within patient's tolerance;Monitored during session;Repositioned    Home Living Family/patient expects to be discharged to:: Private residence Living Arrangements: Spouse/significant other Available Help at Discharge: Family Type of Home: House Home Access: Stairs to enter Entrance Stairs-Rails: Right;Left;Can reach both Technical brewer of Steps: 4--garage Home Layout: One level Home Equipment: Clinical cytogeneticist - 2 wheels;Cane - single point Additional Comments: borrowing shower seat;    Prior Function Level of Independence: Independent         Comments: works at [employer]        Extremity/Trunk Assessment   Upper Extremity Assessment Upper Extremity Assessment: Defer to OT evaluation;Overall [facility] for tasks assessed    Lower Extremity Assessment Lower Extremity Assessment: LLE deficits/detail;RLE deficits/detail RLE Deficits / Details: 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, AFO but doesn't use--df/AROM grossly WFL LLE Deficits / Details: ankle WFL,  knee extension adn hip flexion 2+/5; AAROM knee flexion 6*--60*       Communication   Communication: No difficulties  Cognition Arousal/Alertness: Awake/alert Behavior During Therapy: WFL for tasks assessed/performed Overall Cognitive Status: Within Functional Limits for tasks assessed  General Comments      Exercises Total Joint Exercises Ankle Circles/Pumps: AROM;Both;10 reps Quad Sets: Both;AROM;5 reps Heel Slides: Left;5 reps;AAROM   Assessment/Plan    PT Assessment Patient needs continued PT services  PT Problem List Decreased strength;Decreased activity tolerance;Decreased mobility;Decreased range of motion;Decreased knowledge of use of DME       PT Treatment Interventions DME instruction;Gait  training;Stair training;Functional mobility training;Therapeutic activities;Therapeutic exercise;Patient/family education    PT Goals (Current goals can be found in the Care Plan section)  Acute Rehab PT Goals Patient Stated Goal: home tomorrow PT Goal Formulation: With patient Time For Goal Achievement: 10/22/16 Potential to Achieve Goals: Good    Frequency 7X/week   Barriers to discharge        Co-evaluation               AM-PAC PT "6 Clicks" Daily Activity  Outcome Measure Difficulty turning over in bed (including adjusting bedclothes, sheets and blankets)?: Unable Difficulty moving from lying on back to sitting on the side of the bed? : Unable Difficulty sitting down on and standing up from a chair with arms (e.g., wheelchair, bedside commode, etc,.)?: Unable Help needed moving to and from a bed to chair (including a wheelchair)?: A Little Help needed walking in hospital room?: A Little Help needed climbing 3-5 steps with a railing? : A Little 6 Click Score: 12    End of Session Equipment Utilized During Treatment: Gait belt Activity Tolerance: Patient tolerated treatment well Patient left: with call bell/phone within reach;in chair Nurse Communication: Mobility status PT Visit Diagnosis: Difficulty in walking, not elsewhere classified (R26.2)    Time: 1418 (56min spent out of room for hospital admin to talke with pt)-1456 PT Time Calculation (min) (ACUTE ONLY): 38 min   Charges:   PT Evaluation $PT Eval Low Complexity: 1 Low PT Treatments $Therapeutic Activity: 8-22 mins   PT G Codes:   PT G-Codes **NOT FOR INPATIENT CLASS** Functional Assessment Tool Used: AM-PAC 6 Clicks Basic Mobility;Clinical judgement Functional Limitation: Mobility: Walking and moving around Mobility: Walking and Moving Around Current Status (B5830): At least 20 percent but less than 40 percent impaired, limited or restricted Mobility: Walking and Moving Around Goal Status [phone]): At  least 1 percent but less than 20 percent impaired, limited or restricted    Kenyon Ana, PT Pager: [phone] 10/19/2016   [facility] 10/19/2016, 3:07 PM

## 2016-10-19 NOTE — Progress Notes (Signed)
Assisted  Dr. Orene Desanctis with left, ultrasound guided, adductor canal block. Side rails up, monitors on throughout procedure. See vital signs in flow sheet. Tolerated Procedure well.

## 2016-10-20 DIAGNOSIS — M25462 Effusion, left knee: Secondary | ICD-10-CM | POA: Diagnosis not present

## 2016-10-20 DIAGNOSIS — E782 Mixed hyperlipidemia: Secondary | ICD-10-CM | POA: Diagnosis not present

## 2016-10-20 DIAGNOSIS — E559 Vitamin D deficiency, unspecified: Secondary | ICD-10-CM | POA: Diagnosis not present

## 2016-10-20 DIAGNOSIS — E034 Atrophy of thyroid (acquired): Secondary | ICD-10-CM | POA: Diagnosis not present

## 2016-10-20 DIAGNOSIS — M1712 Unilateral primary osteoarthritis, left knee: Secondary | ICD-10-CM | POA: Diagnosis not present

## 2016-10-20 DIAGNOSIS — M25762 Osteophyte, left knee: Secondary | ICD-10-CM | POA: Diagnosis not present

## 2016-10-20 DIAGNOSIS — I10 Essential (primary) hypertension: Secondary | ICD-10-CM | POA: Diagnosis not present

## 2016-10-20 DIAGNOSIS — M65862 Other synovitis and tenosynovitis, left lower leg: Secondary | ICD-10-CM | POA: Diagnosis not present

## 2016-10-20 DIAGNOSIS — K219 Gastro-esophageal reflux disease without esophagitis: Secondary | ICD-10-CM | POA: Diagnosis not present

## 2016-10-20 LAB — CBC
HEMATOCRIT: 27.8 % — AB (ref 36.0–46.0)
HEMOGLOBIN: 9.7 g/dL — AB (ref 12.0–15.0)
MCH: 30.6 pg (ref 26.0–34.0)
MCHC: 34.9 g/dL (ref 30.0–36.0)
MCV: 87.7 fL (ref 78.0–100.0)
Platelets: 186 10*3/uL (ref 150–400)
RBC: 3.17 MIL/uL — ABNORMAL LOW (ref 3.87–5.11)
RDW: 12.6 % (ref 11.5–15.5)
WBC: 9.6 10*3/uL (ref 4.0–10.5)

## 2016-10-20 LAB — BASIC METABOLIC PANEL
ANION GAP: 5 (ref 5–15)
BUN: 15 mg/dL (ref 6–20)
CHLORIDE: 94 mmol/L — AB (ref 101–111)
CO2: 29 mmol/L (ref 22–32)
Calcium: 8.6 mg/dL — ABNORMAL LOW (ref 8.9–10.3)
Creatinine, Ser: 0.66 mg/dL (ref 0.44–1.00)
GFR calc non Af Amer: >60 mL/min (ref >60)
Glucose, Bld: 110 mg/dL — ABNORMAL HIGH (ref 65–99)
Potassium: 3.4 mmol/L — ABNORMAL LOW (ref 3.5–5.1)
Sodium: 128 mmol/L — ABNORMAL LOW (ref 135–145)

## 2016-10-20 MED FILL — ASPIRIN ADULT LOW STRENGTH: 81 | 30 days supply | Qty: 60 | Fill #0

## 2016-10-20 MED FILL — HYDROCODON-APAP 7.5-325: 7.5-325 | 5 days supply | Qty: 60 | Fill #0

## 2016-10-20 MED FILL — METHOCARBAMOL 500 MG TABLET: 500 | 10 days supply | Qty: 40 | Fill #0

## 2016-10-20 NOTE — Evaluation (Signed)
Occupational Therapy Evaluation Patient Details Name: Julie Castillo MRN: [id number] DOB: [birth year] Today's Date: 10/20/2016    History of Present Illness 61 yo female s/p L TKA; Hx: R TKA   Clinical Impression   This 61 y/o F presents with the above. Pt lives with spouse, reports she was independent with ADLs and functional mobility at baseline. Pt currently requires MinGuard assist for functional mobility using RW, MinA for LB ADLs. Pt will have assist from spouse PRN after return home for ADL completion. Educated on safety during functional mobility transfers and during ADL completion. Questions answered throughout. Pt reports feeling comfortable completing ADLs after return home and with spouse assist PRN. No additional acute OT needs identified at this time. Will sign off.     Follow Up Recommendations  DC plan and follow up therapy as arranged by surgeon;Supervision/Assistance - 24 hour (initially )    Equipment Recommendations  None recommended by OT           Precautions / Restrictions Precautions Precautions: Fall;Knee Restrictions Weight Bearing Restrictions: No Other Position/Activity Restrictions: WBAT      Mobility Bed Mobility               General bed mobility comments: OOB with PT   Transfers Overall transfer level: Needs assistance Equipment used: Rolling walker (2 wheeled) Transfers: Sit to/from Stand Sit to Stand: Min guard              Balance Overall balance assessment: No apparent balance deficits (not formally assessed)                                         ADL either performed or assessed with clinical judgement   ADL Overall ADL's : Needs assistance/impaired Eating/Feeding: Set up;Sitting   Grooming: Wash/dry hands;Min guard;Standing   Upper Body Bathing: Min guard;Sitting   Lower Body Bathing: Minimal assistance;Sit to/from stand   Upper Body Dressing : Min guard;Sitting   Lower Body Dressing: Minimal  assistance;Sit to/from stand   Toilet Transfer: Minimal assistance;Ambulation;BSC;RW Toilet Transfer Details (indicate cue type and reason): BSC over toilet  Toileting- Clothing Manipulation and Hygiene: Minimal assistance;Sit to/from Nurse, children's Details (indicate cue type and reason): discussed safe transfer to tub using RW and 3:1 (using 3:1 as shower seat); also discussed completing sponge bath initially until Pt is safely able to transfer to tub  Functional mobility during ADLs: Min guard;Rolling walker                           Pertinent Vitals/Pain Pain Assessment: Faces Faces Pain Scale: Hurts a little bit Pain Location: L knee  Pain Descriptors / Indicators: Sore Pain Intervention(s): Monitored during session;Repositioned;Ice applied          Extremity/Trunk Assessment Upper Extremity Assessment Upper Extremity Assessment: Overall WFL for tasks assessed   Lower Extremity Assessment Lower Extremity Assessment: Defer to PT evaluation       Communication Communication Communication: No difficulties   Cognition Arousal/Alertness: Awake/alert Behavior During Therapy: WFL for tasks assessed/performed Overall Cognitive Status: Within Functional Limits for tasks assessed  Home Living Family/patient expects to be discharged to:: Private residence Living Arrangements: Spouse/significant other Available Help at Discharge: Family Type of Home: House Home Access: Stairs to enter Technical brewer of Steps: 4--garage Entrance Stairs-Rails: Right;Left;Can reach both Home Layout: One level     Bathroom Shower/Tub: Teacher, early years/pre: [place]: Clinical cytogeneticist - 2 wheels;Cane - single point;Bedside commode   Additional Comments: borrowing shower seat;      Prior Functioning/Environment Level of Independence: Independent         Comments: works at [employer]        OT Problem List: Decreased activity tolerance;Decreased knowledge of use of DME or AE;Impaired balance (sitting and/or standing)      OT Treatment/Interventions:      OT Goals(Current goals can be found in the care plan section) Acute Rehab OT Goals Patient Stated Goal: return home  OT Goal Formulation: With patient                                 AM-PAC PT "6 Clicks" Daily Activity     Outcome Measure Help from another person eating meals?: None Help from another person taking care of personal grooming?: A Little Help from another person toileting, which includes using toliet, bedpan, or urinal?: A Little Help from another person bathing (including washing, rinsing, drying)?: A Lot Help from another person to put on and taking off regular upper body clothing?: None Help from another person to put on and taking off regular lower body clothing?: A Little 6 Click Score: 19   End of Session Equipment Utilized During Treatment: Gait belt;Rolling walker Nurse Communication: Mobility status  Activity Tolerance: Patient tolerated treatment well Patient left: in chair;with call bell/phone within reach  OT Visit Diagnosis: Unsteadiness on feet (R26.81)                Time: 6468-0321 OT Time Calculation (min): 17 min Charges:  OT General Charges $OT Visit: 1 Visit OT Evaluation $OT Eval Low Complexity: 1 Low G-Codes: OT G-codes **NOT FOR INPATIENT CLASS** Functional Assessment Tool Used: AM-PAC 6 Clicks Daily Activity;Clinical judgement Functional Limitation: Self care Self Care Current Status (Y2482): At least 20 percent but less than 40 percent impaired, limited or restricted Self Care Goal Status (N0037): At least 20 percent but less than 40 percent impaired, limited or restricted Self Care Discharge Status [phone]): At least 20 percent but less than 40 percent impaired, limited or restricted   Lou Cal,  OT Pager [phone] 10/20/2016   [name] 10/20/2016, 9:49 AM

## 2016-10-20 NOTE — Progress Notes (Signed)
Patient ID: Julie Castillo, female   DOB: 09/13/1955, 61 y.o.   MRN: [id number] Subjective: 1 Day Post-Op Procedure(s) (LRB): LEFT TOTAL KNEE ARTHROPLASTY (Left)    Patient reports pain as mild.  Doing well, happy that foot function is intact this time around. Pleased with progress thus far   Objective:   VITALS:   Vitals:   10/20/16 0153 10/20/16 0601  BP: 128/70 122/71  Pulse: 81 78  Resp: 16 16  Temp: 98 F (36.7 C) 98 F (36.7 C)  SpO2: 100% 100%    Neurovascular intact Incision: dressing C/D/I  LABS  Recent Labs  10/20/16 0517  HGB 9.7*  HCT 27.8*  WBC 9.6  PLT 186     Recent Labs  10/20/16 0517  NA 128*  K 3.4*  BUN 15  CREATININE 0.66  GLUCOSE 110*    No results for input(s): LABPT, INR in the last 72 hours.   Assessment/Plan: 1 Day Post-Op Procedure(s) (LRB): LEFT TOTAL KNEE ARTHROPLASTY (Left)   Advance diet Up with therapy  Plan on discharge this pm Outpt PT already set up for Thursday RTC in 2 weeks

## 2016-10-20 NOTE — Progress Notes (Signed)
Physical Therapy Treatment Patient Details Name: Julie Castillo MRN: [id number] DOB: 1955/10/28 Today's Date: 10/20/2016    History of Present Illness 61 yo female s/p L TKA; Hx: R TKA    PT Comments     pt progressing well; will see in pm for stair training and education  Follow Up Recommendations  Outpatient PT;DC plan and follow up therapy as arranged by surgeon     Equipment Recommendations  None recommended by PT    Recommendations for Other Services       Precautions / Restrictions Precautions Precautions: Fall;Knee Restrictions Weight Bearing Restrictions: No Other Position/Activity Restrictions: WBAT    Mobility  Bed Mobility Overal bed mobility: Needs Assistance Bed Mobility: Supine to Sit     Supine to sit: Min guard     General bed mobility comments: min/guard for LLE, pt encouraged to move slower than her normal  Transfers Overall transfer level: Needs assistance Equipment used: Rolling walker (2 wheeled) Transfers: Sit to/from Stand Sit to Stand: Min guard;Supervision         General transfer comment: cues for hand placement and LLE position  Ambulation/Gait Ambulation/Gait assistance: Min guard Ambulation Distance (Feet): 65 Feet Assistive device: Rolling walker (2 wheeled) Gait Pattern/deviations: Step-to pattern;Antalgic     General Gait Details: cues for sequence and RW position   Stairs            Wheelchair Mobility    Modified Rankin (Stroke Patients Only)       Balance Overall balance assessment: No apparent balance deficits (not formally assessed)                                          Cognition Arousal/Alertness: Awake/alert Behavior During Therapy: WFL for tasks assessed/performed Overall Cognitive Status: Within Functional Limits for tasks assessed                                        Exercises Total Joint Exercises Quad Sets: AROM;Strengthening;10 reps;Both Heel  Slides: AAROM;Supine;10 reps;Left Hip ABduction/ADduction: AROM;Strengthening;Left;10 reps Straight Leg Raises: AAROM;Left;10 reps    General Comments        Pertinent Vitals/Pain Pain Assessment: 0-10 Pain Score: 4  Faces Pain Scale: Hurts a little bit Pain Location: L knee  Pain Descriptors / Indicators: Sore Pain Intervention(s): Limited activity within patient's tolerance;Monitored during session;Premedicated before session;Repositioned;Ice applied    Home Living Family/patient expects to be discharged to:: Private residence Living Arrangements: Spouse/significant other Available Help at Discharge: Family Type of Home: House Home Access: Stairs to enter Entrance Stairs-Rails: Right;Left;Can reach both Home Layout: One level Home Equipment: Clinical cytogeneticist - 2 wheels;Cane - single point;Bedside commode Additional Comments: borrowing shower seat;    Prior Function Level of Independence: Independent      Comments: works at [employer]   PT Goals (current goals can now be found in the care plan section) Acute Rehab PT Goals Patient Stated Goal: return home  PT Goal Formulation: With patient Time For Goal Achievement: 10/22/16 Potential to Achieve Goals: Good Progress towards PT goals: Progressing toward goals    Frequency    7X/week      PT Plan Current plan remains appropriate    Co-evaluation              AM-PAC PT "6  Clicks" Daily Activity  Outcome Measure  Difficulty turning over in bed (including adjusting bedclothes, sheets and blankets)?: Unable Difficulty moving from lying on back to sitting on the side of the bed? : Unable Difficulty sitting down on and standing up from a chair with arms (e.g., wheelchair, bedside commode, etc,.)?: Unable Help needed moving to and from a bed to chair (including a wheelchair)?: A Little Help needed walking in hospital room?: A Little Help needed climbing 3-5 steps with a railing? : A Little 6 Click  Score: 12    End of Session Equipment Utilized During Treatment: Gait belt Activity Tolerance: Patient tolerated treatment well Patient left: with call bell/phone within reach;in chair Nurse Communication: Mobility status PT Visit Diagnosis: Difficulty in walking, not elsewhere classified (R26.2)     Time: 3491-7915 PT Time Calculation (min) (ACUTE ONLY): 18 min  Charges:  $Gait Training: 8-22 mins                    G Codes:          Emett Stapel 2016-11-04, 12:53 PM

## 2016-10-20 NOTE — Anesthesia Postprocedure Evaluation (Signed)
Anesthesia Post Note  Patient: Julie Castillo  Procedure(s) Performed: Procedure(s) (LRB): LEFT TOTAL KNEE ARTHROPLASTY (Left)     Patient location during evaluation: PACU Anesthesia Type: Spinal Level of consciousness: oriented and awake and alert Pain management: pain level controlled Vital Signs Assessment: post-procedure vital signs reviewed and stable Respiratory status: spontaneous breathing, respiratory function stable and patient connected to nasal cannula oxygen Cardiovascular status: blood pressure returned to baseline and stable Postop Assessment: no headache and no backache Anesthetic complications: no    Last Vitals:  Vitals:   10/20/16 0938 10/20/16 1320  BP: 137/70 (!) 168/80  Pulse: 72 73  Resp: 16 14  Temp: 36.4 C 36.5 C  SpO2:  100%    Last Pain:  Vitals:   10/20/16 1320  TempSrc: Oral  PainSc:                  [name],JAMES TERRILL

## 2016-10-20 NOTE — Progress Notes (Signed)
Discharge planning, no HH needs identified. Plan for OP PT, has DME. [phone] 

## 2016-10-20 NOTE — Progress Notes (Signed)
10/20/16 1400  PT Visit Information  Last PT Received On 10/20/16  Assistance Needed +1  History of Present Illness 61 yo female s/p L TKA; Hx: R TKA  Subjective Data  Patient Stated Goal return home   Precautions  Precautions Fall;Knee  Restrictions  Other Position/Activity Restrictions WBAT  Pain Assessment  Pain Assessment 0-10  Pain Score 4  Pain Location L knee   Pain Descriptors / Indicators Sore  Pain Intervention(s) Limited activity within patient's tolerance;Monitored during session;Repositioned  Cognition  Arousal/Alertness Awake/alert  Behavior During Therapy WFL for tasks assessed/performed  Overall Cognitive Status Within Functional Limits for tasks assessed  Bed Mobility  Overal bed mobility Needs Assistance  Bed Mobility Sit to Supine  Sit to supine Supervision  General bed mobility comments for safety  Transfers  Overall transfer level Needs assistance  Equipment used Rolling walker (2 wheeled)  Transfers Sit to/from Stand  Sit to Stand Supervision  General transfer comment cues for hand placement and LLE position  Ambulation/Gait  Ambulation/Gait assistance Supervision  Ambulation Distance (Feet) 45 Feet  Assistive device Rolling walker (2 wheeled)  Gait Pattern/deviations Step-to pattern;Antalgic;Step-through pattern  General Gait Details cues for sequence, RW position-rolling, cues for gait progression/step through  Stairs Yes  Stairs assistance Min guard  Stair Management Two rails;One rail Right;One rail Left;Step to pattern;Sideways;Forwards  Number of Stairs 5  General stair comments cues for technique and safety  Total Joint Exercises  Ankle Circles/Pumps AROM;Both;10 reps  Quad Sets AROM;Strengthening;10 reps;Both  Heel Slides AAROM;Supine;10 reps;Left  Straight Leg Raises AAROM;Left;10 reps  Short Arc [place];Left;10 reps;Supine  Long Arc Quad AROM;Left;10 reps;Seated  Knee Flexion AROM;Left;10 reps;Seated  Goniometric ROM ~10-85*  PT  - End of Session  Equipment Utilized During Treatment Gait belt  Activity Tolerance Patient tolerated treatment well  Patient left in bed;with call bell/phone within reach  PT - Assessment/Plan  PT Plan Current plan remains appropriate  PT Visit Diagnosis Difficulty in walking, not elsewhere classified (R26.2)  PT Frequency (ACUTE ONLY) 7X/week  Follow Up Recommendations Outpatient PT;DC plan and follow up therapy as arranged by surgeon  PT equipment None recommended by PT  AM-PAC PT "6 Clicks" Daily Activity Outcome Measure  Difficulty turning over in bed (including adjusting bedclothes, sheets and blankets)? 3  Difficulty moving from lying on back to sitting on the side of the bed?  3  Difficulty sitting down on and standing up from a chair with arms (e.g., wheelchair, bedside commode, etc,.)? 3  Help needed moving to and from a bed to chair (including a wheelchair)? 3  Help needed walking in hospital room? 3  6 Click Score 15  Mobility G Code  CK  PT Goal Progression  Progress towards PT goals Progressing toward goals  Acute Rehab PT Goals  PT Goal Formulation With patient  Time For Goal Achievement 10/22/16  Potential to Achieve Goals Good  PT Time Calculation  PT Start Time (ACUTE ONLY) 1330  PT Stop Time (ACUTE ONLY) 1348  PT Time Calculation (min) (ACUTE ONLY) 18 min  PT General Charges  $$ ACUTE PT VISIT 1 Visit  PT Treatments  $Gait Training 8-22 mins

## 2016-10-21 NOTE — Discharge Summary (Signed)
Physician Discharge Summary  Patient ID: Julie Castillo MRN: [id number] DOB/AGE: 62-Apr-1957 62 y.o.  Admit date: 10/19/2016 Discharge date: 10/20/2016   Procedures:  Procedure(s) (LRB): LEFT TOTAL KNEE ARTHROPLASTY (Left)  Attending Physician:  Dr. Paralee Cancel   Admission Diagnoses:   Left knee primary OA /  pain  Discharge Diagnoses:  Principal Problem:   S/P left TKA  Past Medical History:  Diagnosis Date  . Arthritis   . Edema    lower extremities after standing a long time   . Family history of adverse reaction to anesthesia    father- post op became violent   . Foot drop, right 2016   developed foot drop following RTKA in 2016; had to wear foot drop shoe following discharge ; per pat "i had pain in my ankle real bad but it ended up being dx as foot drop"  . GERD (gastroesophageal reflux disease)   . Hypertension   . Hypothyroidism   . Pneumonia    hx of   . PONV (postoperative nausea and vomiting)    post op day 1   . Varicose veins     HPI:     Julie Castillo, 61 y.o. female, has a history of pain and functional disability in the left knee due to arthritis and has failed non-surgical conservative treatments for greater than 12 weeks to include NSAID's and/or analgesics, corticosteriod injections, viscosupplementation injections, use of assistive devices and activity modification.  Onset of symptoms was gradual, starting 5-7 years ago with gradually worsening course since that time. The patient noted no past surgery on the left knee(s).  Patient currently rates pain in the left knee(s) at 10 out of 10 with activity. Patient has night pain, worsening of pain with activity and weight bearing, pain that interferes with activities of daily living, pain with passive range of motion, crepitus and joint swelling.  Patient has evidence of periarticular osteophytes and joint space narrowing by imaging studies.  There is no active infection.  Risks, benefits and expectations were  discussed with the patient.  Risks including but not limited to the risk of anesthesia, drop foot, blood clots, nerve damage, blood vessel damage, failure of the prosthesis, infection and up to and including death.  Patient understand the risks, benefits and expectations and wishes to proceed with surgery.   PCP: Glean Hess, MD   Discharged Condition: good  Hospital Course:  Patient underwent the above stated procedure on 10/19/2016. Patient tolerated the procedure well and brought to the recovery room in good condition and subsequently to the floor.  POD #1 BP: 122/71 ; Pulse: 78 ; Temp: 98 F (36.7 C) ; Resp: 16 Patient reports pain as mild.  Doing well, happy that foot function is intact this time around.  Pleased with progress thus far. Neurovascular intact and incision: dressing C/D/I.  LABS  Basename    HGB     9.7  HCT     27.8    Discharge Exam: General appearance: alert, cooperative and no distress Extremities: Homans sign is negative, no sign of DVT, no edema, redness or tenderness in the calves or thighs and no ulcers, gangrene or trophic changes  Disposition: Home with follow up in 2 weeks   Follow-up Information    Paralee Cancel, MD. Schedule an appointment as soon as possible for a visit in 2 week(s).   Specialty:  Orthopedic Surgery Contact information: [address] [place] [address] [phone]  Discharge Instructions    Call MD / Call 911    Complete by:  As directed    If you experience chest pain or shortness of breath, CALL 911 and be transported to the hospital emergency room.  If you develope a fever above 101 F, pus (white drainage) or increased drainage or redness at the wound, or calf pain, call your surgeon's office.   Change dressing    Complete by:  As directed    Maintain surgical dressing until follow up in the clinic. If the edges start to pull up, may reinforce with tape. If the dressing is no longer  working, may remove and cover with gauze and tape, but must keep the area dry and clean.  Call with any questions or concerns.   Constipation Prevention    Complete by:  As directed    Drink plenty of fluids.  Prune juice may be helpful.  You may use a stool softener, such as Colace (over the counter) 100 mg twice a day.  Use MiraLax (over the counter) for constipation as needed.   Diet - low sodium heart healthy    Complete by:  As directed    Discharge instructions    Complete by:  As directed    Maintain surgical dressing until follow up in the clinic. If the edges start to pull up, may reinforce with tape. If the dressing is no longer working, may remove and cover with gauze and tape, but must keep the area dry and clean.  Follow up in 2 weeks at [facility]. Call with any questions or concerns.   Increase activity slowly as tolerated    Complete by:  As directed    Weight bearing as tolerated with assist device (walker, cane, etc) as directed, use it as long as suggested by your surgeon or therapist, typically at least 4-6 weeks.   TED hose    Complete by:  As directed    Use stockings (TED hose) for 2 weeks on both leg(s).  You may remove them at night for sleeping.      Allergies as of 10/20/2016      Reactions   Adhesive [tape] Other (See Comments)   Sensitive skin- can use paper tape bandaids- make skin red    Sulfa Antibiotics Hives      Medication List    STOP taking these medications   acetaminophen 325 MG tablet Commonly known as:  TYLENOL   acetaminophen 650 MG CR tablet Commonly known as:  TYLENOL     TAKE these medications   aspirin 81 MG chewable tablet Commonly known as:  ASPIRIN CHILDRENS Chew 1 tablet (81 mg total) by mouth 2 (two) times daily. Take for 4 weeks, then resume regular dose.   docusate sodium 100 MG capsule Commonly known as:  COLACE Take 1 capsule (100 mg total) by mouth 2 (two) times daily.   enalapril 20 MG tablet Commonly  known as:  VASOTEC Take 1 tablet (20 mg total) by mouth 2 (two) times daily.   ferrous sulfate 325 (65 FE) MG tablet Commonly known as:  FERROUSUL Take 1 tablet (325 mg total) by mouth 3 (three) times daily with meals.   fluticasone 50 MCG/ACT nasal spray Commonly known as:  FLONASE Place 2 sprays into both nostrils daily.   hydrochlorothiazide 25 MG tablet Commonly known as:  HYDRODIURIL Take 25 mg by mouth daily.   HYDROcodone-acetaminophen 7.5-325 MG tablet Commonly known as:  NORCO Take 1-2 tablets by  mouth every 4 (four) hours as needed for moderate pain or severe pain.   levothyroxine 25 MCG tablet Commonly known as:  SYNTHROID, LEVOTHROID Take 1 tablet (25 mcg total) by mouth daily before breakfast.   methocarbamol 500 MG tablet Commonly known as:  ROBAXIN Take 1 tablet (500 mg total) by mouth every 6 (six) hours as needed for muscle spasms.   omeprazole 20 MG capsule Commonly known as:  PRILOSEC Take 20 mg by mouth 2 (two) times daily.   polyethylene glycol packet Commonly known as:  MIRALAX / GLYCOLAX Take 17 g by mouth 2 (two) times daily.   VITAMIN B 12 PO Take 1 tablet by mouth daily.   VITAMIN B6 PO Take 1 tablet by mouth daily.   Vitamin D 2000 units Caps Take 2,000 Units by mouth daily.            Discharge Care Instructions        Start     Ordered   10/20/16 0000  Call MD / Call 911    Comments:  If you experience chest pain or shortness of breath, CALL 911 and be transported to the hospital emergency room.  If you develope a fever above 101 F, pus (white drainage) or increased drainage or redness at the wound, or calf pain, call your surgeon's office.   10/20/16 6045   10/20/16 0000  Discharge instructions    Comments:  Maintain surgical dressing until follow up in the clinic. If the edges start to pull up, may reinforce with tape. If the dressing is no longer working, may remove and cover with gauze and tape, but must keep the area dry and  clean.  Follow up in 2 weeks at [facility]. Call with any questions or concerns.   10/20/16 0923   10/20/16 0000  Diet - low sodium heart healthy     10/20/16 0923   10/20/16 0000  Constipation Prevention    Comments:  Drink plenty of fluids.  Prune juice may be helpful.  You may use a stool softener, such as Colace (over the counter) 100 mg twice a day.  Use MiraLax (over the counter) for constipation as needed.   10/20/16 0923   10/20/16 0000  Increase activity slowly as tolerated    Comments:  Weight bearing as tolerated with assist device (walker, cane, etc) as directed, use it as long as suggested by your surgeon or therapist, typically at least 4-6 weeks.   10/20/16 0923   10/20/16 0000  TED hose    Comments:  Use stockings (TED hose) for 2 weeks on both leg(s).  You may remove them at night for sleeping.   10/20/16 0923   10/20/16 0000  Change dressing    Comments:  Maintain surgical dressing until follow up in the clinic. If the edges start to pull up, may reinforce with tape. If the dressing is no longer working, may remove and cover with gauze and tape, but must keep the area dry and clean.  Call with any questions or concerns.   10/20/16 0923   10/19/16 0000  aspirin (ASPIRIN CHILDRENS) 81 MG chewable tablet  2 times daily    Question:  Supervising Provider  Answer:  Paralee Cancel   10/19/16 0917   10/19/16 0000  docusate sodium (COLACE) 100 MG capsule  2 times daily    Question:  Supervising Provider  Answer:  Paralee Cancel   10/19/16 0917   10/19/16 0000  ferrous sulfate (FERROUSUL) 325 (65  FE) MG tablet  3 times daily with meals    Question:  Supervising Provider  Answer:  Paralee Cancel   10/19/16 0917   10/19/16 0000  polyethylene glycol (MIRALAX / Floria Raveling) packet  2 times daily    Question:  Supervising Provider  Answer:  Paralee Cancel   10/19/16 0917   10/19/16 0000  methocarbamol (ROBAXIN) 500 MG tablet  Every 6 hours PRN    Question:  Supervising Provider   Answer:  Paralee Cancel   10/19/16 0917   10/19/16 0000  HYDROcodone-acetaminophen (NORCO) 7.5-325 MG tablet  Every 4 hours PRN    Question:  Supervising Provider  Answer:  Paralee Cancel   10/19/16 2878       Signed: [place]. Chyanne Kohut   PA-C  10/21/2016, 8:40 AM

## 2016-10-22 ENCOUNTER — Ambulatory Visit: Payer: 59 | Attending: Orthopedic Surgery | Admitting: Physical Therapy

## 2016-10-22 ENCOUNTER — Encounter: Payer: Self-pay | Admitting: Physical Therapy

## 2016-10-22 DIAGNOSIS — M25562 Pain in left knee: Secondary | ICD-10-CM | POA: Insufficient documentation

## 2016-10-22 DIAGNOSIS — M25662 Stiffness of left knee, not elsewhere classified: Secondary | ICD-10-CM | POA: Insufficient documentation

## 2016-10-22 DIAGNOSIS — Z96652 Presence of left artificial knee joint: Secondary | ICD-10-CM | POA: Diagnosis not present

## 2016-10-22 DIAGNOSIS — M6281 Muscle weakness (generalized): Secondary | ICD-10-CM | POA: Diagnosis not present

## 2016-10-22 DIAGNOSIS — R269 Unspecified abnormalities of gait and mobility: Secondary | ICD-10-CM | POA: Insufficient documentation

## 2016-10-22 NOTE — Therapy (Deleted)
[facility] [address] [place], [place], [address] Phone: [phone]   Fax:  [phone]  Physical Therapy Evaluation  Patient Details  Name: Julie Castillo MRN: [id number] Date of Birth: 04-Jan-1956 Referring Provider: Dr. Alvan Dame  Encounter Date: 10/22/2016         PT End of Session - 10/22/16 1335    Visit Number 1   Number of Visits 8   Date for PT Re-Evaluation 11/19/16   PT Start Time 1236   PT Stop Time 1335   PT Time Calculation (min) 59 min   Equipment Utilized During Treatment Gait belt   Activity Tolerance Patient tolerated treatment well   Behavior During Therapy [facility] for tasks assessed/performed      Past Medical History:  Diagnosis Date  . Arthritis   . Edema    lower extremities after standing a long time   . Family history of adverse reaction to anesthesia    father- post op became violent   . Foot drop, right 2016   developed foot drop following RTKA in 2016; had to wear foot drop shoe following discharge ; per pat "i had pain in my ankle real bad but it ended up being dx as foot drop"  . GERD (gastroesophageal reflux disease)   . Hypertension   . Hypothyroidism   . Pneumonia    hx of   . PONV (postoperative nausea and vomiting)    post op day 1   . Varicose veins     Past Surgical History:  Procedure Laterality Date  . BREAST CYST ASPIRATION Left 1988   "it just a fineneedle aspiration"   . EYE SURGERY  2001,2005   left eye surgery x 2 ( eyelid)   . 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 TOTAL KNEE ARTHROPLASTY Left 10/19/2016   Procedure: LEFT TOTAL KNEE ARTHROPLASTY;  Surgeon: Paralee Cancel, MD;  Location: [facility];  Service: Orthopedics;  Laterality: Left;    There were no vitals filed for this visit.       Subjective Assessment - 10/22/16 1238     Subjective Pt. states MD has pt scheduled to be out of work 12 weeks but she is hoping to be back in 8 weeks.  Pt. s/p L TKA on 10/19/16.     Pertinent History R TKA 2 years ago (doing well).     Limitations Lifting;Standing;Walking;House hold activities   Patient Stated Goals Increase L knee ROM/ strength to improve pain-free mobility and return to work.     Currently in Pain? Yes   Pain Score 5    Pain Location Knee   Pain Orientation Left   Pain Descriptors / Indicators Aching   Pain Type Surgical pain        Pt. is a pleasant 61 y/o female s/p L TKA on 10/19/16.  Pt. has had a R TKA 2 years ago with good results.  Pt. reports 5/10 L knee pain currently at rest and presents with significant ecchymosis around knee/ quad. with bandages/ ace wrap in place.  No circumferential measurements taken at this time due to bandaging.  L knee AROM in supine: -10 to 95 deg. (after stretches).  Good patellar mobility (all planes).  R LE muscle strength grossly 4+/5 MMT except hip flexion 4/5 MMT.  L LE muscle strength grossly 4/5 MMT (  no increase c/o pain).  Pt. ambulates with use of RW in PT clinic (adjusted to proper height) and ascending/ descending stars iwth step to pattern.  Pt. will benefit from skilled PT services to increase L knee ROM/ strength to promote return to work without restrictions.    See HEP      PT Education - 10/22/16 1334    Education provided Yes   Education Details See HEP   Person(s) Educated Patient   Methods Explanation;Handout;Demonstration   Comprehension Verbalized understanding;Returned demonstration          Patient will benefit from skilled therapeutic intervention in order to improve the following deficits and impairments:  Abnormal gait, Pain, Decreased scar mobility, Decreased mobility, Decreased activity tolerance, Decreased endurance, Decreased range of motion, Decreased strength, Hypomobility, Impaired flexibility, Increased edema, Difficulty walking,  Decreased balance  Visit Diagnosis: Joint stiffness of knee, left  Muscle weakness  Gait difficulty  Acute pain of left knee  S/P TKR (total knee replacement), left     Problem List Patient Active Problem List   Diagnosis Date Noted  . 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 Hyponatremia 10/02/2016  . Mixed hyperlipidemia 10/02/2016  . Lower extremity edema 03/27/2016  . Osteoarthritis 03/27/2016  . Vitamin D deficiency 03/27/2016  . Seasonal allergies 03/27/2016  . Ptosis of eyelid, left 03/27/2016  . Essential hypertension 03/22/2015  . Gastroesophageal reflux disease without esophagitis 03/22/2015  . Hypothyroidism due to acquired atrophy of thyroid 03/22/2015  . S/P right TKA 03/20/2014  . S/P knee replacement 03/20/2014    Pura Spice 10/26/2016, 6:23 PM  [facility] [address] [place], [place], [address] Phone: [phone]   Fax:  [phone]  Name: Julie Castillo MRN: [id number] Date of Birth: 1955-05-11

## 2016-10-26 NOTE — Addendum Note (Signed)
Addended by: Pura Spice on: 10/26/2016 06:28 PM   Modules accepted: Orders

## 2016-10-26 NOTE — Therapy (Signed)
[facility] [address] [place], [place], [address] Phone: [phone]   Fax:  [phone]  Physical Therapy Evaluation  Patient Details  Name: Julie Castillo MRN: [id number] Date of Birth: [birth year] Referring Provider: Dr. Alvan Dame  Encounter Date: 10/22/2016    PT End of Session - 10/22/16 1335    Visit Number 1   Number of Visits 8   Date for PT Re-Evaluation 11/19/16   PT Start Time 1236   PT Stop Time 1335   PT Time Calculation (min) 59 min   Equipment Utilized During Treatment Gait belt   Activity Tolerance Patient tolerated treatment well   Behavior During Therapy [facility] for tasks assessed/performed       Past Medical History:  Diagnosis Date  . Arthritis   . Edema    lower extremities after standing a long time   . Family history of adverse reaction to anesthesia    father- post op became violent   . Foot drop, right 2016   developed foot drop following RTKA in 2016; had to wear foot drop shoe following discharge ; per pat "i had pain in my ankle real bad but it ended up being dx as foot drop"  . GERD (gastroesophageal reflux disease)   . Hypertension   . Hypothyroidism   . Pneumonia    hx of   . PONV (postoperative nausea and vomiting)    post op day 1   . Varicose veins     Past Surgical History:  Procedure Laterality Date  . BREAST CYST ASPIRATION Left 1988   "it just a fineneedle aspiration"   . EYE SURGERY  2001,2005   left eye surgery x 2 ( eyelid)   . 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 TOTAL KNEE ARTHROPLASTY Left 10/19/2016   Procedure: LEFT TOTAL KNEE ARTHROPLASTY;  Surgeon: Paralee Cancel, MD;  Location: [facility];  Service: Orthopedics;  Laterality: Left;    There were no vitals filed for this visit.     Pt. is a pleasant 61 y/o female s/p L TKA on 10/19/16.   Pt. has had a R TKA 2 years ago with good results.  Pt. reports 5/10 L knee pain currently at rest and presents with significant ecchymosis around knee/ quad. with bandages/ ace wrap in place.  No circumferential measurements taken at this time due to bandaging.  L knee AROM in supine: -10 to 95 deg. (after stretches).  Good patellar mobility (all planes).  R LE muscle strength grossly 4+/5 MMT except hip flexion 4/5 MMT.  L LE muscle strength grossly 4/5 MMT (no increase c/o pain).  Pt. ambulates with use of RW in PT clinic (adjusted to proper height) and ascending/ descending stars iwth step to pattern.  Pt. will benefit from skilled PT services to increase L knee ROM/ strength to promote return to work without restrictions.        See HEP.  Supine L knee AAROM 5x on mat table with static holds.  Discussed HEP in depth.         PT Long Term Goals - 10/23/16 1813      PT LONG TERM GOAL #1   Title Pt. will increase LEFS to >40 out of 80 to promote return to work.     Baseline LEFS: 23 out of 80   Time  4   Period Weeks   Status New   Target Date 12/17/16     PT LONG TERM GOAL #2   Title Pt. will increase L knee AROM (0 to >115 deg.) to improve ability to get in/out of car and off commode independently.     Baseline L knee AROM: -10 to 95 deg.    Time 4   Period Weeks   Status New   Target Date 12/17/16     PT LONG TERM GOAL #3   Title Pt. will increase B LE muscle strength to grossly 4+/5 MMT to improve standing tolerance at work/ functional mobility.     Baseline L LE muscle strength grossly 4/5 MMT and R hip flexion 4/5 MMT   Time 4   Period Weeks   Status New   Target Date 12/17/16     PT LONG TERM GOAL #4   Title Pt. will ambulate with proper use of SPC and 2-point gait pattern to improve mod. independence with gait.     Baseline pt. ambulates with L antalgic gait and use of RW   Time 4   Period Weeks   Status New   Target Date 12/17/16     PT LONG TERM GOAL #5   Title  Pt. able to ascend/descend 8 step with recip. pattern and use of 1 handrail safely to improve mobility.     Baseline step to gait pattern with stairs and B UE assist.    Time 4   Period Weeks   Status New   Target Date 12/17/16              Patient will benefit from skilled therapeutic intervention in order to improve the following deficits and impairments:  Abnormal gait, Pain, Decreased scar mobility, Decreased mobility, Decreased activity tolerance, Decreased endurance, Decreased range of motion, Decreased strength, Hypomobility, Impaired flexibility, Increased edema, Difficulty walking, Decreased balance  Visit Diagnosis: Joint stiffness of knee, left  Muscle weakness  Gait difficulty  Acute pain of left knee  S/P TKR (total knee replacement), left     Problem List Patient Active Problem List   Diagnosis Date Noted  . 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 Hyponatremia 10/02/2016  . Mixed hyperlipidemia 10/02/2016  . Lower extremity edema 03/27/2016  . Osteoarthritis 03/27/2016  . Vitamin D deficiency 03/27/2016  . Seasonal allergies 03/27/2016  . Ptosis of eyelid, left 03/27/2016  . Essential hypertension 03/22/2015  . Gastroesophageal reflux disease without esophagitis 03/22/2015  . Hypothyroidism due to acquired atrophy of thyroid 03/22/2015  . S/P right TKA 03/20/2014  . S/P knee replacement 03/20/2014   Pura Spice, PT, DPT # [id number] 10/26/2016, 6:23 PM  [facility] [address] [place], [place], [address] Phone: [phone]   Fax:  [phone]  Name: Julie Castillo MRN: [id number] Date of Birth: 07/19/1955

## 2016-10-27 ENCOUNTER — Encounter: Payer: Self-pay | Admitting: Physical Therapy

## 2016-10-27 ENCOUNTER — Ambulatory Visit: Payer: 59 | Attending: Orthopedic Surgery | Admitting: Physical Therapy

## 2016-10-27 DIAGNOSIS — R269 Unspecified abnormalities of gait and mobility: Secondary | ICD-10-CM | POA: Diagnosis not present

## 2016-10-27 DIAGNOSIS — Z96652 Presence of left artificial knee joint: Secondary | ICD-10-CM | POA: Diagnosis not present

## 2016-10-27 DIAGNOSIS — M25662 Stiffness of left knee, not elsewhere classified: Secondary | ICD-10-CM | POA: Diagnosis not present

## 2016-10-27 DIAGNOSIS — M25562 Pain in left knee: Secondary | ICD-10-CM | POA: Diagnosis not present

## 2016-10-27 DIAGNOSIS — M6281 Muscle weakness (generalized): Secondary | ICD-10-CM | POA: Insufficient documentation

## 2016-10-27 NOTE — Therapy (Signed)
[facility] [address] [place], [place], [address] Phone: [phone]   Fax:  [phone]  Physical Therapy Treatment  Patient Details  Name: Julie Castillo MRN: [id number] Date of Birth: 28-Nov-1955 Referring Provider: Dr. Alvan Dame  Encounter Date: 10/27/2016      PT End of Session - 10/27/16 1510    Visit Number 2   Number of Visits 8   Date for PT Re-Evaluation 11/19/16   PT Start Time 1512   PT Stop Time 1556   PT Time Calculation (min) 44 min   Equipment Utilized During Treatment Gait belt   Activity Tolerance Patient tolerated treatment well   Behavior During Therapy [facility] for tasks assessed/performed      Past Medical History:  Diagnosis Date  . Arthritis   . Edema    lower extremities after standing a long time   . Family history of adverse reaction to anesthesia    father- post op became violent   . Foot drop, right 2016   developed foot drop following RTKA in 2016; had to wear foot drop shoe following discharge ; per pat "i had pain in my ankle real bad but it ended up being dx as foot drop"  . GERD (gastroesophageal reflux disease)   . Hypertension   . Hypothyroidism   . Pneumonia    hx of   . PONV (postoperative nausea and vomiting)    post op day 1   . Varicose veins     Past Surgical History:  Procedure Laterality Date  . BREAST CYST ASPIRATION Left 1988   "it just a fineneedle aspiration"   . EYE SURGERY  2001,2005   left eye surgery x 2 ( eyelid)   . 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 TOTAL KNEE ARTHROPLASTY Left 10/19/2016   Procedure: LEFT TOTAL KNEE ARTHROPLASTY;  Surgeon: Paralee Cancel, MD;  Location: [facility];  Service: Orthopedics;  Laterality: Left;    There were no vitals filed for this visit.      Subjective Assessment - 10/27/16 1517    Subjective Pt  reports her swelling has made her exercises difficult.  Pt reports she has been using ice and keeping her LLE elevated.  She has ordered an elastogel ice pack and is expecting it to be delivered tomorrow sometime.     Pertinent History R TKA 2 years ago (doing well).     Limitations Lifting;Standing;Walking;House hold activities   Patient Stated Goals Increase L knee ROM/ strength to improve pain-free mobility and return to work.     Currently in Pain? Yes   Pain Score 5    Pain Location Knee   Pain Orientation Left   Pain Descriptors / Indicators Aching   Pain Type Surgical pain   Multiple Pain Sites No      TREATMENT  L knee AROM at end of session (in deg):  F (seated): 99  E (supine): -8  ?   Therapeutic Exercise:   Seated L knee flexion AAROM with towel with 10 second holds x10   Seated L LAQ with 10 second holds x10   Standing L knee flexion AROM x10 holding onto // bars for support   Standing L knee TKE x15 holding onto // bars for support   Mini squats x15 holding onto // bars for support   Marching  in standing x20 each LE, pt reports fatigue with this   Ambulating in gym x4 minutes with use of SPC with cues for proper technique with heel strike and step through gait pattern. Encouraged pt to begin ambulating with SPC in home with husband providing stand by supervision.  Prone hang for 2 minutes which pt tolerated well   LLE SLR x15          PT Education - 10/27/16 1510    Education provided Yes   Education Details Exercise technique   Person(s) Educated Patient   Methods Explanation;Demonstration   Comprehension Verbalized understanding;Returned demonstration;Need further instruction             PT Long Term Goals - 10/23/16 1813      PT LONG TERM GOAL #1   Title Pt. will increase LEFS to >40 out of 80 to promote return to work.     Baseline LEFS: 23 out of 80   Time 4   Period Weeks   Status New   Target Date 12/17/16     PT LONG TERM GOAL  #2   Title Pt. will increase L knee AROM (0 to >115 deg.) to improve ability to get in/out of car and off commode independently.     Baseline L knee AROM: -10 to 95 deg.    Time 4   Period Weeks   Status New   Target Date 12/17/16     PT LONG TERM GOAL #3   Title Pt. will increase B LE muscle strength to grossly 4+/5 MMT to improve standing tolerance at work/ functional mobility.     Baseline L LE muscle strength grossly 4/5 MMT and R hip flexion 4/5 MMT   Time 4   Period Weeks   Status New   Target Date 12/17/16     PT LONG TERM GOAL #4   Title Pt. will ambulate with proper use of SPC and 2-point gait pattern to improve mod. independence with gait.     Baseline pt. ambulates with L antalgic gait and use of RW   Time 4   Period Weeks   Status New   Target Date 12/17/16     PT LONG TERM GOAL #5   Title Pt. able to ascend/descend 8 step with recip. pattern and use of 1 handrail safely to improve mobility.     Baseline step to gait pattern with stairs and B UE assist.    Time 4   Period Weeks   Status New   Target Date 12/17/16               Plan - 10/27/16 1529    Clinical Impression Statement Pt demonstrates improved L knee flexion and extension AROM following interventions today.  Encouraged pt to continue with use of ice in addition to elevating LLE at home for improved swelling.  Initiated gait training with SPC today and encouraged pt to begin using SPC in her home with supervisoin from her husband.  She tolerated all interventions well and will continue to benefit from skilled PT interventions for improved strength, ROM, and decreased pain.   Rehab Potential Good   PT Frequency 2x / week   PT Duration 4 weeks   PT Treatment/Interventions ADLs/Self Care Home Management;Cryotherapy;Balance training;Therapeutic exercise;Therapeutic activities;Functional mobility training;Stair training;Gait training;Neuromuscular re-education;Patient/family education;Passive range of  motion;Scar mobilization;Manual techniques   PT Next Visit Plan Increase L knee ROM/ check incision healing.  Progress to [facility] when appropriate.    PT Home  Exercise Plan see HEP   Consulted and Agree with Plan of Care Patient      Patient will benefit from skilled therapeutic intervention in order to improve the following deficits and impairments:  Abnormal gait, Pain, Decreased scar mobility, Decreased mobility, Decreased activity tolerance, Decreased endurance, Decreased range of motion, Decreased strength, Hypomobility, Impaired flexibility, Increased edema, Difficulty walking, Decreased balance  Visit Diagnosis: Joint stiffness of knee, left  Muscle weakness  Gait difficulty  Acute pain of left knee  S/P TKR (total knee replacement), left     Problem List Patient Active Problem List   Diagnosis Date Noted  . 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 Hyponatremia 10/02/2016  . Mixed hyperlipidemia 10/02/2016  . Lower extremity edema 03/27/2016  . Osteoarthritis 03/27/2016  . Vitamin D deficiency 03/27/2016  . Seasonal allergies 03/27/2016  . Ptosis of eyelid, left 03/27/2016  . Essential hypertension 03/22/2015  . Gastroesophageal reflux disease without esophagitis 03/22/2015  . Hypothyroidism due to acquired atrophy of thyroid 03/22/2015  . S/P right TKA 03/20/2014  . S/P knee replacement 03/20/2014    Collie Siad PT, DPT 10/27/2016, 3:56 PM  [facility] [address] [place], [place], [address] Phone: [phone]   Fax:  [phone]  Name: BETTYJANE SHENOY MRN: [id number] Date of Birth: August 10, 1955

## 2016-10-29 ENCOUNTER — Ambulatory Visit: Payer: 59 | Admitting: Physical Therapy

## 2016-10-29 DIAGNOSIS — M25562 Pain in left knee: Secondary | ICD-10-CM | POA: Diagnosis not present

## 2016-10-29 DIAGNOSIS — R269 Unspecified abnormalities of gait and mobility: Secondary | ICD-10-CM

## 2016-10-29 DIAGNOSIS — M6281 Muscle weakness (generalized): Secondary | ICD-10-CM

## 2016-10-29 DIAGNOSIS — M25662 Stiffness of left knee, not elsewhere classified: Secondary | ICD-10-CM | POA: Diagnosis not present

## 2016-10-29 DIAGNOSIS — Z96652 Presence of left artificial knee joint: Secondary | ICD-10-CM | POA: Diagnosis not present

## 2016-10-29 NOTE — Therapy (Signed)
[facility] [address] [place], [place], [address] Phone: [phone]   Fax:  [phone]  Physical Therapy Treatment  Patient Details  Name: Julie Castillo MRN: [id number] Date of Birth: [birth year] Referring Provider: Dr. Alvan Dame  Encounter Date: 10/29/2016      PT End of Session - 10/29/16 1453    Visit Number 3   Number of Visits 8   Date for PT Re-Evaluation 11/19/16   PT Start Time 1450   PT Stop Time 1547   PT Time Calculation (min) 57 min   Equipment Utilized During Treatment Gait belt   Activity Tolerance Patient tolerated treatment well   Behavior During Therapy [facility] for tasks assessed/performed      Past Medical History:  Diagnosis Date  . Arthritis   . Edema    lower extremities after standing a long time   . Family history of adverse reaction to anesthesia    father- post op became violent   . Foot drop, right 2016   developed foot drop following RTKA in 2016; had to wear foot drop shoe following discharge ; per pat "i had pain in my ankle real bad but it ended up being dx as foot drop"  . GERD (gastroesophageal reflux disease)   . Hypertension   . Hypothyroidism   . Pneumonia    hx of   . PONV (postoperative nausea and vomiting)    post op day 1   . Varicose veins     Past Surgical History:  Procedure Laterality Date  . BREAST CYST ASPIRATION Left 1988   "it just a fineneedle aspiration"   . EYE SURGERY  2001,2005   left eye surgery x 2 ( eyelid)   . 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 TOTAL KNEE ARTHROPLASTY Left 10/19/2016   Procedure: LEFT TOTAL KNEE ARTHROPLASTY;  Surgeon: Paralee Cancel, MD;  Location: [facility];  Service: Orthopedics;  Laterality: Left;    There were no vitals filed for this visit.      Subjective Assessment - 10/29/16 1452    Subjective  Pt. reports 5/10 R knee pain and states "it is not bad".  Pt. took a pain pill prior to PT for 1st time today.  Pt. sleeping better at night.     Pertinent History R TKA 2 years ago (doing well).     Limitations Lifting;Standing;Walking;House hold activities   Patient Stated Goals Increase L knee ROM/ strength to improve pain-free mobility and return to work.     Currently in Pain? Yes   Pain Score 5    Pain Location Knee   Pain Orientation Left   Pain Descriptors / Indicators Aching   Pain Type Surgical pain       MD f/u next Wednesday 9/12   TREATMENT  L knee AROM at end of session (in deg):  F (seated): 104 E (supine): -8  ?   Therapeutic Exercise:   Nustep L4 10 min. B UE/LE seat #9-8 (no charge/ warm-up).    Seated L LAQ/ hip flexion 20x each.   Standing L knee flexion/ abduction AROM x 20 holding onto // bars for support   Mini squats x15 holding onto // bars for support   Marching in standing/ heel raises x20 each LE (cuing for posture)- fatigue noted.  Sidestepping L/R in //-bars (light UE  assist).  Tandem stance/ walking in //-bars   Manual tx.:   Supine L knee AA/PROM flexion and extension 6x.  Patellar mobs. (all planes)- moderate effusion in L knee under bandage   Gait training:  Ambulate with use of SPC and 2-point gait pattern with CGA for proper technique.  Cuing to increase L hip/knee flexion and heel strike  Step ups on L with use of UE and no increase c/o pain.     Ice to L knee in supine (elevated position) after tx.            PT Long Term Goals - 10/23/16 1813      PT LONG TERM GOAL #1   Title Pt. will increase LEFS to >40 out of 80 to promote return to work.     Baseline LEFS: 23 out of 80   Time 4   Period Weeks   Status New   Target Date 12/17/16     PT LONG TERM GOAL #2   Title Pt. will increase L knee AROM (0 to >115 deg.) to improve ability to get in/out of car and off commode independently.     Baseline L knee  AROM: -10 to 95 deg.    Time 4   Period Weeks   Status New   Target Date 12/17/16     PT LONG TERM GOAL #3   Title Pt. will increase B LE muscle strength to grossly 4+/5 MMT to improve standing tolerance at work/ functional mobility.     Baseline L LE muscle strength grossly 4/5 MMT and R hip flexion 4/5 MMT   Time 4   Period Weeks   Status New   Target Date 12/17/16     PT LONG TERM GOAL #4   Title Pt. will ambulate with proper use of SPC and 2-point gait pattern to improve mod. independence with gait.     Baseline pt. ambulates with L antalgic gait and use of RW   Time 4   Period Weeks   Status New   Target Date 12/17/16     PT LONG TERM GOAL #5   Title Pt. able to ascend/descend 8 step with recip. pattern and use of 1 handrail safely to improve mobility.     Baseline step to gait pattern with stairs and B UE assist.    Time 4   Period Weeks   Status New   Target Date 12/17/16               Plan - 10/29/16 1453    Clinical Impression Statement Pt. continues to progress well with L knee ROM/ gait mod. independence with use of SPC.  Pt. reports no increase c/o L knee pain during manual tx./ ther.ex. progression.  Pt. ambulates with consistent 2-point gait pattern with use of SPC on level surfaces and consistent heel strike.  L knee flexion (104 deg.) in supine position.     Clinical Presentation Stable   Clinical Decision Making Moderate   Rehab Potential Good   PT Frequency 2x / week   PT Duration 4 weeks   PT Treatment/Interventions ADLs/Self Care Home Management;Cryotherapy;Balance training;Therapeutic exercise;Therapeutic activities;Functional mobility training;Stair training;Gait training;Neuromuscular re-education;Patient/family education;Passive range of motion;Scar mobilization;Manual techniques   PT Next Visit Plan Increase L knee ROM/ check incision healing.  SEND MD NOTE PRIOR TO F/U   PT Home Exercise Plan see HEP   Consulted and Agree with Plan of Care  Patient      Patient will benefit  from skilled therapeutic intervention in order to improve the following deficits and impairments:  Abnormal gait, Pain, Decreased scar mobility, Decreased mobility, Decreased activity tolerance, Decreased endurance, Decreased range of motion, Decreased strength, Hypomobility, Impaired flexibility, Increased edema, Difficulty walking, Decreased balance  Visit Diagnosis: Joint stiffness of knee, left  Muscle weakness  Gait difficulty  Acute pain of left knee  S/P TKR (total knee replacement), left     Problem List Patient Active Problem List   Diagnosis Date Noted  . 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 Hyponatremia 10/02/2016  . Mixed hyperlipidemia 10/02/2016  . Lower extremity edema 03/27/2016  . Osteoarthritis 03/27/2016  . Vitamin D deficiency 03/27/2016  . Seasonal allergies 03/27/2016  . Ptosis of eyelid, left 03/27/2016  . Essential hypertension 03/22/2015  . Gastroesophageal reflux disease without esophagitis 03/22/2015  . Hypothyroidism due to acquired atrophy of thyroid 03/22/2015  . S/P right TKA 03/20/2014  . S/P knee replacement 03/20/2014   Pura Spice, PT, DPT # [id number] 10/30/2016, 4:05 PM  [facility] [address] [place], [place], [address] Phone: [phone]   Fax:  [phone]  Name: Julie Castillo MRN: [id number] Date of Birth: 05-20-1955

## 2016-10-30 ENCOUNTER — Encounter: Payer: Self-pay | Admitting: Physical Therapy

## 2016-11-03 ENCOUNTER — Ambulatory Visit: Payer: 59 | Admitting: Physical Therapy

## 2016-11-03 DIAGNOSIS — R269 Unspecified abnormalities of gait and mobility: Secondary | ICD-10-CM

## 2016-11-03 DIAGNOSIS — M25662 Stiffness of left knee, not elsewhere classified: Secondary | ICD-10-CM | POA: Diagnosis not present

## 2016-11-03 DIAGNOSIS — Z96652 Presence of left artificial knee joint: Secondary | ICD-10-CM

## 2016-11-03 DIAGNOSIS — M6281 Muscle weakness (generalized): Secondary | ICD-10-CM | POA: Diagnosis not present

## 2016-11-03 DIAGNOSIS — M25562 Pain in left knee: Secondary | ICD-10-CM

## 2016-11-03 NOTE — Therapy (Signed)
[facility] [address] [place], [place], [address] Phone: [phone]   Fax:  [phone]  Physical Therapy Treatment  Patient Details  Name: Julie Castillo MRN: [id number] Date of Birth: November 08, 1955 Referring Provider: Dr. Alvan Dame  Encounter Date: 11/03/2016      PT End of Session - 11/04/16 1953    Visit Number 4   Number of Visits 8   Date for PT Re-Evaluation 11/19/16   PT Start Time 1435   PT Stop Time 1526   PT Time Calculation (min) 51 min   Activity Tolerance Patient tolerated treatment well   Behavior During Therapy [facility] for tasks assessed/performed      Past Medical History:  Diagnosis Date  . Arthritis   . Edema    lower extremities after standing a long time   . Family history of adverse reaction to anesthesia    father- post op became violent   . Foot drop, right 2016   developed foot drop following RTKA in 2016; had to wear foot drop shoe following discharge ; per pat "i had pain in my ankle real bad but it ended up being dx as foot drop"  . GERD (gastroesophageal reflux disease)   . Hypertension   . Hypothyroidism   . Pneumonia    hx of   . PONV (postoperative nausea and vomiting)    post op day 1   . Varicose veins     Past Surgical History:  Procedure Laterality Date  . BREAST CYST ASPIRATION Left 1988   "it just a fineneedle aspiration"   . EYE SURGERY  2001,2005   left eye surgery x 2 ( eyelid)   . 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 TOTAL KNEE ARTHROPLASTY Left 10/19/2016   Procedure: LEFT TOTAL KNEE ARTHROPLASTY;  Surgeon: Paralee Cancel, MD;  Location: [facility];  Service: Orthopedics;  Laterality: Left;    There were no vitals filed for this visit.      Subjective Assessment - 11/04/16 1951    Subjective Pt. states she is hoping to return to driving  after MD f/u tomorrow.  Pt. entered PT clinic with use of SPC.  Minimal c/o L knee pain 3/10 pain.     Pertinent History R TKA 2 years ago (doing well).     Limitations Lifting;Standing;Walking;House hold activities   Patient Stated Goals Increase L knee ROM/ strength to improve pain-free mobility and return to work.     Currently in Pain? Yes   Pain Score 3    Pain Location Knee   Pain Orientation Left   Pain Descriptors / Indicators Aching   Pain Type Surgical pain     3-4/10 L knee pain.  Pt. Reports being really uncomfortable at night.  No pain medications.  Pt. Taking 2 Tylenol prior to PT tx. Session.  MD appt. Tomorrow.      TREATMENT  L knee AROM at end of session(in deg):  F (seated): 110 E (supine): -5  ?  Therapeutic Exercise:   Nustep L6 12 min. B UE/LE seat #9-7 (no charge/ warm-up).    Seated L LAQ/ hip flexion 20x each.   Standing L knee flexion/ abduction AROM x 20 holding onto // bars for support   L LE step ups 10x2 with UE assist in //-bars.   Marching in standing/ heel  raises x20 each LE (cuing for posture)- fatigue noted.  Sidestepping L/R in //-bars (light UE assist).  Discussed HEP.   Manual tx.:   Supine L knee AA/PROM flexion and extension 10x.  Patellar mobs. (all planes)- moderate effusion in L knee under bandage   Pt. Will ice L knee in car on way home after tx.            PT Long Term Goals - 10/23/16 1813      PT LONG TERM GOAL #1   Title Pt. will increase LEFS to >40 out of 80 to promote return to work.     Baseline LEFS: 23 out of 80   Time 4   Period Weeks   Status New   Target Date 12/17/16     PT LONG TERM GOAL #2   Title Pt. will increase L knee AROM (0 to >115 deg.) to improve ability to get in/out of car and off commode independently.     Baseline L knee AROM: -10 to 95 deg.    Time 4   Period Weeks   Status New   Target Date 12/17/16     PT LONG TERM GOAL #3   Title Pt. will increase B LE  muscle strength to grossly 4+/5 MMT to improve standing tolerance at work/ functional mobility.     Baseline L LE muscle strength grossly 4/5 MMT and R hip flexion 4/5 MMT   Time 4   Period Weeks   Status New   Target Date 12/17/16     PT LONG TERM GOAL #4   Title Pt. will ambulate with proper use of SPC and 2-point gait pattern to improve mod. independence with gait.     Baseline pt. ambulates with L antalgic gait and use of RW   Time 4   Period Weeks   Status New   Target Date 12/17/16     PT LONG TERM GOAL #5   Title Pt. able to ascend/descend 8 step with recip. pattern and use of 1 handrail safely to improve mobility.     Baseline step to gait pattern with stairs and B UE assist.    Time 4   Period Weeks   Status New   Target Date 12/17/16               Plan - 11/04/16 1954    Clinical Impression Statement Pt. progressing well with L knee AROM: -5 to 110 deg.  Pt. ambulates with 2-poing reciprocal gait pattern with use of SPC.  Pt. demonstrates good L hip/knee flexion and heel strike.  Pt. able to step up with L LE and no increase c/o pain.  Difficulty with toe raises in standing posture.     Clinical Presentation Stable   Clinical Decision Making Moderate   Rehab Potential Good   PT Frequency 2x / week   PT Duration 4 weeks   PT Treatment/Interventions ADLs/Self Care Home Management;Cryotherapy;Balance training;Therapeutic exercise;Therapeutic activities;Functional mobility training;Stair training;Gait training;Neuromuscular re-education;Patient/family education;Passive range of motion;Scar mobilization;Manual techniques   PT Next Visit Plan Discuss MD f/u and return to driving.     PT Home Exercise Plan see HEP   Consulted and Agree with Plan of Care Patient      Patient will benefit from skilled therapeutic intervention in order to improve the following deficits and impairments:  Abnormal gait, Pain, Decreased scar mobility, Decreased mobility, Decreased activity  tolerance, Decreased endurance, Decreased range of motion, Decreased strength, Hypomobility, Impaired flexibility, Increased edema,  Difficulty walking, Decreased balance  Visit Diagnosis: Joint stiffness of knee, left  Muscle weakness  Gait difficulty  Acute pain of left knee  S/P TKR (total knee replacement), left     Problem List Patient Active Problem List   Diagnosis Date Noted  . 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 Hyponatremia 10/02/2016  . Mixed hyperlipidemia 10/02/2016  . Lower extremity edema 03/27/2016  . Osteoarthritis 03/27/2016  . Vitamin D deficiency 03/27/2016  . Seasonal allergies 03/27/2016  . Ptosis of eyelid, left 03/27/2016  . Essential hypertension 03/22/2015  . Gastroesophageal reflux disease without esophagitis 03/22/2015  . Hypothyroidism due to acquired atrophy of thyroid 03/22/2015  . S/P right TKA 03/20/2014  . S/P knee replacement 03/20/2014   Pura Spice, PT, DPT # [id number] 11/04/2016, 7:57 PM  [facility] [address] [place], [place], [address] Phone: [phone]   Fax:  [phone]  Name: JESENIA SPERA MRN: [id number] Date of Birth: 05-01-1955

## 2016-11-05 ENCOUNTER — Encounter: Payer: Self-pay | Admitting: Physical Therapy

## 2016-11-05 ENCOUNTER — Ambulatory Visit: Payer: 59 | Admitting: Physical Therapy

## 2016-11-05 DIAGNOSIS — M25562 Pain in left knee: Secondary | ICD-10-CM

## 2016-11-05 DIAGNOSIS — M25662 Stiffness of left knee, not elsewhere classified: Secondary | ICD-10-CM | POA: Diagnosis not present

## 2016-11-05 DIAGNOSIS — Z96652 Presence of left artificial knee joint: Secondary | ICD-10-CM

## 2016-11-05 DIAGNOSIS — R269 Unspecified abnormalities of gait and mobility: Secondary | ICD-10-CM | POA: Diagnosis not present

## 2016-11-05 DIAGNOSIS — M6281 Muscle weakness (generalized): Secondary | ICD-10-CM | POA: Diagnosis not present

## 2016-11-05 NOTE — Therapy (Addendum)
[place] [facility] [address]. [place], [place], [address] Phone: [phone]   Fax:  [phone]  Physical Therapy Treatment  Patient Details  Name: Julie Castillo MRN: [id number] Date of Birth: 1955/11/25 Referring Provider: Dr. Alvan Dame  Encounter Date: 11/05/2016      PT End of Session - 11/06/16 0052    Visit Number 5   Number of Visits 8   Date for PT Re-Evaluation 11/19/16   PT Start Time 0539   PT Stop Time 1529   PT Time Calculation (min) 55 min   Activity Tolerance Patient tolerated treatment well   Behavior During Therapy [facility] for tasks assessed/performed      Past Medical History:  Diagnosis Date  . Arthritis   . Edema    lower extremities after standing a long time   . Family history of adverse reaction to anesthesia    father- post op became violent   . Foot drop, right 2016   developed foot drop following RTKA in 2016; had to wear foot drop shoe following discharge ; per pat "i had pain in my ankle real bad but it ended up being dx as foot drop"  . GERD (gastroesophageal reflux disease)   . Hypertension   . Hypothyroidism   . Pneumonia    hx of   . PONV (postoperative nausea and vomiting)    post op day 1   . Varicose veins     Past Surgical History:  Procedure Laterality Date  . BREAST CYST ASPIRATION Left 1988   "it just a fineneedle aspiration"   . EYE SURGERY  2001,2005   left eye surgery x 2 ( eyelid)   . 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 TOTAL KNEE ARTHROPLASTY Left 10/19/2016   Procedure: LEFT TOTAL KNEE ARTHROPLASTY;  Surgeon: Paralee Cancel, MD;  Location: [facility];  Service: Orthopedics;  Laterality: Left;    There were no vitals filed for this visit.      Subjective Assessment - 11/05/16 1450    Pertinent History R TKA 2 years ago (doing well).     Limitations Lifting;Standing;Walking;House hold activities   Patient Stated Goals Increase L knee ROM/ strength to improve pain-free mobility and return to work.       Pt. states incision looks good after MD cleaned up bandage.  Swelling/ redness noted with scabs present.  Minimal L knee pain reported.  Pt. drove to PT clinic today.       MD f/u 10/12 with PA at office.   MD happy with progress.    2/10 L knee pain.  Took ibuprofen last night and slept much better.         TREATMENT  L knee AROM at end of session(in deg):  F (seated): 114 E (supine): -4  ?  Therapeutic Exercise:   Nustep L6 10 min. B UE/LE seat #9-7 (no charge/ warm-up).  Seated/ standing there.ex./ forward/ backwards/ sidestepping.   Standing lunges on 6" step and step ups 10x2 each.    Seated L LAQ/ hip flexion 20x each. Standing L knee flexion/ abductionAROM x 20holding onto // bars for support  Recip. Step ups/ step to with descending 5x on stairs with min. Handrail use.    Marching in standing/ heel raisesx20 each LE (cuing for posture)- fatigue noted.   Manual tx.:  Supine  L knee AA/PROM flexion and extension 10x. Patellar mobs. (all planes)- moderate effusion in L knee under bandage     Pt. Will ice L knee in car on way home after tx.       Pt. presents with moderate L knee swelling:  joint line (45.5 cm)/ quad (47 cm)/ mid-calf (36 cm).  L knee AROM after manual tx. (-4 to 114 deg.).  Pt. ambulates with increase hip/knee flexion since removal of bandage wiht use of SPC in clinic.  Pt. able to ascend stairs with recip. pattern but requires step pattern with descending at this time.        PT Long Term Goals - 10/23/16 1813      PT LONG TERM GOAL #1   Title Pt. will increase LEFS to >40 out of 80 to promote return to work.     Baseline LEFS: 23 out of 80   Time 4   Period Weeks   Status New   Target Date 12/17/16     PT LONG TERM GOAL #2   Title Pt. will increase L  knee AROM (0 to >115 deg.) to improve ability to get in/out of car and off commode independently.     Baseline L knee AROM: -10 to 95 deg.    Time 4   Period Weeks   Status New   Target Date 12/17/16     PT LONG TERM GOAL #3   Title Pt. will increase B LE muscle strength to grossly 4+/5 MMT to improve standing tolerance at work/ functional mobility.     Baseline L LE muscle strength grossly 4/5 MMT and R hip flexion 4/5 MMT   Time 4   Period Weeks   Status New   Target Date 12/17/16     PT LONG TERM GOAL #4   Title Pt. will ambulate with proper use of SPC and 2-point gait pattern to improve mod. independence with gait.     Baseline pt. ambulates with L antalgic gait and use of RW   Time 4   Period Weeks   Status New   Target Date 12/17/16     PT LONG TERM GOAL #5   Title Pt. able to ascend/descend 8 step with recip. pattern and use of 1 handrail safely to improve mobility.     Baseline step to gait pattern with stairs and B UE assist.    Time 4   Period Weeks   Status New   Target Date 12/17/16           Plan - 11/06/16 0053    Clinical Presentation Stable   Clinical Decision Making Moderate   Rehab Potential Good   PT Frequency 2x / week   PT Duration 4 weeks   PT Treatment/Interventions ADLs/Self Care Home Management;Cryotherapy;Balance training;Therapeutic exercise;Therapeutic activities;Functional mobility training;Stair training;Gait training;Neuromuscular re-education;Patient/family education;Passive range of motion;Scar mobilization;Manual techniques   PT Next Visit Plan Increase L knee ROM/ strength   PT Home Exercise Plan see HEP   Consulted and Agree with Plan of Care Patient      Patient will benefit from skilled therapeutic intervention in order to improve the following deficits and impairments:  Abnormal gait, Pain, Decreased scar mobility, Decreased mobility, Decreased activity tolerance, Decreased endurance, Decreased range of motion, Decreased  strength, Hypomobility, Impaired flexibility, Increased edema, Difficulty walking, Decreased balance  Visit Diagnosis: Joint stiffness of knee, left  Muscle weakness  Gait difficulty  Acute pain of left knee  S/P TKR (total knee replacement),  left     Problem List Patient Active Problem List   Diagnosis Date Noted  . 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 Hyponatremia 10/02/2016  . Mixed hyperlipidemia 10/02/2016  . Lower extremity edema 03/27/2016  . Osteoarthritis 03/27/2016  . Vitamin D deficiency 03/27/2016  . Seasonal allergies 03/27/2016  . Ptosis of eyelid, left 03/27/2016  . Essential hypertension 03/22/2015  . Gastroesophageal reflux disease without esophagitis 03/22/2015  . Hypothyroidism due to acquired atrophy of thyroid 03/22/2015  . S/P right TKA 03/20/2014  . S/P knee replacement 03/20/2014   Pura Spice, PT, DPT # [id number] 11/06/2016, 12:55 AM  [facility] [address] [place], [place], [address] Phone: [phone]   Fax:  [phone]  Name: Julie Castillo MRN: [id number] Date of Birth: 11/19/1955

## 2016-11-10 ENCOUNTER — Ambulatory Visit: Payer: 59 | Admitting: Physical Therapy

## 2016-11-10 DIAGNOSIS — Z96652 Presence of left artificial knee joint: Secondary | ICD-10-CM

## 2016-11-10 DIAGNOSIS — M25562 Pain in left knee: Secondary | ICD-10-CM

## 2016-11-10 DIAGNOSIS — M6281 Muscle weakness (generalized): Secondary | ICD-10-CM

## 2016-11-10 DIAGNOSIS — R269 Unspecified abnormalities of gait and mobility: Secondary | ICD-10-CM

## 2016-11-10 DIAGNOSIS — M25662 Stiffness of left knee, not elsewhere classified: Secondary | ICD-10-CM | POA: Diagnosis not present

## 2016-11-10 NOTE — Therapy (Signed)
[place] [facility] [address] [place], [place], [address] Phone: [phone]   Fax:  [phone]  Physical Therapy Treatment  Patient Details  Name: Julie Castillo MRN: [id number] Date of Birth: 1955/08/18 Referring Provider: Dr. Alvan Dame  Encounter Date: 11/10/2016      PT End of Session - 11/10/16 1644    Visit Number 6   Number of Visits 8   Date for PT Re-Evaluation 11/19/16   PT Start Time 1324   PT Stop Time 1526   PT Time Calculation (min) 66 min   Activity Tolerance Patient tolerated treatment well   Behavior During Therapy [facility] for tasks assessed/performed      Past Medical History:  Diagnosis Date  . Arthritis   . Edema    lower extremities after standing a long time   . Family history of adverse reaction to anesthesia    father- post op became violent   . Foot drop, right 2016   developed foot drop following RTKA in 2016; had to wear foot drop shoe following discharge ; per pat "i had pain in my ankle real bad but it ended up being dx as foot drop"  . GERD (gastroesophageal reflux disease)   . Hypertension   . Hypothyroidism   . Pneumonia    hx of   . PONV (postoperative nausea and vomiting)    post op day 1   . Varicose veins     Past Surgical History:  Procedure Laterality Date  . BREAST CYST ASPIRATION Left 1988   "it just a fineneedle aspiration"   . EYE SURGERY  2001,2005   left eye surgery x 2 ( eyelid)   . 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 TOTAL KNEE ARTHROPLASTY Left 10/19/2016   Procedure: LEFT TOTAL KNEE ARTHROPLASTY;  Surgeon: Paralee Cancel, MD;  Location: [facility];  Service: Orthopedics;  Laterality: Left;    There were no vitals filed for this visit.      Subjective Assessment - 11/10/16 1631    Subjective Pt. arrived an hour early for PT.  Pt. entered PT  with use of SPC and has been driving with no issues.  Pt. is 3 weeks and 1 day post-op.  Pt. reports good compliance with HEP/ icing knee.     Pertinent History R TKA 2 years ago (doing well).     Limitations Lifting;Standing;Walking;House hold activities   Patient Stated Goals Increase L knee ROM/ strength to improve pain-free mobility and return to work.     Currently in Pain? Yes   Pain Score 2    Pain Location Knee   Pain Orientation Left   Pain Descriptors / Indicators Aching   Pain Type Surgical pain        TREATMENT  L knee AROM at end of session(in deg):  F (seated): 116 E (supine): -5  ?  Therapeutic Exercise:   Nustep L6 12 min. B UE/LE seat #9-7 (no charge/ warm-up).  2nd step L knee flexion with static holds as tolerated 10x (as tolerated)- B UE/ handrail assist Walking in clinic with step pattern focus with/without use of SPC.   TG knee flexion 20x with holds (as tolerated)- pt. Fatigued/ pain limited.  Supine quad sets with 10 sec. Holds 10x/ knee to chest with white theraball 20x. Discussed HEP.   Manual tx.:  Supine L knee AA/PROM flexion and extension 5x.  Making good progress with knee flexion/ pain limited extension.   Patellar mobs. (all planes) with overpressure to increase knee ext. (limited by L knee joint line swelling).   Good incision healing/ few scabs present  Ice to L knee in supine/ elevated knee position for 10 min. After tx. Session.            PT Long Term Goals - 10/23/16 1813      PT LONG TERM GOAL #1   Title Pt. will increase LEFS to >40 out of 80 to promote return to work.     Baseline LEFS: 23 out of 80   Time 4   Period Weeks   Status New   Target Date 12/17/16     PT LONG TERM GOAL #2   Title Pt. will increase L knee AROM (0 to >115 deg.) to improve ability to get in/out of car and off commode independently.     Baseline L knee AROM: -10 to 95 deg.    Time 4   Period Weeks   Status New   Target Date  12/17/16     PT LONG TERM GOAL #3   Title Pt. will increase B LE muscle strength to grossly 4+/5 MMT to improve standing tolerance at work/ functional mobility.     Baseline L LE muscle strength grossly 4/5 MMT and R hip flexion 4/5 MMT   Time 4   Period Weeks   Status New   Target Date 12/17/16     PT LONG TERM GOAL #4   Title Pt. will ambulate with proper use of SPC and 2-point gait pattern to improve mod. independence with gait.     Baseline pt. ambulates with L antalgic gait and use of RW   Time 4   Period Weeks   Status New   Target Date 12/17/16     PT LONG TERM GOAL #5   Title Pt. able to ascend/descend 8 step with recip. pattern and use of 1 handrail safely to improve mobility.     Baseline step to gait pattern with stairs and B UE assist.    Time 4   Period Weeks   Status New   Target Date 12/17/16               Plan - 11/10/16 1645    Clinical Impression Statement Pt. ambulates with more consistent step pattern/ 2-point gait pattern with use of SPC.  Pt. able to demonstrate ability to ambulate short distances in PT clinic without use of SPC safely.  Decrease L knee joint line swelling by 1 cm as compared to last week (today 44.5 cm).  Good incision healing with minimal scabbing present.  Mild warmth noted around joint line.  L knee AROM in supine after manual tx.:  -5 to 116 deg.   Pt. will continue to benefit from L knee ROM/ stability ex. program to improve return to work over next couple of weeks.  MD appt. in 3 weeks.       Clinical Presentation Stable   Clinical Decision Making Moderate   Rehab Potential Good   PT Frequency 2x / week   PT Duration 4 weeks   PT Treatment/Interventions ADLs/Self Care Home Management;Cryotherapy;Balance training;Therapeutic exercise;Therapeutic activities;Functional mobility training;Stair training;Gait training;Neuromuscular re-education;Patient/family education;Passive range of motion;Scar mobilization;Manual techniques   PT  Next Visit Plan Increase L knee ROM (extension focus)/ strength training   PT Home Exercise Plan see HEP  Consulted and Agree with Plan of Care Patient      Patient will benefit from skilled therapeutic intervention in order to improve the following deficits and impairments:  Abnormal gait, Pain, Decreased scar mobility, Decreased mobility, Decreased activity tolerance, Decreased endurance, Decreased range of motion, Decreased strength, Hypomobility, Impaired flexibility, Increased edema, Difficulty walking, Decreased balance  Visit Diagnosis: Joint stiffness of knee, left  Muscle weakness  Gait difficulty  Acute pain of left knee  S/P TKR (total knee replacement), left     Problem List Patient Active Problem List   Diagnosis Date Noted  . 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 Hyponatremia 10/02/2016  . Mixed hyperlipidemia 10/02/2016  . Lower extremity edema 03/27/2016  . Osteoarthritis 03/27/2016  . Vitamin D deficiency 03/27/2016  . Seasonal allergies 03/27/2016  . Ptosis of eyelid, left 03/27/2016  . Essential hypertension 03/22/2015  . Gastroesophageal reflux disease without esophagitis 03/22/2015  . Hypothyroidism due to acquired atrophy of thyroid 03/22/2015  . S/P right TKA 03/20/2014  . S/P knee replacement 03/20/2014   Pura Spice, PT, DPT # [id number] 11/10/2016, 4:52 PM  [place] [facility] [address] [place], [place], [address] Phone: [phone]   Fax:  [phone]  Name: Julie Castillo MRN: [id number] Date of Birth: 1956-02-08

## 2016-11-12 ENCOUNTER — Ambulatory Visit: Payer: 59

## 2016-11-12 DIAGNOSIS — M6281 Muscle weakness (generalized): Secondary | ICD-10-CM

## 2016-11-12 DIAGNOSIS — M25662 Stiffness of left knee, not elsewhere classified: Secondary | ICD-10-CM | POA: Diagnosis not present

## 2016-11-12 DIAGNOSIS — Z96652 Presence of left artificial knee joint: Secondary | ICD-10-CM | POA: Diagnosis not present

## 2016-11-12 DIAGNOSIS — R269 Unspecified abnormalities of gait and mobility: Secondary | ICD-10-CM | POA: Diagnosis not present

## 2016-11-12 DIAGNOSIS — M25562 Pain in left knee: Secondary | ICD-10-CM | POA: Diagnosis not present

## 2016-11-12 NOTE — Therapy (Signed)
[place] [facility] [address]. [place], [place], [address] Phone: [phone]   Fax:  [phone]  Physical Therapy Treatment  Patient Details  Name: Julie Castillo MRN: [id number] Date of Birth: [birth year] Referring Provider: Dr. Alvan Dame  Encounter Date: 11/12/2016      PT End of Session - 11/12/16 1514    Visit Number 7   Number of Visits 8   Date for PT Re-Evaluation 11/19/16   PT Start Time 3875   PT Stop Time 1600   PT Time Calculation (min) 45 min   Activity Tolerance Patient tolerated treatment well   Behavior During Therapy [facility] for tasks assessed/performed      Past Medical History:  Diagnosis Date  . Arthritis   . Edema    lower extremities after standing a long time   . Family history of adverse reaction to anesthesia    father- post op became violent   . Foot drop, right 2016   developed foot drop following RTKA in 2016; had to wear foot drop shoe following discharge ; per pat "i had pain in my ankle real bad but it ended up being dx as foot drop"  . GERD (gastroesophageal reflux disease)   . Hypertension   . Hypothyroidism   . Pneumonia    hx of   . PONV (postoperative nausea and vomiting)    post op day 1   . Varicose veins     Past Surgical History:  Procedure Laterality Date  . BREAST CYST ASPIRATION Left 1988   "it just a fineneedle aspiration"   . EYE SURGERY  2001,2005   left eye surgery x 2 ( eyelid)   . 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 TOTAL KNEE ARTHROPLASTY Left 10/19/2016   Procedure: LEFT TOTAL KNEE ARTHROPLASTY;  Surgeon: Paralee Cancel, MD;  Location: [facility];  Service: Orthopedics;  Laterality: Left;    There were no vitals filed for this visit.      Subjective Assessment - 11/12/16 1514    Subjective Pt reports she is doing well on this date. She  continues to utilize spc for ambulation and has been driving. No pain reported today. HEP is going well. No specific questions or concerns at this time.    Pertinent History R TKA 2 years ago (doing well).     Limitations Lifting;Standing;Walking;House hold activities   Patient Stated Goals Increase L knee ROM/ strength to improve pain-free mobility and return to work.     Currently in Pain? No/denies   Pain Location --   Pain Orientation --   Pain Descriptors / Indicators --   Pain Type --        TREATMENT   Therapeutic Exercise Nustep L6 x 6 min. B UE/LE seat #9 during history for warm-up;  2nd step L knee flexion with static holds as tolerated 30s hold x 5, B UE/ handrail assist  TG L single leg squat L12 3 x 10, pt reports moderate to hard difficulty with appropriate muscle fatigue toward the end of each set, no pain reported but minimal superior knee pulling;  BOSU (round side up) forward lunges leading with LLE 2 x 10; BOSU (round side up) left lateral lunges 2 x 10; Supine quad sets 10s hold x 5;  Manual tx.:  ?  Patellar mobs superior/inferior grade III, 30s/bout  x 3 each; R tibia on femur AP mobs at end range flexion grade III, 30s/bout x 3; R femur on tibia AP mobs at end range extension grade III, 30s/bout x 3; R tibia ER mobs in hooklying grade III, 30s/bout x 3;                              PT Education - 11/12/16 1514    Education provided Yes   Education Details Exercise form/technique   Person(s) Educated Patient   Methods Explanation   Comprehension Verbalized understanding             PT Long Term Goals - 10/23/16 1813      PT LONG TERM GOAL #1   Title Pt. will increase LEFS to >40 out of 80 to promote return to work.     Baseline LEFS: 23 out of 80   Time 4   Period Weeks   Status New   Target Date 12/17/16     PT LONG TERM GOAL #2   Title Pt. will increase L knee AROM (0 to >115 deg.) to improve ability to get in/out  of car and off commode independently.     Baseline L knee AROM: -10 to 95 deg.    Time 4   Period Weeks   Status New   Target Date 12/17/16     PT LONG TERM GOAL #3   Title Pt. will increase B LE muscle strength to grossly 4+/5 MMT to improve standing tolerance at work/ functional mobility.     Baseline L LE muscle strength grossly 4/5 MMT and R hip flexion 4/5 MMT   Time 4   Period Weeks   Status New   Target Date 12/17/16     PT LONG TERM GOAL #4   Title Pt. will ambulate with proper use of SPC and 2-point gait pattern to improve mod. independence with gait.     Baseline pt. ambulates with L antalgic gait and use of RW   Time 4   Period Weeks   Status New   Target Date 12/17/16     PT LONG TERM GOAL #5   Title Pt. able to ascend/descend 8 step with recip. pattern and use of 1 handrail safely to improve mobility.     Baseline step to gait pattern with stairs and B UE assist.    Time 4   Period Weeks   Status New   Target Date 12/17/16               Plan - 11/12/16 1514    Clinical Impression Statement Pt is making excellent progress with therapy. She demonstrates improved ambulation without spc around gym during session. She reports some anterior knee "pulling" during exercises today but denies pain at end of session. Pt will continue to benefit from PT services to address deficits in strength and mobility in order to return to full function.    Rehab Potential Good   PT Frequency 2x / week   PT Duration 4 weeks   PT Treatment/Interventions ADLs/Self Care Home Management;Cryotherapy;Balance training;Therapeutic exercise;Therapeutic activities;Functional mobility training;Stair training;Gait training;Neuromuscular re-education;Patient/family education;Passive range of motion;Scar mobilization;Manual techniques   PT Next Visit Plan Increase L knee ROM (extension focus)/ strength training   PT Home Exercise Plan see HEP   Consulted and Agree with Plan of Care Patient       Patient will benefit from skilled therapeutic intervention in order  to improve the following deficits and impairments:  Abnormal gait, Pain, Decreased scar mobility, Decreased mobility, Decreased activity tolerance, Decreased endurance, Decreased range of motion, Decreased strength, Hypomobility, Impaired flexibility, Increased edema, Difficulty walking, Decreased balance  Visit Diagnosis: Joint stiffness of knee, left  Muscle weakness     Problem List Patient Active Problem List   Diagnosis Date Noted  . 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 Hyponatremia 10/02/2016  . Mixed hyperlipidemia 10/02/2016  . Lower extremity edema 03/27/2016  . Osteoarthritis 03/27/2016  . Vitamin D deficiency 03/27/2016  . Seasonal allergies 03/27/2016  . Ptosis of eyelid, left 03/27/2016  . Essential hypertension 03/22/2015  . Gastroesophageal reflux disease without esophagitis 03/22/2015  . Hypothyroidism due to acquired atrophy of thyroid 03/22/2015  . S/P right TKA 03/20/2014  . S/P knee replacement 03/20/2014   Phillips Grout PT, DPT   Huprich,Jason 11/12/2016, 5:13 PM  [place] [facility] [address] [place], [place], [address] Phone: [phone]   Fax:  [phone]  Name: Julie Castillo MRN: [id number] Date of Birth: [birth year]

## 2016-11-17 ENCOUNTER — Encounter: Payer: 59 | Admitting: Physical Therapy

## 2016-11-17 ENCOUNTER — Ambulatory Visit: Payer: 59 | Admitting: Physical Therapy

## 2016-11-17 DIAGNOSIS — R269 Unspecified abnormalities of gait and mobility: Secondary | ICD-10-CM

## 2016-11-17 DIAGNOSIS — M6281 Muscle weakness (generalized): Secondary | ICD-10-CM

## 2016-11-17 DIAGNOSIS — M25562 Pain in left knee: Secondary | ICD-10-CM | POA: Diagnosis not present

## 2016-11-17 DIAGNOSIS — Z96652 Presence of left artificial knee joint: Secondary | ICD-10-CM | POA: Diagnosis not present

## 2016-11-17 DIAGNOSIS — M25662 Stiffness of left knee, not elsewhere classified: Secondary | ICD-10-CM

## 2016-11-19 ENCOUNTER — Encounter: Payer: Self-pay | Admitting: Physical Therapy

## 2016-11-19 ENCOUNTER — Ambulatory Visit: Payer: 59 | Admitting: Physical Therapy

## 2016-11-19 ENCOUNTER — Encounter: Payer: 59 | Admitting: Physical Therapy

## 2016-11-19 DIAGNOSIS — M25562 Pain in left knee: Secondary | ICD-10-CM

## 2016-11-19 DIAGNOSIS — Z96652 Presence of left artificial knee joint: Secondary | ICD-10-CM

## 2016-11-19 DIAGNOSIS — R269 Unspecified abnormalities of gait and mobility: Secondary | ICD-10-CM | POA: Diagnosis not present

## 2016-11-19 DIAGNOSIS — M6281 Muscle weakness (generalized): Secondary | ICD-10-CM | POA: Diagnosis not present

## 2016-11-19 DIAGNOSIS — M25662 Stiffness of left knee, not elsewhere classified: Secondary | ICD-10-CM | POA: Diagnosis not present

## 2016-11-19 NOTE — Therapy (Signed)
[place] [facility] [address]. [place], [place], [address] Phone: [phone]   Fax:  [phone]  Physical Therapy Treatment  Patient Details  Name: Julie Castillo MRN: [id number] Date of Birth: 1955/04/10 Referring Provider: Dr. Alvan Dame  Encounter Date: 11/19/2016      PT End of Session - 11/19/16 1034    Visit Number 9   Number of Visits 12   Date for PT Re-Evaluation 12/17/16   PT Start Time 1018   PT Stop Time 1105   PT Time Calculation (min) 47 min   Activity Tolerance Patient tolerated treatment well   Behavior During Therapy [facility] for tasks assessed/performed      Past Medical History:  Diagnosis Date  . Arthritis   . Edema    lower extremities after standing a long time   . Family history of adverse reaction to anesthesia    father- post op became violent   . Foot drop, right 2016   developed foot drop following RTKA in 2016; had to wear foot drop shoe following discharge ; per pat "i had pain in my ankle real bad but it ended up being dx as foot drop"  . GERD (gastroesophageal reflux disease)   . Hypertension   . Hypothyroidism   . Pneumonia    hx of   . PONV (postoperative nausea and vomiting)    post op day 1   . Varicose veins     Past Surgical History:  Procedure Laterality Date  . BREAST CYST ASPIRATION Left 1988   "it just a fineneedle aspiration"   . EYE SURGERY  2001,2005   left eye surgery x 2 ( eyelid)   . 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 TOTAL KNEE ARTHROPLASTY Left 10/19/2016   Procedure: LEFT TOTAL KNEE ARTHROPLASTY;  Surgeon: Paralee Cancel, MD;  Location: [facility];  Service: Orthopedics;  Laterality: Left;    There were no vitals filed for this visit.      Subjective Assessment - 11/19/16 1046    Subjective Pt. reports good compliance with HEP.  Pt. doing  scar massage and still feels thickening at prox./ distal aspects of incision.  No pain reported at this time.     Pertinent History R TKA 2 years ago (doing well).     Limitations Lifting;Standing;Walking;House hold activities   Patient Stated Goals Increase L knee ROM/ strength to improve pain-free mobility and return to work.     Currently in Pain? No/denies       LEFS: 61 out of 80.      TREATMENT   Therapeutic Exercise Nustep L6 x 6 min. B UE/LE seat #9 during history for warm-up;  Supine knee flexion (120 deg.)/ quad sets (-3 deg.) Agility ladder cone taps L/R (no issues) Recip. Stair climbing with no UE assist 4 steps x 6.  No LOB 5x sit to stands: 14.1 seconds.   Resisted gait 10x all 4-planes (no UE assist).  Added partial lunge on L with forward direction.     Manual tx.:  ?  Patellar mobs superior/inferior grade III, 30s/bout x 3 each; Supine L hamstring (distal/ proximal).          PT Long Term Goals - 11/20/16 0903      PT LONG TERM GOAL #1   Title Pt. will increase LEFS to >40 out  of 80 to promote return to work.     Baseline LEFS: 23 out of 80 on initial eval.  LEFS: 61 out of 80 on 9/27   Time 4   Period Weeks   Status Achieved     PT LONG TERM GOAL #2   Title Pt. will increase L knee AROM (0 to >115 deg.) to improve ability to get in/out of car and off commode independently.     Baseline L knee AROM: -3 to 120 deg.    Time 4   Period Weeks   Status Partially Met     PT LONG TERM GOAL #3   Title Pt. will increase B LE muscle strength to grossly 4+/5 MMT to improve standing tolerance at work/ functional mobility.     Baseline B LE muscle strength grossly 4+/5 MMT   Time 4   Period Weeks   Status Achieved     PT LONG TERM GOAL #4   Title Pt. will ambulate with proper use of SPC and 2-point gait pattern to improve mod. independence with gait.     Baseline Normalized gait pattern on level surfaces   Time 4   Period Weeks   Status Achieved      PT LONG TERM GOAL #5   Title Pt. able to ascend/descend 8 step with recip. pattern and use of 1 handrail safely to improve mobility.     Baseline Goal met   Time 4   Period Weeks   Status Achieved     Additional Long Term Goals   Additional Long Term Goals Yes     PT LONG TERM GOAL #6   Title Pt. able to return to work with no L knee pain or limitations safely.   Baseline Hoping to return to work in 2 weeks.    Time 4   Period Weeks   Status New               Plan - 11/19/16 1035    Clinical Impression Statement L knee AROM in supine: -3 to 120 deg.  Pt. continues to impress with L knee AROM/ gait pattern with no assistive device.  Pt. ambulates with a more consistent normalized gait pattern in clinic with heel strike/ hip and knee flexion.  Pt. able to ascend/ descned stairs with recip. pattern and no increase c/o pain.  Good incision healing and will continue with daily scar massage.  Primary focus on quad/ LE strengthening ex. and walking program.  Pt. return to MD in 2 weeks and will continue on a more independent basis with LE strengthening/ mobility prior to MD appt./ return to work.     Clinical Presentation Stable   Clinical Decision Making Moderate   Rehab Potential Good   PT Frequency 1x / week   PT Duration 4 weeks   PT Treatment/Interventions ADLs/Self Care Home Management;Cryotherapy;Balance training;Therapeutic exercise;Therapeutic activities;Functional mobility training;Stair training;Gait training;Neuromuscular re-education;Patient/family education;Passive range of motion;Scar mobilization;Manual techniques   PT Next Visit Plan 1 more PT appt. scheduled prior to MD f/u.  Pt. instructed to contact PT if any issues.     PT Home Exercise Plan see HEP   Consulted and Agree with Plan of Care Patient      Patient will benefit from skilled therapeutic intervention in order to improve the following deficits and impairments:  Abnormal gait, Pain, Decreased scar  mobility, Decreased mobility, Decreased activity tolerance, Decreased endurance, Decreased range of motion, Decreased strength, Hypomobility, Impaired flexibility, Increased edema, Difficulty walking,  Decreased balance  Visit Diagnosis: Joint stiffness of knee, left  Muscle weakness  Gait difficulty  Acute pain of left knee  S/P TKR (total knee replacement), left       G-Codes - 16-Dec-2016 1206    Functional Assessment Tool Used (Outpatient Only) Clinical impression/ LEFS/ pain/ joint stiffness/ muscle weakness.     Functional Limitation Mobility: Walking and moving around   Mobility: Walking and Moving Around Current Status [phone]) At least 1 percent but less than 20 percent impaired, limited or restricted   Mobility: Walking and Moving Around Goal Status [phone]) 0 percent impaired, limited or restricted      Problem List Patient Active Problem List   Diagnosis Date Noted  . 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 Hyponatremia 10/02/2016  . Mixed hyperlipidemia 10/02/2016  . Lower extremity edema 03/27/2016  . Osteoarthritis 03/27/2016  . Vitamin D deficiency 03/27/2016  . Seasonal allergies 03/27/2016  . Ptosis of eyelid, left 03/27/2016  . Essential hypertension 03/22/2015  . Gastroesophageal reflux disease without esophagitis 03/22/2015  . Hypothyroidism due to acquired atrophy of thyroid 03/22/2015  . S/P right TKA 03/20/2014  . S/P knee replacement 03/20/2014   Pura Spice, PT, DPT # [phone] 11/20/2016, 9:07 AM  [place] [facility] [address] [place], [place], [address] Phone: [phone]   Fax:  [phone]  Name: GRISELA Castillo MRN: [id number] Date of Birth: 1956-01-29

## 2016-11-20 NOTE — Therapy (Addendum)
[place] [facility] [address] [place], [place], [address] Phone: [phone]   Fax:  [phone]  Physical Therapy Treatment  Patient Details  Name: Julie Castillo MRN: [id number] Date of Birth: 10/27/1955 Referring Provider: Dr. Olin  Encounter Date: 11/17/2016  Treatment 8 of 8.  Recert 11/19/16  Past Medical History:  Diagnosis Date  . Arthritis   . Edema    lower extremities after standing a long time   . Family history of adverse reaction to anesthesia    father- post op became violent   . Foot drop, right 2016   developed foot drop following RTKA in 2016; had to wear foot drop shoe following discharge ; per pat "i had pain in my ankle real bad but it ended up being dx as foot drop"  . GERD (gastroesophageal reflux disease)   . Hypertension   . Hypothyroidism   . Pneumonia    hx of   . PONV (postoperative nausea and vomiting)    post op day 1   . Varicose veins     Past Surgical History:  Procedure Laterality Date  . BREAST CYST ASPIRATION Left 1988   "it just a fineneedle aspiration"   . EYE SURGERY  2001,2005   left eye surgery x 2 ( eyelid)   . TOTAL KNEE ARTHROPLASTY Right 03/20/2014   Procedure: RIGHT TOTAL KNEE ARTHROPLASTY;  Surgeon: Matthew D Olin, MD;  [facility]: [facility];  Service: Orthopedics;  Laterality: Right;  . TOTAL KNEE ARTHROPLASTY Left 10/19/2016   Procedure: LEFT TOTAL KNEE ARTHROPLASTY;  Surgeon: Olin, Matthew, MD;  Location: [facility];  Service: Orthopedics;  Laterality: Left;    There were no vitals filed for this visit.      Subjective Assessment - 11/19/16 1046    Subjective Pt. reports good compliance with HEP.  Pt. doing scar massage and still feels thickening at prox./ distal aspects of incision.  No pain reported at this time.     Pertinent History R TKA 2 years ago (doing well).     Limitations Lifting;Standing;Walking;House hold activities   Patient Stated Goals Increase L knee ROM/ strength to  improve pain-free mobility and return to work.     Currently in Pain? No/denies            [facility] PT Assessment - 11/20/16 0001      Assessment   Medical Diagnosis L TKA   Referring Provider Dr. Olin   Onset Date/Surgical Date 10/19/16          Pt. states she is doing well.  No new complaints.  Pt. becoming more active with daily/ household tasks.        Therapeutic Exercise:   Nustep L612min. B UE/LE seat #9-7(no charge/ warm-up).  2nd step L knee flexion with static holds as tolerated 10x (as tolerated)- B UE/ handrail assist.   TG knee flexion 20x with holds (as tolerated)- pt. Fatigued/ pain limited.  Resisted gait pattern 1BTB 10x all 4-planes.   Supine quad sets with 10 sec. Holds 10x/ knee to chest with white theraball 20x. Tandem stance/gait pattern.     Manual tx.:  Supine L knee AA/PROM flexion and extension 5x.   Patellar mobs. (all planes) with overpressure to increase knee ext. (limited by L knee joint line swelling).   Instructed in scar massage.    Pt. Will ice L knee on drive home.        Pt. continues to progress well towards all   PT goals with consistent increase in L knee AROM (-4 to 118 deg.).  Slight antalgic gait pattern but improving step pattern/ cadence without use of assistive device.  Pt. instructed in scar massage to break up thickening under proximal/distal aspects of incision.           PT Long Term Goals - 11/20/16 0903      PT LONG TERM GOAL #1   Title Pt. will increase LEFS to >40 out of 80 to promote return to work.     Baseline LEFS: 23 out of 80 on initial eval.  LEFS: 61 out of 80 on 9/27   Time 4   Period Weeks   Status Achieved     PT LONG TERM GOAL #2   Title Pt. will increase L knee AROM (0 to >115 deg.) to improve ability to get in/out of car and off commode independently.     Baseline L knee AROM: -3 to 120 deg.    Time 4   Period Weeks   Status Partially Met     PT LONG TERM GOAL #3   Title Pt.  will increase B LE muscle strength to grossly 4+/5 MMT to improve standing tolerance at work/ functional mobility.     Baseline B LE muscle strength grossly 4+/5 MMT   Time 4   Period Weeks   Status Achieved     PT LONG TERM GOAL #4   Title Pt. will ambulate with proper use of SPC and 2-point gait pattern to improve mod. independence with gait.     Baseline Normalized gait pattern on level surfaces   Time 4   Period Weeks   Status Achieved     PT LONG TERM GOAL #5   Title Pt. able to ascend/descend 8 step with recip. pattern and use of 1 handrail safely to improve mobility.     Baseline Goal met   Time 4   Period Weeks   Status Achieved     Additional Long Term Goals   Additional Long Term Goals Yes     PT LONG TERM GOAL #6   Title Pt. able to return to work with no L knee pain or limitations safely.   Baseline Hoping to return to work in 2 weeks.    Time 4   Period Weeks   Status New           Patient will benefit from skilled therapeutic intervention in order to improve the following deficits and impairments:  Abnormal gait, Pain, Decreased scar mobility, Decreased mobility, Decreased activity tolerance, Decreased endurance, Decreased range of motion, Decreased strength, Hypomobility, Impaired flexibility, Increased edema, Difficulty walking, Decreased balance  Visit Diagnosis: Joint stiffness of knee, left  Muscle weakness  Gait difficulty  Acute pain of left knee  S/P TKR (total knee replacement), left       G-Codes - 11/19/16 1206    Functional Assessment Tool Used (Outpatient Only) Clinical impression/ LEFS/ pain/ joint stiffness/ muscle weakness.     Functional Limitation Mobility: Walking and moving around   Mobility: Walking and Moving Around Current Status ([id number]) At least 1 percent but less than 20 percent impaired, limited or restricted   Mobility: Walking and Moving Around Goal Status ([id number]) 0 percent impaired, limited or restricted       Problem List Patient Active Problem List   Diagnosis Date Noted  . 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 Hyponatremia 10/02/2016  . Mixed hyperlipidemia 10/02/2016  . Lower extremity edema 03/27/2016  .   Osteoarthritis 03/27/2016  . Vitamin D deficiency 03/27/2016  . Seasonal allergies 03/27/2016  . Ptosis of eyelid, left 03/27/2016  . Essential hypertension 03/22/2015  . Gastroesophageal reflux disease without esophagitis 03/22/2015  . Hypothyroidism due to acquired atrophy of thyroid 03/22/2015  . S/P right TKA 03/20/2014  . S/P knee replacement 03/20/2014   Pura Spice, PT, DPT # [phone] 11/20/2016, 11:50 AM  [place] [facility] [address] [place], [place], [address] Phone: [phone]   Fax:  [phone]  Name: Julie Castillo MRN: [id number] Date of Birth: 1955/04/13

## 2016-12-02 ENCOUNTER — Ambulatory Visit: Payer: 59 | Attending: Orthopedic Surgery | Admitting: Physical Therapy

## 2016-12-02 ENCOUNTER — Encounter: Payer: Self-pay | Admitting: Physical Therapy

## 2016-12-02 DIAGNOSIS — R269 Unspecified abnormalities of gait and mobility: Secondary | ICD-10-CM | POA: Diagnosis not present

## 2016-12-02 DIAGNOSIS — M25562 Pain in left knee: Secondary | ICD-10-CM

## 2016-12-02 DIAGNOSIS — Z96652 Presence of left artificial knee joint: Secondary | ICD-10-CM

## 2016-12-02 DIAGNOSIS — M6281 Muscle weakness (generalized): Secondary | ICD-10-CM | POA: Diagnosis not present

## 2016-12-02 DIAGNOSIS — M25662 Stiffness of left knee, not elsewhere classified: Secondary | ICD-10-CM | POA: Diagnosis not present

## 2016-12-02 NOTE — Therapy (Signed)
[place] [facility] [address]. [place], [place], [address] Phone: [phone]   Fax:  [phone]  Physical Therapy Treatment  Patient Details  Name: Julie Castillo MRN: [id number] Date of Birth: 1956-02-16 Referring Provider: Dr. Alvan Dame  Encounter Date: 12/02/2016      PT End of Session - 12/02/16 1329    Visit Number 10   Number of Visits 12   Date for PT Re-Evaluation 12/17/16   PT Start Time 1016   PT Stop Time 1107   PT Time Calculation (min) 51 min   Activity Tolerance Patient tolerated treatment well   Behavior During Therapy [facility] for tasks assessed/performed      Past Medical History:  Diagnosis Date  . Arthritis   . Edema    lower extremities after standing a long time   . Family history of adverse reaction to anesthesia    father- post op became violent   . Foot drop, right 2016   developed foot drop following RTKA in 2016; had to wear foot drop shoe following discharge ; per pat "i had pain in my ankle real bad but it ended up being dx as foot drop"  . GERD (gastroesophageal reflux disease)   . Hypertension   . Hypothyroidism   . Pneumonia    hx of   . PONV (postoperative nausea and vomiting)    post op day 1   . Varicose veins     Past Surgical History:  Procedure Laterality Date  . BREAST CYST ASPIRATION Left 1988   "it just a fineneedle aspiration"   . EYE SURGERY  2001,2005   left eye surgery x 2 ( eyelid)   . 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 TOTAL KNEE ARTHROPLASTY Left 10/19/2016   Procedure: LEFT TOTAL KNEE ARTHROPLASTY;  Surgeon: Paralee Cancel, MD;  Location: [facility];  Service: Orthopedics;  Laterality: Left;    There were no vitals filed for this visit.      Subjective Assessment - 12/02/16 1324    Subjective Pt. states she has continued to do very well.   Pt. entered PT without use of SPC today.  Pt. hoping to return to work next Monday (12/07/16).     Pertinent History R TKA 2 years ago (doing well).     Limitations Lifting;Standing;Walking;House hold activities   Patient Stated Goals Increase L knee ROM/ strength to improve pain-free mobility and return to work.     Currently in Pain? No/denies        LEFS: 61 out of 80.      TREATMENT   Therapeutic Exercise Nustep L7 x 76mn. B UE/LE seat #9. Supine knee flexion (122 deg.)/ quad sets (-2 deg.).  Quad sets/ SAQ without bolster/ bridging/ SLR. Recip. Stair climbing with no UE assist 4 steps x 6.  No LOB  Airex step ups/ 6" touches with min. To no UE assist.  Resisted gait 10x all 4-planes (no UE assist).   TG knee flexion/ heel raises 30x each. Reviewed HEP in depth.            PT Long Term Goals - 12/02/16 1333      PT LONG TERM GOAL #1   Title Pt. will increase LEFS to >40 out of 80 to promote return to work.     Baseline LEFS: 23 out of  80 on initial eval.  LEFS: 61 out of 80 on 9/27   Time 4   Period Weeks   Status Achieved   Target Date 12/17/16     PT LONG TERM GOAL #2   Title Pt. will increase L knee AROM (0 to >115 deg.) to improve ability to get in/out of car and off commode independently.     Baseline L knee AROM: -2 to 122 deg.    Time 4   Period Weeks   Status Achieved   Target Date 12/17/16     PT LONG TERM GOAL #3   Title Pt. will increase B LE muscle strength to grossly 4+/5 MMT to improve standing tolerance at work/ functional mobility.     Baseline B LE muscle strength grossly 4+/5 MMT   Time 4   Period Weeks   Status Achieved   Target Date 12/17/16     PT LONG TERM GOAL #4   Title Pt. will ambulate with proper use of SPC and 2-point gait pattern to improve mod. independence with gait.     Baseline Normalized gait pattern on level surfaces   Time 4   Period Weeks   Status Achieved   Target Date 12/17/16     PT LONG TERM GOAL #5    Title Pt. able to ascend/descend 8 step with recip. pattern and use of 1 handrail safely to improve mobility.     Baseline Goal met   Time 4   Period Weeks   Status Achieved   Target Date 12/17/16     PT LONG TERM GOAL #6   Title Pt. able to return to work with no L knee pain or limitations safely.   Baseline Pt. able to demonstrate work-related tasks with no L knee pain or limitations.     Time 4   Period Weeks   Status On-going   Target Date 12/17/16               Plan - 12/02/16 1330    Clinical Impression Statement Pt. has progressed well/ achieved all PT goals.  L knee AROM: -2 to 122 deg. in supine position.  Good scar healing/ patellar tracking.  Pt. ambulates with slight L antalgic gait without use of SPC and more normalized gait pattern with light UE assist.  Pt. ascending/ descending stairs with reciprocal gait pattern and min. to no UE assist.  Pt. will be able to return to work without limitations at this time.  Discharge from PT at this time.     Clinical Presentation Stable   Clinical Decision Making Low   Rehab Potential Good   PT Frequency 1x / week   PT Duration 4 weeks   PT Treatment/Interventions ADLs/Self Care Home Management;Cryotherapy;Balance training;Therapeutic exercise;Therapeutic activities;Functional mobility training;Stair training;Gait training;Neuromuscular re-education;Patient/family education;Passive range of motion;Scar mobilization;Manual techniques   PT Next Visit Plan Discharge visit.  Pt. will continue with HEP and hoping to return to work next Monday.     PT Home Exercise Plan see HEP   Consulted and Agree with Plan of Care Patient      Patient will benefit from skilled therapeutic intervention in order to improve the following deficits and impairments:  Abnormal gait, Pain, Decreased scar mobility, Decreased mobility, Decreased activity tolerance, Decreased endurance, Decreased range of motion, Decreased strength, Hypomobility, Impaired  flexibility, Increased edema, Difficulty walking, Decreased balance  Visit Diagnosis: Joint stiffness of knee, left  Muscle weakness  Gait difficulty  Acute pain of left knee  S/P TKR (total knee replacement), left       G-Codes - 12/22/16 1335    Functional Assessment Tool Used (Outpatient Only) Clinical impression/ LEFS/ pain/ joint stiffness/ muscle weakness.     Functional Limitation Mobility: Walking and moving around   Mobility: Walking and Moving Around Current Status [phone]) At least 1 percent but less than 20 percent impaired, limited or restricted   Mobility: Walking and Moving Around Goal Status [phone]) At least 1 percent but less than 20 percent impaired, limited or restricted   Mobility: Walking and Moving Around Discharge Status [phone]) At least 1 percent but less than 20 percent impaired, limited or restricted      Problem List Patient Active Problem List   Diagnosis Date Noted  . 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 Hyponatremia 10/02/2016  . Mixed hyperlipidemia 10/02/2016  . Lower extremity edema 03/27/2016  . Osteoarthritis 03/27/2016  . Vitamin D deficiency 03/27/2016  . Seasonal allergies 03/27/2016  . Ptosis of eyelid, left 03/27/2016  . Essential hypertension 03/22/2015  . Gastroesophageal reflux disease without esophagitis 03/22/2015  . Hypothyroidism due to acquired atrophy of thyroid 03/22/2015  . S/P right TKA 03/20/2014  . S/P knee replacement 03/20/2014   Pura Spice, PT, DPT # [phone] 22-Dec-2016, 1:36 PM  [place] [facility] [address] [place], [place], [address] Phone: [phone]   Fax:  [phone]  Name: Julie Castillo MRN: [id number] Date of Birth: 29-Apr-1955

## 2016-12-04 DIAGNOSIS — Z96652 Presence of left artificial knee joint: Secondary | ICD-10-CM | POA: Diagnosis not present

## 2016-12-04 DIAGNOSIS — Z471 Aftercare following joint replacement surgery: Secondary | ICD-10-CM | POA: Diagnosis not present

## 2017-01-04 ENCOUNTER — Other Ambulatory Visit: Payer: Self-pay

## 2017-01-04 MED ORDER — HYDROCHLOROTHIAZIDE 25 MG PO TABS: 25.0000 mg | ORAL_TABLET | Freq: Every day | ORAL | 0 refills | Status: DC

## 2017-01-04 MED ORDER — OMEPRAZOLE 20 MG PO CPDR: 20.0000 mg | DELAYED_RELEASE_CAPSULE | Freq: Two times a day (BID) | ORAL | 0 refills | Status: DC

## 2017-01-04 MED ORDER — FLUTICASONE PROPIONATE 50 MCG/ACT NA SUSP: 2.0000 | Freq: Every day | NASAL | 2 refills | Status: DC

## 2017-01-18 ENCOUNTER — Other Ambulatory Visit: Payer: Self-pay | Admitting: Internal Medicine

## 2017-01-18 DIAGNOSIS — Z1231 Encounter for screening mammogram for malignant neoplasm of breast: Secondary | ICD-10-CM

## 2017-01-25 ENCOUNTER — Ambulatory Visit
Admission: RE | Admit: 2017-01-25 | Discharge: 2017-01-25 | Disposition: A | Discharge status: Home / Self Care | Payer: 59 | Source: Ambulatory Visit | Attending: Internal Medicine | Admitting: Internal Medicine

## 2017-01-25 DIAGNOSIS — Z1231 Encounter for screening mammogram for malignant neoplasm of breast: Secondary | ICD-10-CM | POA: Insufficient documentation

## 2017-01-26 ENCOUNTER — Ambulatory Visit: Payer: 59

## 2017-01-29 ENCOUNTER — Ambulatory Visit (INDEPENDENT_AMBULATORY_CARE_PROVIDER_SITE_OTHER): Payer: 59 | Admitting: Internal Medicine

## 2017-01-29 ENCOUNTER — Encounter: Payer: Self-pay | Admitting: Internal Medicine

## 2017-01-29 VITALS — BP 128/78 | HR 87 | Resp 16 | Ht 63.0 in | Wt 151.2 lb

## 2017-01-29 DIAGNOSIS — E034 Atrophy of thyroid (acquired): Secondary | ICD-10-CM | POA: Diagnosis not present

## 2017-01-29 DIAGNOSIS — I10 Essential (primary) hypertension: Secondary | ICD-10-CM

## 2017-01-29 DIAGNOSIS — H43391 Other vitreous opacities, right eye: Secondary | ICD-10-CM | POA: Insufficient documentation

## 2017-01-29 DIAGNOSIS — K219 Gastro-esophageal reflux disease without esophagitis: Secondary | ICD-10-CM

## 2017-01-29 MED ORDER — OMEPRAZOLE 20 MG PO CPDR: 20.0000 mg | DELAYED_RELEASE_CAPSULE | Freq: Two times a day (BID) | ORAL | 3 refills | Status: DC

## 2017-01-29 NOTE — Progress Notes (Signed)
Date:  01/29/2017   Name:  Julie Castillo   DOB:  November 15, 1955   MRN:  [id number]   Chief Complaint: Hypertension Hypertension  This is a chronic problem. The problem is controlled. Pertinent negatives include no chest pain, palpitations or shortness of breath. Past treatments include ACE inhibitors and diuretics. The current treatment provides significant improvement. There is no history of kidney disease, CAD/MI or CVA.  Gastroesophageal Reflux  She complains of heartburn. She reports no abdominal pain or no chest pain. This is a recurrent problem. The problem occurs occasionally. Pertinent negatives include no fatigue. She has tried a PPI for the symptoms. The treatment provided significant relief.   Lab Results  Component Value Date   TSH 1.770 10/02/2016    Review of Systems  Constitutional: Negative for chills, fatigue and fever.  Eyes: Positive for visual disturbance (floaters). Negative for photophobia, pain, redness and itching.  Respiratory: Negative for chest tightness and shortness of breath.   Cardiovascular: Negative for chest pain, palpitations and leg swelling.  Gastrointestinal: Positive for heartburn. Negative for abdominal pain and constipation.  Musculoskeletal: Negative for arthralgias.  Allergic/Immunologic: Negative for environmental allergies.    Patient Active Problem List   Diagnosis Date Noted  . 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 Mixed hyperlipidemia 10/02/2016  . Lower extremity edema 03/27/2016  . Vitamin D deficiency 03/27/2016  . Seasonal allergies 03/27/2016  . Ptosis of eyelid, left 03/27/2016  . Essential hypertension 03/22/2015  . Gastroesophageal reflux disease without esophagitis 03/22/2015  . Hypothyroidism due to acquired atrophy of thyroid 03/22/2015  . S/P right TKA 03/20/2014    Prior to Admission medications   Medication Sig Start Date End Date Taking? Authorizing Provider  Cholecalciferol (VITAMIN D) 2000 units CAPS Take 2,000 Units by  mouth daily.    [provider]  Cyanocobalamin (VITAMIN B 12 PO) Take 1 tablet by mouth daily.    [provider]  docusate sodium (COLACE) 100 MG capsule Take 1 capsule (100 mg total) by mouth 2 (two) times daily. 10/19/16   Danae Orleans, PA-C  enalapril (VASOTEC) 20 MG tablet Take 1 tablet (20 mg total) by mouth 2 (two) times daily. 10/02/16   Glean Hess, MD  ferrous sulfate (FERROUSUL) 325 (65 FE) MG tablet Take 1 tablet (325 mg total) by mouth 3 (three) times daily with meals. 10/19/16   Danae Orleans, PA-C  fluticasone (FLONASE) 50 MCG/ACT nasal spray Place 2 sprays daily into both nostrils. 01/04/17   Glean Hess, MD  hydrochlorothiazide (HYDRODIURIL) 25 MG tablet Take 1 tablet (25 mg total) daily by mouth. 01/04/17   Glean Hess, MD  HYDROcodone-acetaminophen (NORCO) 7.5-325 MG tablet Take 1-2 tablets by mouth every 4 (four) hours as needed for moderate pain or severe pain. 10/19/16   Danae Orleans, PA-C  levothyroxine (SYNTHROID, LEVOTHROID) 25 MCG tablet Take 1 tablet (25 mcg total) by mouth daily before breakfast. 06/03/16   Glean Hess, MD  methocarbamol (ROBAXIN) 500 MG tablet Take 1 tablet (500 mg total) by mouth every 6 (six) hours as needed for muscle spasms. 10/19/16   Danae Orleans, PA-C  omeprazole (PRILOSEC) 20 MG capsule Take 1 capsule (20 mg total) 2 (two) times daily by mouth. 01/04/17   Glean Hess, MD  polyethylene glycol (MIRALAX / Floria Raveling) packet Take 17 g by mouth 2 (two) times daily. 10/19/16   Danae Orleans, PA-C  Pyridoxine HCl (VITAMIN B6 PO) Take 1 tablet by mouth daily.    [provider]    Allergies  Allergen Reactions  . Sulfa Antibiotics Hives  . Tape Other (See Comments) and Rash    Sensitive skin- can use paper tape bandaids- make skin red  Sensitive skin- can use paper tape    Past Surgical History:  Procedure Laterality Date  . BREAST CYST ASPIRATION Left 1988   "it just a fineneedle  aspiration"   . EYE SURGERY  2001,2005   left eye surgery x 2 ( eyelid)   . 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 TOTAL KNEE ARTHROPLASTY Left 10/19/2016   Procedure: LEFT TOTAL KNEE ARTHROPLASTY;  Surgeon: Paralee Cancel, MD;  Location: [facility];  Service: Orthopedics;  Laterality: Left;    Social History   Tobacco Use  . Smoking status: Never Smoker  . Smokeless tobacco: Never Used  Substance Use Topics  . Alcohol use: Yes    Comment: occasional   . Drug use: No     Medication list has been reviewed and updated.  PHQ 2/9 Scores 10/02/2016  PHQ - 2 Score 0    Physical Exam  Constitutional: She is oriented to person, place, and time. She appears well-developed. No distress.  HENT:  Head: Normocephalic and atraumatic.  Pulmonary/Chest: Effort normal. No respiratory distress.  Musculoskeletal: Normal range of motion.  Neurological: She is alert and oriented to person, place, and time.  Skin: Skin is warm and dry. No rash noted.  Psychiatric: She has a normal mood and affect. Her behavior is normal. Thought content normal.  Nursing note and vitals reviewed.   BP 128/78   Pulse 87   Resp 16   Ht 5\' 3"  (1.6 m)   Wt 151 lb 3.2 oz (68.6 kg)   SpO2 100%   BMI 26.78 kg/m   Assessment and Plan: 1. Essential hypertension controlled  2. Hypothyroidism due to acquired atrophy of thyroid supplemented  3. Gastroesophageal reflux disease without esophagitis controlled - omeprazole (PRILOSEC) 20 MG capsule; Take 1 capsule (20 mg total) by mouth 2 (two) times daily.  Dispense: 180 capsule; Refill: 3  4. Vitreous floaters of right eye Pt reassured Follow up with ophthalmology if worsening  Meds ordered this encounter  Medications  . omeprazole (PRILOSEC) 20 MG capsule    Sig: Take 1 capsule (20 mg total) by mouth 2 (two) times daily.     Dispense:  180 capsule    Refill:  3    Partially dictated using Editor, commissioning. Any errors are unintentional.  Halina Maidens, MD [facility]  01/29/2017

## 2017-02-08 ENCOUNTER — Ambulatory Visit: Payer: 59

## 2017-04-12 ENCOUNTER — Other Ambulatory Visit: Payer: Self-pay | Admitting: Internal Medicine

## 2017-04-12 DIAGNOSIS — I10 Essential (primary) hypertension: Secondary | ICD-10-CM

## 2017-07-05 ENCOUNTER — Other Ambulatory Visit: Payer: Self-pay | Admitting: Internal Medicine

## 2017-07-08 ENCOUNTER — Other Ambulatory Visit: Payer: Self-pay

## 2017-07-08 ENCOUNTER — Encounter: Payer: Self-pay | Admitting: Emergency Medicine

## 2017-07-08 ENCOUNTER — Inpatient Hospital Stay
Admission: EM | Admit: 2017-07-08 | Discharge: 2017-07-12 | DRG: 378 | Disposition: A | Discharge status: Home / Self Care | Payer: 59 | Attending: Internal Medicine | Admitting: Internal Medicine

## 2017-07-08 DIAGNOSIS — E039 Hypothyroidism, unspecified: Secondary | ICD-10-CM | POA: Diagnosis not present

## 2017-07-08 DIAGNOSIS — E876 Hypokalemia: Secondary | ICD-10-CM | POA: Diagnosis present

## 2017-07-08 DIAGNOSIS — Z96653 Presence of artificial knee joint, bilateral: Secondary | ICD-10-CM | POA: Diagnosis present

## 2017-07-08 DIAGNOSIS — K922 Gastrointestinal hemorrhage, unspecified: Secondary | ICD-10-CM | POA: Diagnosis not present

## 2017-07-08 DIAGNOSIS — D62 Acute posthemorrhagic anemia: Secondary | ICD-10-CM | POA: Diagnosis present

## 2017-07-08 DIAGNOSIS — K5791 Diverticulosis of intestine, part unspecified, without perforation or abscess with bleeding: Secondary | ICD-10-CM | POA: Diagnosis not present

## 2017-07-08 DIAGNOSIS — R55 Syncope and collapse: Secondary | ICD-10-CM

## 2017-07-08 DIAGNOSIS — M21371 Foot drop, right foot: Secondary | ICD-10-CM | POA: Diagnosis present

## 2017-07-08 DIAGNOSIS — Z79899 Other long term (current) drug therapy: Secondary | ICD-10-CM | POA: Diagnosis not present

## 2017-07-08 DIAGNOSIS — I10 Essential (primary) hypertension: Secondary | ICD-10-CM | POA: Diagnosis present

## 2017-07-08 DIAGNOSIS — E860 Dehydration: Secondary | ICD-10-CM | POA: Diagnosis not present

## 2017-07-08 DIAGNOSIS — K5790 Diverticulosis of intestine, part unspecified, without perforation or abscess without bleeding: Secondary | ICD-10-CM | POA: Diagnosis not present

## 2017-07-08 DIAGNOSIS — K5731 Diverticulosis of large intestine without perforation or abscess with bleeding: Principal | ICD-10-CM | POA: Diagnosis present

## 2017-07-08 DIAGNOSIS — D509 Iron deficiency anemia, unspecified: Secondary | ICD-10-CM | POA: Diagnosis present

## 2017-07-08 DIAGNOSIS — K449 Diaphragmatic hernia without obstruction or gangrene: Secondary | ICD-10-CM | POA: Diagnosis present

## 2017-07-08 DIAGNOSIS — E871 Hypo-osmolality and hyponatremia: Secondary | ICD-10-CM | POA: Diagnosis present

## 2017-07-08 DIAGNOSIS — K921 Melena: Secondary | ICD-10-CM | POA: Diagnosis not present

## 2017-07-08 DIAGNOSIS — K219 Gastro-esophageal reflux disease without esophagitis: Secondary | ICD-10-CM | POA: Diagnosis present

## 2017-07-08 DIAGNOSIS — D649 Anemia, unspecified: Secondary | ICD-10-CM | POA: Diagnosis not present

## 2017-07-08 HISTORY — DX: Gastrointestinal hemorrhage, unspecified: K92.2

## 2017-07-08 LAB — COMPREHENSIVE METABOLIC PANEL
ALT: 14 U/L (ref 14–54)
ANION GAP: 8 (ref 5–15)
AST: 19 U/L (ref 15–41)
Albumin: 3.8 g/dL (ref 3.5–5.0)
Alkaline Phosphatase: 48 U/L (ref 38–126)
BUN: 29 mg/dL — ABNORMAL HIGH (ref 6–20)
CHLORIDE: 97 mmol/L — AB (ref 101–111)
CO2: 25 mmol/L (ref 22–32)
Calcium: 8.3 mg/dL — ABNORMAL LOW (ref 8.9–10.3)
Creatinine, Ser: 0.73 mg/dL (ref 0.44–1.00)
Glucose, Bld: 123 mg/dL — ABNORMAL HIGH (ref 65–99)
POTASSIUM: 3.3 mmol/L — AB (ref 3.5–5.1)
Sodium: 130 mmol/L — ABNORMAL LOW (ref 135–145)
Total Bilirubin: 0.7 mg/dL (ref 0.3–1.2)
Total Protein: 5.9 g/dL — ABNORMAL LOW (ref 6.5–8.1)

## 2017-07-08 LAB — HEMOGLOBIN: Hemoglobin: 9.2 g/dL — ABNORMAL LOW (ref 12.0–16.0)

## 2017-07-08 LAB — CBC
HEMATOCRIT: 29.6 % — AB (ref 35.0–47.0)
HEMOGLOBIN: 10.2 g/dL — AB (ref 12.0–16.0)
MCH: 30.9 pg (ref 26.0–34.0)
MCHC: 34.5 g/dL (ref 32.0–36.0)
MCV: 89.7 fL (ref 80.0–100.0)
PLATELETS: 296 10*3/uL (ref 150–440)
RBC: 3.3 MIL/uL — AB (ref 3.80–5.20)
RDW: 13.7 % (ref 11.5–14.5)
WBC: 9.2 10*3/uL (ref 3.6–11.0)

## 2017-07-08 LAB — MAGNESIUM: Magnesium: 1.9 mg/dL (ref 1.7–2.4)

## 2017-07-08 MED ORDER — SODIUM CHLORIDE 0.9 % IV SOLN
INTRAVENOUS | Status: DC
  Administered 2017-07-08 – 2017-07-11 (×5): via INTRAVENOUS

## 2017-07-08 MED ORDER — ALBUTEROL SULFATE (2.5 MG/3ML) 0.083% IN NEBU: 2.5000 mg | INHALATION_SOLUTION | RESPIRATORY_TRACT | Status: DC | PRN

## 2017-07-08 MED ORDER — HYDROCODONE-ACETAMINOPHEN 5-325 MG PO TABS: 1.0000 | ORAL_TABLET | ORAL | Status: DC | PRN

## 2017-07-08 MED ORDER — ACETAMINOPHEN 650 MG RE SUPP
650.0000 mg | Freq: Four times a day (QID) | RECTAL | Status: DC | PRN
Start: 2017-07-08 — End: 2017-07-12

## 2017-07-08 MED ORDER — ONDANSETRON HCL 4 MG/2ML IJ SOLN: 4.0000 mg | Freq: Four times a day (QID) | INTRAMUSCULAR | Status: DC | PRN

## 2017-07-08 MED ORDER — FAMOTIDINE IN NACL 20-0.9 MG/50ML-% IV SOLN
20.0000 mg | Freq: Two times a day (BID) | INTRAVENOUS | Status: DC
  Administered 2017-07-08 – 2017-07-09 (×3): 20 mg via INTRAVENOUS
  Filled 2017-07-08 (×4): qty 50

## 2017-07-08 MED ORDER — LEVOTHYROXINE SODIUM 25 MCG PO TABS
25.0000 ug | ORAL_TABLET | Freq: Every day | ORAL | Status: DC
  Administered 2017-07-09 – 2017-07-11 (×2): 25 ug via ORAL
  Filled 2017-07-08 (×2): qty 1

## 2017-07-08 MED ORDER — ENALAPRIL MALEATE 20 MG PO TABS
20.0000 mg | ORAL_TABLET | Freq: Two times a day (BID) | ORAL | Status: DC
  Administered 2017-07-09: 20 mg via ORAL
  Filled 2017-07-08 (×5): qty 1

## 2017-07-08 MED ORDER — FLUTICASONE PROPIONATE 50 MCG/ACT NA SUSP
2.0000 | Freq: Every day | NASAL | Status: DC
  Administered 2017-07-09 – 2017-07-11 (×2): 2 via NASAL
  Filled 2017-07-08: qty 16

## 2017-07-08 MED ORDER — SODIUM CHLORIDE 0.9 % IV BOLUS
1000.0000 mL | Freq: Once | INTRAVENOUS | Status: AC
  Administered 2017-07-08: 1000 mL via INTRAVENOUS

## 2017-07-08 MED ORDER — ONDANSETRON HCL 4 MG PO TABS: 4.0000 mg | ORAL_TABLET | Freq: Four times a day (QID) | ORAL | Status: DC | PRN

## 2017-07-08 MED ORDER — ACETAMINOPHEN 325 MG PO TABS: 650.0000 mg | ORAL_TABLET | Freq: Four times a day (QID) | ORAL | Status: DC | PRN

## 2017-07-08 MED ORDER — POTASSIUM CHLORIDE CRYS ER 20 MEQ PO TBCR
40.0000 meq | EXTENDED_RELEASE_TABLET | Freq: Once | ORAL | Status: AC
  Administered 2017-07-08: 40 meq via ORAL
  Filled 2017-07-08: qty 2

## 2017-07-08 NOTE — ED Notes (Signed)
Report given to floor. Floor will take pt after protected time at [place]

## 2017-07-08 NOTE — ED Provider Notes (Signed)
[facility] Emergency Department Provider Note  ___________________________________________   First MD Initiated Contact with Patient 07/08/17 1648     (approximate)  I have reviewed the triage vital signs and the nursing notes.   HISTORY  Chief Complaint Rectal Bleeding   HPI Julie Castillo is a 62 y.o. female who was presented to the emergency department 2 days of GI bleeding.  She says that she has had one episode of mixed bright and maroon blood yesterday and then 3 episodes today.  After third episode today the patient said that she was in a store when she became lightheaded and felt like she was going to pass out.  She is denying any chest or abdominal pain.  Says that she had a colonoscopy last 10 years ago which was "normal."  No history of diverticulosis.  Patient does not take any blood thinners.  Patient feeling generally weak right now but not lightheaded or presyncopal.   Past Medical History:  Diagnosis Date  . Arthritis   . Edema    lower extremities after standing a long time   . Family history of adverse reaction to anesthesia    father- post op became violent   . Foot drop, right 2016   developed foot drop following RTKA in 2016; had to wear foot drop shoe following discharge ; per pat "i had pain in my ankle real bad but it ended up being dx as foot drop"  . GERD (gastroesophageal reflux disease)   . Hypertension   . Hypothyroidism   . Pneumonia    hx of   . PONV (postoperative nausea and vomiting)    post op day 1   . Varicose veins     Patient Active Problem List   Diagnosis Date Noted  . Vitreous floaters of right eye 01/29/2017  . 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 Mixed hyperlipidemia 10/02/2016  . Lower extremity edema 03/27/2016  . Vitamin D deficiency 03/27/2016  . Seasonal allergies 03/27/2016  . Ptosis of eyelid, left 03/27/2016  . Essential hypertension 03/22/2015  . Gastroesophageal reflux disease without esophagitis  03/22/2015  . Hypothyroidism due to acquired atrophy of thyroid 03/22/2015  . S/P right TKA 03/20/2014    Past Surgical History:  Procedure Laterality Date  . BREAST CYST ASPIRATION Left 1988   "it just a fineneedle aspiration"   . EYE SURGERY  2001,2005   left eye surgery x 2 ( eyelid)   . 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 TOTAL KNEE ARTHROPLASTY Left 10/19/2016   Procedure: LEFT TOTAL KNEE ARTHROPLASTY;  Surgeon: Paralee Cancel, MD;  Location: [facility];  Service: Orthopedics;  Laterality: Left;    Prior to Admission medications   Medication Sig Start Date End Date Taking? Authorizing Provider  Cholecalciferol (VITAMIN D) 2000 units CAPS Take 2,000 Units by mouth daily.    [provider]  Cyanocobalamin (VITAMIN B 12 PO) Take 1 tablet by mouth daily.    [provider]  enalapril (VASOTEC) 20 MG tablet TAKE 1 TABLET BY MOUTH 2 TIMES DAILY. 04/12/17   Glean Hess, MD  ferrous sulfate (FERROUSUL) 325 (65 FE) MG tablet Take 1 tablet (325 mg total) by mouth 3 (three) times daily with meals. 10/19/16   Danae Orleans, PA-C  fluticasone (FLONASE) 50 MCG/ACT nasal spray PLACE 2 SPRAYS DAILY INTO BOTH NOSTRILS.  07/05/17   Glean Hess, MD  hydrochlorothiazide (HYDRODIURIL) 25 MG tablet TAKE 1 TABLET DAILY BY MOUTH. 04/12/17   Glean Hess, MD  levothyroxine (SYNTHROID, LEVOTHROID) 25 MCG tablet TAKE 1 TABLET (25 MCG TOTAL) BY MOUTH DAILY BEFORE BREAKFAST. 07/05/17   Glean Hess, MD  omeprazole (PRILOSEC) 20 MG capsule Take 1 capsule (20 mg total) by mouth 2 (two) times daily. 01/29/17   Glean Hess, MD  Pyridoxine HCl (VITAMIN B6 PO) Take 1 tablet by mouth daily.    [provider]    Allergies Sulfa antibiotics and Tape  Family History  Problem Relation Age of Onset  . Breast cancer Mother 64  .  Breast cancer Paternal Aunt 67  . Breast cancer Cousin 35       BRCA Negative    Social History Social History   Tobacco Use  . Smoking status: Never Smoker  . Smokeless tobacco: Never Used  Substance Use Topics  . Alcohol use: Yes    Comment: occasional   . Drug use: No    Review of Systems  Constitutional: No fever/chills Eyes: No visual changes. ENT: No sore throat. Cardiovascular: Denies chest pain. Respiratory: Denies shortness of breath. Gastrointestinal: No abdominal pain.  No nausea, no vomiting.  No diarrhea.  No constipation. Genitourinary: Negative for dysuria. Musculoskeletal: Negative for back pain. Skin: Negative for rash. Neurological: Negative for headaches, focal weakness or numbness.   ____________________________________________   PHYSICAL EXAM:  VITAL SIGNS: ED Triage Vitals  Enc Vitals Group     BP 07/08/17 1615 (!) 95/54     Pulse Rate 07/08/17 1615 86     Resp 07/08/17 1615 16     Temp 07/08/17 1615 97.8 F (36.6 C)     Temp Source 07/08/17 1615 Oral     SpO2 07/08/17 1615 98 %     Weight 07/08/17 1615 151 lb (68.5 kg)     Height 07/08/17 1733 5' 3"  (1.6 m)     Head Circumference --      Peak Flow --      Pain Score 07/08/17 1615 0     Pain Loc --      Pain Edu? --      Excl. in [place]? --     Constitutional: Alert and oriented. Well appearing and in no acute distress. Eyes: Conjunctivae are normal.  Head: Atraumatic. Nose: No congestion/rhinnorhea. Mouth/Throat: Mucous membranes are moist.  Neck: No stridor.   Cardiovascular: Normal rate, regular rhythm. Grossly normal heart sounds.  Good peripheral circulation. Respiratory: Normal respiratory effort.  No retractions. Lungs CTAB. Gastrointestinal: Soft and nontender. No distention.  Digital rectal exam with hematochezia. Musculoskeletal: No lower extremity tenderness nor edema.  No joint effusions. Neurologic:  Normal speech and language. No gross focal neurologic deficits are  appreciated. Skin:  Skin is warm, dry and intact. No rash noted. Psychiatric: Mood and affect are normal. Speech and behavior are normal.  ____________________________________________   LABS (all labs ordered are listed, but only abnormal results are displayed)  Labs Reviewed  COMPREHENSIVE METABOLIC PANEL - Abnormal; Notable for the following components:      Result Value   Sodium 130 (*)    Potassium 3.3 (*)    Chloride 97 (*)    Glucose, Bld 123 (*)    BUN 29 (*)    Calcium 8.3 (*)    Total Protein 5.9 (*)    All other components within normal limits  CBC - Abnormal; Notable for  the following components:   RBC 3.30 (*)    Hemoglobin 10.2 (*)    HCT 29.6 (*)    All other components within normal limits  POC OCCULT BLOOD, ED  TYPE AND SCREEN   ____________________________________________  EKG   ____________________________________________  RADIOLOGY   ____________________________________________   PROCEDURES  Procedure(s) performed:   Procedures  Critical Care performed:   ____________________________________________   INITIAL IMPRESSION / ASSESSMENT AND PLAN / ED COURSE  Pertinent labs & imaging results that were available during my care of the patient were reviewed by me and considered in my medical decision making (see chart for details).  DDX: Hemorrhoids, diverticulosis, bleeding gastric ulcer, anemia, near-syncope As part of my medical decision making, I reviewed the following data within the electronic MEDICAL RECORD NUMBER Notes from prior ED visits  ----------------------------------------- 5:53 PM on 07/08/2017 -----------------------------------------  Patient becoming symptomatic after multiple episodes ofred blood per rectum.  To be admitted to the hospital.  Patient understanding of the diagnosis as well as treatment plan willing to comply but signed out to Dr. Bridgett Larsson.  ____________________________________________   FINAL CLINICAL  IMPRESSION(S) / ED DIAGNOSES  GI bleed.  Near syncope.    NEW MEDICATIONS STARTED DURING THIS VISIT:  New Prescriptions   No medications on file     Note:  This document was prepared using Dragon voice recognition software and may include unintentional dictation errors.     Orbie Pyo, MD 07/08/17 [phone]

## 2017-07-08 NOTE — ED Triage Notes (Signed)
Pt to ED via POV. Pt states that she has been having intermittent periods of rectal bleeding since yesterday. Pt states that she is passing large amounts of blood. Pt denies hx/o rectal bleeding in the past. Pt states that she called her PCP but was unable to get in until tomorrow. Pt states that her blood pressure has been low since having the rectal bleeding.

## 2017-07-08 NOTE — ED Notes (Signed)
Pt to tht er for rectal bleeding. Pt states yesterday she had stomach rumbling and then had rectal bleeding. Pt says it happened again today x 3 episodes. Pt describes as bright red and dark. Pt reports like diarrhea yesterday. Pt reports stool in with blood. Pt had an episode of dizziness and sat down on the ground. No LOC. Pt says she has been taking an excessive amount of IBU over the last month due to various reasons.

## 2017-07-08 NOTE — H&P (Signed)
[place] [facility] [place] NAME: Julie Castillo    MR#:  [id number]  DATE OF BIRTH:  [birth year]  DATE OF ADMISSION:  07/08/2017  PRIMARY CARE PHYSICIAN: Glean Hess, MD   REQUESTING/REFERRING PHYSICIAN: Dr. Clearnce Hasten.  CHIEF COMPLAINT:   Chief Complaint  Patient presents with  . Rectal Bleeding   Bloody stool for 2 days. HISTORY OF PRESENT ILLNESS:  Julie Castillo  is a 62 y.o. female with a known history of multiple medical problems as below.  The patient presents the ED with above chief complaints.  She had one episode of mixed bright maroon bloody stool yesterday and a 3 episode of bloody stool today.  She feels dizzy and weak, almost passed out.  She denies any chest pain, shortness of breath or abdominal pain.  She had a colonoscopy 10 years ago which was normal.  She is not taking any blood thinner. PAST MEDICAL HISTORY:   Past Medical History:  Diagnosis Date  . Arthritis   . Edema    lower extremities after standing a long time   . Family history of adverse reaction to anesthesia    father- post op became violent   . Foot drop, right 2016   developed foot drop following RTKA in 2016; had to wear foot drop shoe following discharge ; per pat "i had pain in my ankle real bad but it ended up being dx as foot drop"  . GERD (gastroesophageal reflux disease)   . Hypertension   . Hypothyroidism   . Pneumonia    hx of   . PONV (postoperative nausea and vomiting)    post op day 1   . Varicose veins     PAST SURGICAL HISTORY:   Past Surgical History:  Procedure Laterality Date  . BREAST CYST ASPIRATION Left 1988   "it just a fineneedle aspiration"   . EYE SURGERY  2001,2005   left eye surgery x 2 ( eyelid)   . 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 TOTAL KNEE ARTHROPLASTY Left 10/19/2016   Procedure: LEFT TOTAL KNEE ARTHROPLASTY;  Surgeon: Paralee Cancel, MD;  Location: [facility];  Service: Orthopedics;  Laterality: Left;    SOCIAL HISTORY:   Social History   Tobacco Use  . Smoking status: Never Smoker  . Smokeless tobacco: Never Used  Substance Use Topics  . Alcohol use: Yes    Comment: occasional     FAMILY HISTORY:   Family History  Problem Relation Age of Onset  . Breast cancer Mother 90  . Breast cancer Paternal Aunt 27  . Breast cancer Cousin 35       BRCA Negative    DRUG ALLERGIES:   Allergies  Allergen Reactions  . Sulfa Antibiotics Hives  . Tape Other (See Comments) and Rash    Sensitive skin- can use paper tape bandaids- make skin red  Sensitive skin- can use paper tape    REVIEW OF SYSTEMS:   Review of Systems  Constitutional: Positive for malaise/fatigue. Negative for chills and fever.  HENT: Negative for sore throat.   Eyes: Negative for blurred vision and double vision.  Respiratory: Negative for cough, hemoptysis, shortness of breath, wheezing and stridor.   Cardiovascular: Negative for chest pain, palpitations, orthopnea and leg swelling.  Gastrointestinal: Negative for abdominal pain, blood in stool, diarrhea, melena, nausea  and vomiting.  Genitourinary: Negative for dysuria, flank pain and hematuria.  Musculoskeletal: Negative for back pain and joint pain.  Neurological: Positive for dizziness. Negative for sensory change, focal weakness, seizures, loss of consciousness, weakness and headaches.  Endo/Heme/Allergies: Negative for polydipsia.  Psychiatric/Behavioral: Negative for depression. The patient is not nervous/anxious.     MEDICATIONS AT HOME:   Prior to Admission medications   Medication Sig Start Date End Date Taking? Authorizing Provider  Cholecalciferol (VITAMIN D) 2000 units CAPS Take 2,000 Units by mouth daily.   Yes [provider]  Cyanocobalamin (VITAMIN B 12 PO) Take 1 tablet by mouth daily.   Yes  [provider]  enalapril (VASOTEC) 20 MG tablet TAKE 1 TABLET BY MOUTH 2 TIMES DAILY. 04/12/17  Yes Glean Hess, MD  fluticasone (FLONASE) 50 MCG/ACT nasal spray PLACE 2 SPRAYS DAILY INTO BOTH NOSTRILS. 07/05/17  Yes Glean Hess, MD  hydrochlorothiazide (HYDRODIURIL) 25 MG tablet TAKE 1 TABLET DAILY BY MOUTH. 04/12/17  Yes Glean Hess, MD  levothyroxine (SYNTHROID, LEVOTHROID) 25 MCG tablet TAKE 1 TABLET (25 MCG TOTAL) BY MOUTH DAILY BEFORE BREAKFAST. 07/05/17  Yes Glean Hess, MD  omeprazole (PRILOSEC) 20 MG capsule Take 1 capsule (20 mg total) by mouth 2 (two) times daily. 01/29/17  Yes Glean Hess, MD  Pyridoxine HCl (VITAMIN B6 PO) Take 1 tablet by mouth daily.   Yes [provider]      VITAL SIGNS:  Blood pressure 135/80, pulse 84, temperature 98.1 F (36.7 C), temperature source Oral, resp. rate 16, height _0  (1.6 m), weight 158 lb (71.7 kg), SpO2 100 %.  PHYSICAL EXAMINATION:  Physical Exam  GENERAL:  62 y.o.-year-old patient lying in the bed with no acute distress.  EYES: Pupils equal, round, reactive to light and accommodation. No scleral icterus. Extraocular muscles intact.  HEENT: Head atraumatic, normocephalic. Oropharynx and nasopharynx clear.  NECK:  Supple, no jugular venous distention. No thyroid enlargement, no tenderness.  LUNGS: Normal breath sounds bilaterally, no wheezing, rales,rhonchi or crepitation. No use of accessory muscles of respiration.  CARDIOVASCULAR: S1, S2 normal. No murmurs, rubs, or gallops.  ABDOMEN: Soft, nontender, nondistended. Bowel sounds present. No organomegaly or mass.  EXTREMITIES: No pedal edema, cyanosis, or clubbing.  NEUROLOGIC: Cranial nerves II through XII are intact. Muscle strength 5/5 in all extremities. Sensation intact. Gait not checked.  PSYCHIATRIC: The patient is alert and oriented x 3.  SKIN: No obvious rash, lesion, or ulcer.   LABORATORY PANEL:   CBC Recent Labs  Lab  07/08/17 1623  WBC 9.2  HGB 10.2*  HCT 29.6*  PLT 296   ------------------------------------------------------------------------------------------------------------------  Chemistries  Recent Labs  Lab 07/08/17 1623  NA 130*  K 3.3*  CL 97*  CO2 25  GLUCOSE 123*  BUN 29*  CREATININE 0.73  CALCIUM 8.3*  AST 19  ALT 14  ALKPHOS 48  BILITOT 0.7   ------------------------------------------------------------------------------------------------------------------  Cardiac Enzymes No results for input(s): TROPONINI in the last 168 hours. ------------------------------------------------------------------------------------------------------------------  RADIOLOGY:  No results found.    IMPRESSION AND PLAN:   GI bleeding. The patient will be admitted to medical floor. N.p.o. except meds and sips of water, Protonix IV twice daily, GI consult.  Symptomatic anemia. Check hemoglobin every 6 hours.  PRBC transfusion as needed.  Dehydration and hyponatremia.  Normal saline IV and follow-up BMP.  Hold HCTZ.  Hypokalemia.  Give potassium and follow-up level. Hold HCTZ.  Hypertension.  Continue home hypertension medication.   All  the records are reviewed and case discussed with ED provider. Management plans discussed with the patient, her husband and they are in agreement.  CODE STATUS: Full code  TOTAL TIME TAKING CARE OF THIS PATIENT: 53 minutes.    Demetrios Loll M.D on 07/08/2017 at 8:03 PM  Between 7am to 6pm - Pager - [phone]  After 6pm go to [email or web address] - Proofreader  [facility]  Office  [phone]  CC: Primary care physician; Glean Hess, MD   Note: This dictation was prepared with Dragon dictation along with smaller phrase technology. Any transcriptional errors that result from this process are unin

## 2017-07-08 NOTE — ED Notes (Signed)
ED Provider at bedside. 

## 2017-07-09 ENCOUNTER — Ambulatory Visit: Payer: 59 | Admitting: Internal Medicine

## 2017-07-09 LAB — CBC
HCT: 24.2 % — ABNORMAL LOW (ref 35.0–47.0)
HEMOGLOBIN: 8.4 g/dL — AB (ref 12.0–16.0)
MCH: 31 pg (ref 26.0–34.0)
MCHC: 34.6 g/dL (ref 32.0–36.0)
MCV: 89.5 fL (ref 80.0–100.0)
PLATELETS: 228 10*3/uL (ref 150–440)
RBC: 2.7 MIL/uL — AB (ref 3.80–5.20)
RDW: 13.6 % (ref 11.5–14.5)
WBC: 5.6 10*3/uL (ref 3.6–11.0)

## 2017-07-09 LAB — GLUCOSE, CAPILLARY: GLUCOSE-CAPILLARY: 110 mg/dL — AB (ref 65–99)

## 2017-07-09 LAB — BASIC METABOLIC PANEL
Anion gap: 6 (ref 5–15)
BUN: 21 mg/dL — AB (ref 6–20)
CO2: 25 mmol/L (ref 22–32)
CREATININE: 0.48 mg/dL (ref 0.44–1.00)
Calcium: 8.1 mg/dL — ABNORMAL LOW (ref 8.9–10.3)
Chloride: 104 mmol/L (ref 101–111)
GFR calc Af Amer: >60 mL/min (ref >60)
GLUCOSE: 102 mg/dL — AB (ref 65–99)
Potassium: 3.7 mmol/L (ref 3.5–5.1)
Sodium: 135 mmol/L (ref 135–145)

## 2017-07-09 LAB — OCCULT BLOOD X 1 CARD TO LAB, STOOL: FECAL OCCULT BLD: POSITIVE — AB

## 2017-07-09 LAB — HEMOGLOBIN
Hemoglobin: 8.7 g/dL — ABNORMAL LOW (ref 12.0–16.0)
Hemoglobin: 9 g/dL — ABNORMAL LOW (ref 12.0–16.0)

## 2017-07-09 MED ORDER — PEG 3350-KCL-NA BICARB-NACL 420 G PO SOLR
4000.0000 mL | Freq: Once | ORAL | Status: AC
  Administered 2017-07-09: 4000 mL via ORAL
  Filled 2017-07-09: qty 4000

## 2017-07-09 MED ORDER — FAMOTIDINE IN NACL 20-0.9 MG/50ML-% IV SOLN
20.0000 mg | Freq: Two times a day (BID) | INTRAVENOUS | Status: DC
  Administered 2017-07-09 – 2017-07-11 (×3): 20 mg via INTRAVENOUS
  Filled 2017-07-09 (×2): qty 50

## 2017-07-09 MED ORDER — PANTOPRAZOLE SODIUM 40 MG PO TBEC
40.0000 mg | DELAYED_RELEASE_TABLET | Freq: Two times a day (BID) | ORAL | Status: DC
  Administered 2017-07-09: 40 mg via ORAL
  Filled 2017-07-09: qty 1

## 2017-07-09 NOTE — Progress Notes (Signed)
Patient ID: Julie Castillo, female   DOB: [birth year], 62 y.o.   MRN: [id number]  [facility] PROGRESS NOTE  Julie Castillo PFX:[id number] DOB: 10-Jan-1956 DOA: 07/08/2017 PCP: Glean Hess, MD  HPI/Subjective: Patient feels okay.  On Wednesday she had an episode of rectal bleeding.  Yesterday had 2 episodes of rectal bleeding then felt weak and woozy and fell over but no loss of consciousness.  Had another episode in the emergency room of rectal bleeding.  Patient states that she had an immunization was taking a lot of ibuprofen.  Objective: Vitals:   07/09/17 0446 07/09/17 1210  BP: 129/83 (!) 152/89  Pulse: 79 71  Resp: 18 18  Temp: 98.2 F (36.8 C) (!) 97.5 F (36.4 C)  SpO2: 100% 100%    Filed Weights   07/08/17 1615 07/08/17 1733  Weight: 68.5 kg (151 lb) 71.7 kg (158 lb)    ROS: Review of Systems  Constitutional: Negative for chills and fever.  Eyes: Negative for blurred vision.  Respiratory: Negative for cough and shortness of breath.   Cardiovascular: Negative for chest pain.  Gastrointestinal: Positive for blood in stool and diarrhea. Negative for abdominal pain, constipation, nausea and vomiting.  Genitourinary: Negative for dysuria.  Musculoskeletal: Negative for joint pain.  Neurological: Negative for dizziness and headaches.   Exam: Physical Exam  Constitutional: She is oriented to person, place, and time.  HENT:  Nose: No mucosal edema.  Mouth/Throat: No oropharyngeal exudate or posterior oropharyngeal edema.  Eyes: Pupils are equal, round, and reactive to light. Conjunctivae, EOM and lids are normal.  Neck: No JVD present. Carotid bruit is not present. No edema present. No thyroid mass and no thyromegaly present.  Cardiovascular: S1 normal and S2 normal. Exam reveals no gallop.  No murmur heard. Pulses:      Dorsalis pedis pulses are 2+ on the right side, and 2+ on the left side.  Respiratory: No respiratory distress. She has no wheezes. She has no  rhonchi. She has no rales.  GI: Soft. Bowel sounds are normal. There is no tenderness.  Musculoskeletal:       Right ankle: She exhibits no swelling.       Left ankle: She exhibits no swelling.  Lymphadenopathy:    She has no cervical adenopathy.  Neurological: She is alert and oriented to person, place, and time. No cranial nerve deficit.  Skin: Skin is warm. No rash noted. Nails show no clubbing.  Psychiatric: She has a normal mood and affect.      Data Reviewed: Basic Metabolic Panel: Recent Labs  Lab 07/08/17 1623 07/08/17 2237 07/09/17 0400  NA 130*  --  135  K 3.3*  --  3.7  CL 97*  --  104  CO2 25  --  25  GLUCOSE 123*  --  102*  BUN 29*  --  21*  CREATININE 0.73  --  0.48  CALCIUM 8.3*  --  8.1*  MG  --  1.9  --    Liver Function Tests: Recent Labs  Lab 07/08/17 1623  AST 19  ALT 14  ALKPHOS 48  BILITOT 0.7  PROT 5.9*  ALBUMIN 3.8   CBC: Recent Labs  Lab 07/08/17 1623 07/08/17 2237 07/09/17 0400 07/09/17 0958  WBC 9.2  --   --   --   HGB 10.2* 9.2* 8.7* 9.0*  HCT 29.6*  --   --   --   MCV 89.7  --   --   --  PLT 296  --   --   --     Scheduled Meds: . enalapril  20 mg Oral BID  . fluticasone  2 spray Each Nare Daily  . levothyroxine  25 mcg Oral QAC breakfast  . pantoprazole  40 mg Oral BID  . polyethylene glycol-electrolytes  4,000 mL Oral Once   Continuous Infusions: . sodium chloride 40 mL/hr at 07/09/17 1350  . famotidine (PEPCID) IV Stopped (07/09/17 3833)    Assessment/Plan:  1. Acute blood loss anemia with GI bleed.  Unclear if this is upper or lower since the patient did not describe burgundy type bowel movement but also bright red blood per rectum.  Patient to have an endoscopy and colonoscopy tomorrow.  Start Protonix.  Serial hemoglobins.  Last hemoglobin 9.0. 2. Essential hypertension on enalapril 3. Hypothyroidism unspecified on levothyroxine 4. History of GERD.  On Protonix  Code Status:     Code Status Orders   (From admission, onward)        Start     Ordered   07/08/17 2023  Full code  Continuous     07/08/17 2022    Code Status History    Date Active Date Inactive Code Status Order ID Comments User Context   10/19/2016 1014 10/20/2016 1855 Full Code [id number]  Norman Herrlich Inpatient   03/20/2014 1056 03/22/2014 1722 Full Code [id number]  BabishLucille Passy, PA-C Inpatient     HistoryDisposition Plan: To be determined  Time spent: 25 minutes  [place]

## 2017-07-09 NOTE — Significant Event (Addendum)
Rapid Response Event Note  Overview: Time Called: 1838 Arrival Time: 7035 Event Type: Other (Comment)(altered mental status on toliet)  Initial Focused Assessment: Rapid response RN arrived in patient's room and patient's RN, NT, AC, and other RNs from [facility] in patient's room wheeling patient from bathroom to bed. Toilet visible in bathroom with blood in it. Per patient's RN, patient became dizzy, broke out in cold sweat, and became difficult to arouse on toilet while having bowel movement. Patient admitted for GI bleed, plan for EGD and colonscopy tomorrow. Patient had been drinking GoLytely prep and had already drank half of the bottle that had to completed by 05:00 07/10/17. By the time Rapid response RN arrived, patient alert and oriented, transferred self with assistance from 1 staff member to bed with no difficulty, skin dry, appropriate for ethnicity, and warm. Patient reports no dizziness, pain,  or shortness of breath, only complaint that she felt hot. Vital signs: BP 105/70, MAP 81, HR 80 and regular, oxygen saturation 100% on room air. Patient had not received any blood this admission but had already been typed and screened. Last hemoglobin check was at 10:00 and was 9.0, lowest hemoglobin this admission was 8.8.  Interventions: Ordered stat CBC.  Plan of Care (if not transferred): Patient to remain on [place] for now. Patient's RN will communicate any abnormal CBC results to MD on call. Patient instructed to slow down drinkling the GoLytely. Plan for future toileting to be supervised with bedside commode instead of walk to bathroom. Patient and RN instructed to repage rapid response if patient has any instability or future syncopal events.  Event Summary:   at      at    Outcome: Stayed in room and stabalized  Event End Time: [place], [place]

## 2017-07-09 NOTE — Consult Note (Signed)
GI Inpatient Consult Note  Reason for Consult: GI Bleed   Attending Requesting Consult: Dr. Leslye Peer  History of Present Illness: Julie Castillo is a 62 y.o. female seen for evaluation of suspected GI bleed/hematochezia at the request of Dr. Leslye Peer. Pt reports that she was having normal, formed BMs until Wednesday afternoon she noticed one episode of mixed bright red and maroon blood on the toilet paper and in the bowl covering the stool. She reports 3 BMs yesterday with hematochezia and endorses a near syncopal episode and dizziness which prompted her to come to the ED. Hemoccult + in ED. Reports last colonoscopy was 10 years ago, normal per pt. No prior history of hemorrhoids or diverticulosis. Pt not on blood thinners or anticoagulation. Hemoglobin on admission was 10.2, 9.2 last night, and 8.7 this morning.  Patient seen and examined this morning resting comfortably in hospital bed. She reports no BMs since admission. Pt has remained NPO since a tuna fish sandwich for lunch yesterday. No episodes of hematochezia or melena since admission. No prior history of rectal bleeding or anemia. Patient states she feels better than yesterday and less fatigued. Denies fever, nausea, vomiting, dysphagia, abdominal pain. No frequent NSAID use. Pt takes omeprazole 20 mg bid at home and this works well to prevent GERD exacerbations.   Last Colonoscopy: 10 years ago, report n/a via EMR Last Endoscopy: Never performed   Past Medical History:  Past Medical History:  Diagnosis Date  . Arthritis   . Edema    lower extremities after standing a long time   . Family history of adverse reaction to anesthesia    father- post op became violent   . Foot drop, right 2016   developed foot drop following RTKA in 2016; had to wear foot drop shoe following discharge ; per pat "i had pain in my ankle real bad but it ended up being dx as foot drop"  . GERD (gastroesophageal reflux disease)   . Hypertension   .  Hypothyroidism   . Pneumonia    hx of   . PONV (postoperative nausea and vomiting)    post op day 1   . Varicose veins     Problem List: Patient Active Problem List   Diagnosis Date Noted  . GIB (gastrointestinal bleeding) 07/08/2017  . Vitreous floaters of right eye 01/29/2017  . 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 Mixed hyperlipidemia 10/02/2016  . Lower extremity edema 03/27/2016  . Vitamin D deficiency 03/27/2016  . Seasonal allergies 03/27/2016  . Ptosis of eyelid, left 03/27/2016  . Essential hypertension 03/22/2015  . Gastroesophageal reflux disease without esophagitis 03/22/2015  . Hypothyroidism due to acquired atrophy of thyroid 03/22/2015  . 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 BREAST CYST ASPIRATION Left 1988   "it just a fineneedle aspiration"   . EYE SURGERY  2001,2005   left eye surgery x 2 ( eyelid)   . 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 TOTAL KNEE ARTHROPLASTY Left 10/19/2016   Procedure: LEFT TOTAL KNEE ARTHROPLASTY;  Surgeon: Paralee Cancel, MD;  Location: [facility];  Service: Orthopedics;  Laterality: Left;    Allergies: Allergies  Allergen Reactions  . Sulfa Antibiotics Hives  . Tape Other (See Comments) and Rash    Sensitive skin- can use paper tape bandaids-  make skin red  Sensitive skin- can use paper tape    Home Medications: Medications Prior to Admission  Medication Sig Dispense Refill Last Dose  . Cholecalciferol (VITAMIN D) 2000 units CAPS Take 2,000 Units by mouth daily.   07/08/2017 at Unknown time  . Cyanocobalamin (VITAMIN B 12 PO) Take 1 tablet by mouth daily.   07/08/2017 at Unknown time  . enalapril (VASOTEC) 20 MG tablet TAKE 1 TABLET BY MOUTH 2 TIMES DAILY. 180 tablet 3 07/08/2017 at Unknown time  . fluticasone (FLONASE) 50 MCG/ACT nasal  spray PLACE 2 SPRAYS DAILY INTO BOTH NOSTRILS. 16 g 2 07/08/2017 at Unknown time  . hydrochlorothiazide (HYDRODIURIL) 25 MG tablet TAKE 1 TABLET DAILY BY MOUTH. 90 tablet 3 07/08/2017 at Unknown time  . levothyroxine (SYNTHROID, LEVOTHROID) 25 MCG tablet TAKE 1 TABLET (25 MCG TOTAL) BY MOUTH DAILY BEFORE BREAKFAST. 90 tablet 3 07/08/2017 at Unknown time  . omeprazole (PRILOSEC) 20 MG capsule Take 1 capsule (20 mg total) by mouth 2 (two) times daily. 180 capsule 3 07/08/2017 at Unknown time  . Pyridoxine HCl (VITAMIN B6 PO) Take 1 tablet by mouth daily.   07/08/2017 at Unknown time   Home medication reconciliation was completed with the patient.   Scheduled Inpatient Medications:   . enalapril  20 mg Oral BID  . fluticasone  2 spray Each Nare Daily  . levothyroxine  25 mcg Oral QAC breakfast    Continuous Inpatient Infusions:   . sodium chloride 50 mL/hr at 07/09/17 0746  . famotidine (PEPCID) IV Stopped (07/09/17 0958)    PRN Inpatient Medications:  acetaminophen **OR** acetaminophen, albuterol, HYDROcodone-acetaminophen, ondansetron **OR** ondansetron (ZOFRAN) IV  Family History: family history includes Breast cancer (age of onset: 67) in her cousin; Breast cancer (age of onset: 86) in her paternal aunt; Breast cancer (age of onset: 49) in her mother.  The patient's family history is negative for inflammatory bowel disorders, GI malignancy, or solid organ transplantation.  Social History:   reports that she has never smoked. She has never used smokeless tobacco. She reports that she drinks alcohol. She reports that she does not use drugs. The patient denies ETOH, tobacco, or drug use.   Review of Systems: Constitutional: Weight is stable.  Eyes: No changes in vision. ENT: No oral lesions, sore throat.  GI: see HPI.  Heme/Lymph: No easy bruising.  CV: No chest pain.  GU: No hematuria.  Integumentary: No rashes.  Neuro: No headaches.  Psych: No depression/anxiety.  Endocrine: No  heat/cold intolerance.  Allergic/Immunologic: No urticaria.  Resp: No cough, SOB.  Musculoskeletal: No joint swelling.    Physical Examination: BP 129/83 (BP Location: Right Arm)   Pulse 79   Temp 98.2 F (36.8 C) (Oral)   Resp 18   Ht 5\' 3"  (1.6 m)   Wt 71.7 kg (158 lb)   SpO2 100%   BMI 27.99 kg/m  Gen: NAD, alert and oriented x 4 HEENT: PEERLA, EOMI, Neck: supple, no JVD or thyromegaly Chest: CTA bilaterally, no wheezes, crackles, or other adventitious sounds CV: RRR, no m/g/c/r Abd: soft, NT, ND, +BS in all four quadrants; no HSM, guarding, ridigity, or rebound tenderness Ext: no edema, well perfused with 2+ pulses, Skin: no rash or lesions noted Lymph: no LAD  Data: Lab Results  Component Value Date   WBC 9.2 07/08/2017   HGB 9.0 (L) 07/09/2017   HCT 29.6 (L) 07/08/2017   MCV 89.7 07/08/2017   PLT 296 07/08/2017   Recent Labs  Lab  07/08/17 2237 07/09/17 0400 07/09/17 0958  HGB 9.2* 8.7* 9.0*   Lab Results  Component Value Date   NA 135 07/09/2017   K 3.7 07/09/2017   CL 104 07/09/2017   CO2 25 07/09/2017   BUN 21 (H) 07/09/2017   CREATININE 0.48 07/09/2017   Lab Results  Component Value Date   ALT 14 07/08/2017   AST 19 07/08/2017   ALKPHOS 48 07/08/2017   BILITOT 0.7 07/08/2017   No results for input(s): APTT, INR, PTT in the last 168 hours. Assessment/Plan: Ms. Edison is a 62 y/o Caucasian female with a PMH of GERD, HTN, and hypothyroidism admitted to the hospital for GI bleed  1. GI Bleed - Hematochezia: - Patient's last colonoscopy was 10 years ago, normal per pt. Report not available in EMR - Hemoglobin 9.0 at 1000 this morning. Continue to monitor hemoglobin q12 hours. - Most likely a lower GI etiology of her hematochezia. Will obtain definitive evaluation with a EGD/Colonoscopy.  - I discussed risks, benefits, and potential complications of procedures, including bleeding, infection, small puncture to the intestine/esophagus, or problems with  anesthesia. Patient verbalizes understanding and consents to proceed. Denies CP/SOB/blood thinners. - Patient will be placed on clear liquid diet today. Start prep around 1700 today. NPO at 0500 tomorrow morning. - Orders are placed in computer - All of the patient's questions were answered. Patient agrees with above plan of care.   Recommendations: - Plan for EGD/Colonoscopy tomorrow - Further recommendations after procedure  Thank you for the consult. Please call with questions or concerns.  Geanie Kenning, PA-C [place]

## 2017-07-09 NOTE — Progress Notes (Signed)
Checked on pt around 1830 and pt had drank about half of prep within 1.5 hours. Instructed pt that she did not have to drink so fast. Pt needed to go to bathroom so I assisted. Pt passed out on the toilet. Rapid response was called. Vitals taken. Discussed case with Bridgett Larsson prime doc. Appropriate orders placed. Gave report to oncoming nurse who will continue to monitor. BSC beside bed. Bed alarm set.

## 2017-07-09 NOTE — H&P (View-Only) (Signed)
GI Inpatient Consult Note  Reason for Consult: GI Bleed   Attending Requesting Consult: Dr. Leslye Peer  History of Present Illness: Julie Castillo is a 62 y.o. female seen for evaluation of suspected GI bleed/hematochezia at the request of Dr. Leslye Peer. Pt reports that she was having normal, formed BMs until Wednesday afternoon she noticed one episode of mixed bright red and maroon blood on the toilet paper and in the bowl covering the stool. She reports 3 BMs yesterday with hematochezia and endorses a near syncopal episode and dizziness which prompted her to come to the ED. Hemoccult + in ED. Reports last colonoscopy was 10 years ago, normal per pt. No prior history of hemorrhoids or diverticulosis. Pt not on blood thinners or anticoagulation. Hemoglobin on admission was 10.2, 9.2 last night, and 8.7 this morning.  Patient seen and examined this morning resting comfortably in hospital bed. She reports no BMs since admission. Pt has remained NPO since a tuna fish sandwich for lunch yesterday. No episodes of hematochezia or melena since admission. No prior history of rectal bleeding or anemia. Patient states she feels better than yesterday and less fatigued. Denies fever, nausea, vomiting, dysphagia, abdominal pain. No frequent NSAID use. Pt takes omeprazole 20 mg bid at home and this works well to prevent GERD exacerbations.   Last Colonoscopy: 10 years ago, report n/a via EMR Last Endoscopy: Never performed   Past Medical History:  Past Medical History:  Diagnosis Date  . Arthritis   . Edema    lower extremities after standing a long time   . Family history of adverse reaction to anesthesia    father- post op became violent   . Foot drop, right 2016   developed foot drop following RTKA in 2016; had to wear foot drop shoe following discharge ; per pat "i had pain in my ankle real bad but it ended up being dx as foot drop"  . GERD (gastroesophageal reflux disease)   . Hypertension   .  Hypothyroidism   . Pneumonia    hx of   . PONV (postoperative nausea and vomiting)    post op day 1   . Varicose veins     Problem List: Patient Active Problem List   Diagnosis Date Noted  . GIB (gastrointestinal bleeding) 07/08/2017  . Vitreous floaters of right eye 01/29/2017  . 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 Mixed hyperlipidemia 10/02/2016  . Lower extremity edema 03/27/2016  . Vitamin D deficiency 03/27/2016  . Seasonal allergies 03/27/2016  . Ptosis of eyelid, left 03/27/2016  . Essential hypertension 03/22/2015  . Gastroesophageal reflux disease without esophagitis 03/22/2015  . Hypothyroidism due to acquired atrophy of thyroid 03/22/2015  . 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 BREAST CYST ASPIRATION Left 1988   "it just a fineneedle aspiration"   . EYE SURGERY  2001,2005   left eye surgery x 2 ( eyelid)   . 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 TOTAL KNEE ARTHROPLASTY Left 10/19/2016   Procedure: LEFT TOTAL KNEE ARTHROPLASTY;  Surgeon: Paralee Cancel, MD;  Location: [facility];  Service: Orthopedics;  Laterality: Left;    Allergies: Allergies  Allergen Reactions  . Sulfa Antibiotics Hives  . Tape Other (See Comments) and Rash    Sensitive skin- can use paper tape bandaids-  make skin red  Sensitive skin- can use paper tape    Home Medications: Medications Prior to Admission  Medication Sig Dispense Refill Last Dose  . Cholecalciferol (VITAMIN D) 2000 units CAPS Take 2,000 Units by mouth daily.   07/08/2017 at Unknown time  . Cyanocobalamin (VITAMIN B 12 PO) Take 1 tablet by mouth daily.   07/08/2017 at Unknown time  . enalapril (VASOTEC) 20 MG tablet TAKE 1 TABLET BY MOUTH 2 TIMES DAILY. 180 tablet 3 07/08/2017 at Unknown time  . fluticasone (FLONASE) 50 MCG/ACT nasal  spray PLACE 2 SPRAYS DAILY INTO BOTH NOSTRILS. 16 g 2 07/08/2017 at Unknown time  . hydrochlorothiazide (HYDRODIURIL) 25 MG tablet TAKE 1 TABLET DAILY BY MOUTH. 90 tablet 3 07/08/2017 at Unknown time  . levothyroxine (SYNTHROID, LEVOTHROID) 25 MCG tablet TAKE 1 TABLET (25 MCG TOTAL) BY MOUTH DAILY BEFORE BREAKFAST. 90 tablet 3 07/08/2017 at Unknown time  . omeprazole (PRILOSEC) 20 MG capsule Take 1 capsule (20 mg total) by mouth 2 (two) times daily. 180 capsule 3 07/08/2017 at Unknown time  . Pyridoxine HCl (VITAMIN B6 PO) Take 1 tablet by mouth daily.   07/08/2017 at Unknown time   Home medication reconciliation was completed with the patient.   Scheduled Inpatient Medications:   . enalapril  20 mg Oral BID  . fluticasone  2 spray Each Nare Daily  . levothyroxine  25 mcg Oral QAC breakfast    Continuous Inpatient Infusions:   . sodium chloride 50 mL/hr at 07/09/17 0746  . famotidine (PEPCID) IV Stopped (07/09/17 0958)    PRN Inpatient Medications:  acetaminophen **OR** acetaminophen, albuterol, HYDROcodone-acetaminophen, ondansetron **OR** ondansetron (ZOFRAN) IV  Family History: family history includes Breast cancer (age of onset: 5) in her cousin; Breast cancer (age of onset: 47) in her paternal aunt; Breast cancer (age of onset: 34) in her mother.  The patient's family history is negative for inflammatory bowel disorders, GI malignancy, or solid organ transplantation.  Social History:   reports that she has never smoked. She has never used smokeless tobacco. She reports that she drinks alcohol. She reports that she does not use drugs. The patient denies ETOH, tobacco, or drug use.   Review of Systems: Constitutional: Weight is stable.  Eyes: No changes in vision. ENT: No oral lesions, sore throat.  GI: see HPI.  Heme/Lymph: No easy bruising.  CV: No chest pain.  GU: No hematuria.  Integumentary: No rashes.  Neuro: No headaches.  Psych: No depression/anxiety.  Endocrine: No  heat/cold intolerance.  Allergic/Immunologic: No urticaria.  Resp: No cough, SOB.  Musculoskeletal: No joint swelling.    Physical Examination: BP 129/83 (BP Location: Right Arm)   Pulse 79   Temp 98.2 F (36.8 C) (Oral)   Resp 18   Ht 5\' 3"  (1.6 m)   Wt 71.7 kg (158 lb)   SpO2 100%   BMI 27.99 kg/m  Gen: NAD, alert and oriented x 4 HEENT: PEERLA, EOMI, Neck: supple, no JVD or thyromegaly Chest: CTA bilaterally, no wheezes, crackles, or other adventitious sounds CV: RRR, no m/g/c/r Abd: soft, NT, ND, +BS in all four quadrants; no HSM, guarding, ridigity, or rebound tenderness Ext: no edema, well perfused with 2+ pulses, Skin: no rash or lesions noted Lymph: no LAD  Data: Lab Results  Component Value Date   WBC 9.2 07/08/2017   HGB 9.0 (L) 07/09/2017   HCT 29.6 (L) 07/08/2017   MCV 89.7 07/08/2017   PLT 296 07/08/2017   Recent Labs  Lab  07/08/17 2237 07/09/17 0400 07/09/17 0958  HGB 9.2* 8.7* 9.0*   Lab Results  Component Value Date   NA 135 07/09/2017   K 3.7 07/09/2017   CL 104 07/09/2017   CO2 25 07/09/2017   BUN 21 (H) 07/09/2017   CREATININE 0.48 07/09/2017   Lab Results  Component Value Date   ALT 14 07/08/2017   AST 19 07/08/2017   ALKPHOS 48 07/08/2017   BILITOT 0.7 07/08/2017   No results for input(s): APTT, INR, PTT in the last 168 hours. Assessment/Plan: Ms. Burling is a 62 y/o Caucasian female with a PMH of GERD, HTN, and hypothyroidism admitted to the hospital for GI bleed  1. GI Bleed - Hematochezia: - Patient's last colonoscopy was 10 years ago, normal per pt. Report not available in EMR - Hemoglobin 9.0 at 1000 this morning. Continue to monitor hemoglobin q12 hours. - Most likely a lower GI etiology of her hematochezia. Will obtain definitive evaluation with a EGD/Colonoscopy.  - I discussed risks, benefits, and potential complications of procedures, including bleeding, infection, small puncture to the intestine/esophagus, or problems with  anesthesia. Patient verbalizes understanding and consents to proceed. Denies CP/SOB/blood thinners. - Patient will be placed on clear liquid diet today. Start prep around 1700 today. NPO at 0500 tomorrow morning. - Orders are placed in computer - All of the patient's questions were answered. Patient agrees with above plan of care.   Recommendations: - Plan for EGD/Colonoscopy tomorrow - Further recommendations after procedure  Thank you for the consult. Please call with questions or concerns.  Geanie Kenning, PA-C [place]

## 2017-07-10 ENCOUNTER — Inpatient Hospital Stay: Payer: 59 | Admitting: Certified Registered"

## 2017-07-10 ENCOUNTER — Encounter
Admission: EM | Disposition: A | Discharge status: Home / Self Care | Payer: Self-pay | Source: Home / Self Care | Attending: Internal Medicine

## 2017-07-10 DIAGNOSIS — K922 Gastrointestinal hemorrhage, unspecified: Secondary | ICD-10-CM

## 2017-07-10 HISTORY — PX: COLONOSCOPY: SHX5424

## 2017-07-10 HISTORY — PX: ESOPHAGOGASTRODUODENOSCOPY: SHX5428

## 2017-07-10 LAB — CBC
HCT: 20.1 % — ABNORMAL LOW (ref 35.0–47.0)
Hemoglobin: 7 g/dL — ABNORMAL LOW (ref 12.0–16.0)
MCH: 31.2 pg (ref 26.0–34.0)
MCHC: 35.1 g/dL (ref 32.0–36.0)
MCV: 88.8 fL (ref 80.0–100.0)
PLATELETS: 180 10*3/uL (ref 150–440)
RBC: 2.26 MIL/uL — AB (ref 3.80–5.20)
RDW: 13.8 % (ref 11.5–14.5)
WBC: 5.3 10*3/uL (ref 3.6–11.0)

## 2017-07-10 LAB — HEMOGLOBIN AND HEMATOCRIT, BLOOD
HCT: 25.1 % — ABNORMAL LOW (ref 35.0–47.0)
HEMATOCRIT: 36.2 % (ref 35.0–47.0)
HEMATOCRIT: 27.9 % — AB (ref 35.0–47.0)
HEMATOCRIT: 26.1 % — AB (ref 35.0–47.0)
HEMOGLOBIN: 12.2 g/dL (ref 12.0–16.0)
HEMOGLOBIN: 9.7 g/dL — AB (ref 12.0–16.0)
HEMOGLOBIN: 9.2 g/dL — AB (ref 12.0–16.0)
Hemoglobin: 8.8 g/dL — ABNORMAL LOW (ref 12.0–16.0)

## 2017-07-10 LAB — MRSA PCR SCREENING: MRSA BY PCR: NEGATIVE

## 2017-07-10 LAB — HIV ANTIBODY (ROUTINE TESTING W REFLEX): HIV Screen 4th Generation wRfx: NONREACTIVE

## 2017-07-10 LAB — GLUCOSE, CAPILLARY: Glucose-Capillary: 114 mg/dL — ABNORMAL HIGH (ref 65–99)

## 2017-07-10 LAB — ABO/RH: ABO/RH(D): O POS

## 2017-07-10 LAB — PROTIME-INR
INR: 1
Prothrombin Time: 13.1 seconds (ref 11.4–15.2)

## 2017-07-10 SURGERY — COLONOSCOPY
Anesthesia: General

## 2017-07-10 MED ORDER — EPHEDRINE SULFATE 50 MG/ML IJ SOLN
INTRAMUSCULAR | Status: DC | PRN
  Administered 2017-07-10: 20 mg via INTRAVENOUS
  Administered 2017-07-10: 15 mg via INTRAVENOUS

## 2017-07-10 MED ORDER — CALCIUM CHLORIDE 10 % IV SOLN
INTRAVENOUS | Status: DC | PRN
  Administered 2017-07-10 (×2): 500 mg via INTRAVENOUS

## 2017-07-10 MED ORDER — LIDOCAINE HCL (PF) 2 % IJ SOLN
INTRAMUSCULAR | Status: AC
  Filled 2017-07-10: qty 10

## 2017-07-10 MED ORDER — ORAL CARE MOUTH RINSE
15.0000 mL | Freq: Two times a day (BID) | OROMUCOSAL | Status: DC
  Administered 2017-07-10 (×2): 15 mL via OROMUCOSAL

## 2017-07-10 MED ORDER — PROPOFOL 500 MG/50ML IV EMUL
INTRAVENOUS | Status: DC | PRN
  Administered 2017-07-10 (×2): 30 ug via INTRAVENOUS

## 2017-07-10 MED ORDER — VASOPRESSIN 20 UNIT/ML IV SOLN
INTRAVENOUS | Status: AC
  Filled 2017-07-10: qty 1

## 2017-07-10 MED ORDER — PROPOFOL 10 MG/ML IV BOLUS
INTRAVENOUS | Status: AC
  Filled 2017-07-10: qty 20

## 2017-07-10 MED ORDER — PROPOFOL 500 MG/50ML IV EMUL: INTRAVENOUS | Status: DC | PRN

## 2017-07-10 MED ORDER — FENTANYL CITRATE (PF) 100 MCG/2ML IJ SOLN
INTRAMUSCULAR | Status: AC
  Filled 2017-07-10: qty 2

## 2017-07-10 MED ORDER — FENTANYL CITRATE (PF) 100 MCG/2ML IJ SOLN
INTRAMUSCULAR | Status: DC | PRN
  Administered 2017-07-10: 50 ug via INTRAVENOUS

## 2017-07-10 MED ORDER — PHENYLEPHRINE HCL 10 MG/ML IJ SOLN
INTRAMUSCULAR | Status: DC | PRN
  Administered 2017-07-10: 200 ug via INTRAVENOUS
  Administered 2017-07-10 (×2): 100 ug via INTRAVENOUS
  Administered 2017-07-10: 200 ug via INTRAVENOUS
  Administered 2017-07-10: 100 ug via INTRAVENOUS
  Administered 2017-07-10: 200 ug via INTRAVENOUS
  Administered 2017-07-10: 100 ug via INTRAVENOUS
  Administered 2017-07-10: 200 ug via INTRAVENOUS
  Administered 2017-07-10 (×3): 100 ug via INTRAVENOUS
  Administered 2017-07-10: 200 ug via INTRAVENOUS

## 2017-07-10 MED ORDER — VASOPRESSIN 20 UNIT/ML IV SOLN
INTRAVENOUS | Status: DC | PRN
  Administered 2017-07-10 (×2): 2 [IU] via INTRAVENOUS
  Administered 2017-07-10: 1 [IU] via INTRAVENOUS
  Administered 2017-07-10 (×2): 2 [IU] via INTRAVENOUS
  Administered 2017-07-10: 1 [IU] via INTRAVENOUS

## 2017-07-10 MED ORDER — PROPOFOL 500 MG/50ML IV EMUL
INTRAVENOUS | Status: DC | PRN
  Administered 2017-07-10: 140 ug/kg/min via INTRAVENOUS

## 2017-07-10 MED ORDER — LIDOCAINE HCL (CARDIAC) PF 100 MG/5ML IV SOSY
PREFILLED_SYRINGE | INTRAVENOUS | Status: DC | PRN
  Administered 2017-07-10: 100 mg via INTRAVENOUS

## 2017-07-10 MED ORDER — EPHEDRINE SULFATE 50 MG/ML IJ SOLN
INTRAMUSCULAR | Status: AC
  Filled 2017-07-10: qty 1

## 2017-07-10 MED ORDER — PROPOFOL 500 MG/50ML IV EMUL
INTRAVENOUS | Status: AC
  Filled 2017-07-10: qty 50

## 2017-07-10 NOTE — Op Note (Signed)
[facility] Gastroenterology Patient Name: Julie Castillo Procedure Date: 07/10/2017 8:11 AM MRN: [id number] Account #: [id number] Date of Birth: 20-Apr-1955 Admit Type: Inpatient Age: 62 Room: [facility] ENDO ROOM 3 Gender: Female Note Status: Finalized Procedure:            Colonoscopy Indications:          Hematochezia, Rectal bleeding, Acute post hemorrhagic                        anemia Providers:            Benay Pike. Alice Reichert MD, MD Referring MD:         Halina Maidens, MD (Referring MD) Medicines:            Propofol per Anesthesia Complications:        No immediate complications. Procedure:            Pre-Anesthesia Assessment:                       - The risks and benefits of the procedure and the                        sedation options and risks were discussed with the                        patient. All questions were answered and informed                        consent was obtained.                       - Patient identification and proposed procedure were                        verified prior to the procedure by the nurse. The                        procedure was verified in the procedure room.                       - ASA Grade Assessment: II - A patient with mild                        systemic disease.                       - After reviewing the risks and benefits, the patient                        was deemed in satisfactory condition to undergo the                        procedure.                       After obtaining informed consent, the colonoscope was                        passed under direct vision. Throughout the procedure,                        the patient's  blood pressure, pulse, and oxygen                        saturations were monitored continuously. The                        Colonoscope was introduced through the anus and                        advanced to the the cecum, identified by appendiceal                        orifice and ileocecal  valve. The colonoscopy was                        performed without difficulty. The colonoscopy was                        technically difficult and complex due to excessive                        bleeding. Successful completion of the procedure was                        aided by lavage. The patient tolerated the procedure.                        The quality of the bowel preparation was inadequate.                        The ileocecal valve, appendiceal orifice, and rectum                        were photographed. Findings:      The perianal and digital rectal examinations were normal. Pertinent       negatives include normal sphincter tone and no palpable rectal lesions.      Multiple small and large-mouthed diverticula were found in the entire       colon. There was active bleeding coming from the diverticular opening.       Lavage of the area was performed using copious amounts of sterile water,       resulting in clearance with fair visualization. Several large clots were       noted in the left colon which were removed by suction and basket       retrieval and elimination from the anorectum. Unfortunately, the       diverticulum bleeding source could not be identified after almost 1.5       hours of searching throughout the colon and after copious lavage and       suctioning of blood and clots.      The exam was otherwise without abnormality on direct and retroflexion       views. Impression:           - Preparation of the colon was inadequate.                       - Moderate diverticulosis in the entire examined colon.                        There was active bleeding  coming from the diverticular                        opening.                       - The examination was otherwise normal on direct and                        retroflexion views.                       - No specimens collected.                       - The exam was suboptimal due to patient preparation.                        - Pancolonic diverticulosis with bleeding source likely                        in the left colon Recommendation:       - Return patient to ICU until patient is stable.                       - The findings and recommendations were discussed with                        the patient's primary physician.                       - The findings and recommendations were discussed with                        the patient.                       - Consider bleeding scan if ongoing bleeding along with                        vascular consult if POSITIVE. Procedure Code(s):    --- Professional ---                       [phone], Colonoscopy, flexible; diagnostic, including                        collection of specimen(s) by brushing or washing, when                        performed (separate procedure) Diagnosis Code(s):    --- Professional ---                       D62, Acute posthemorrhagic anemia                       K62.5, Hemorrhage of anus and rectum                       K92.1, Melena (includes Hematochezia)                       K57.31, Diverticulosis of large intestine without  perforation or abscess with bleeding CPT copyright 2017 American [employer]. All rights reserved. The codes documented in this report are preliminary and upon coder review may  be revised to meet current compliance requirements. Efrain Sella MD, MD 07/10/2017 10:47:41 AM This report has been signed electronically. Number of Addenda: 0 Note Initiated On: 07/10/2017 8:11 AM Scope Withdrawal Time: 1 hour 19 minutes 15 seconds  Total Procedure Duration: 1 hour 40 minutes 34 seconds       [facility]

## 2017-07-10 NOTE — Progress Notes (Signed)
[facility] [place] NAME: Julie Castillo    MR#:  [id number]  DATE OF BIRTH:  1955/04/17  SUBJECTIVE:  CHIEF COMPLAINT: Patient is resting comfortably in ICU denies any abdominal pain.  Patient had colonoscopy today and had profuse diverticular bleeding during the colonoscopy requiring blood transfusion and IV pressors as reported by Dr. Alice Reichert  REVIEW OF SYSTEMS:  CONSTITUTIONAL: No fever, fatigue or weakness.  EYES: No blurred or double vision.  EARS, NOSE, AND THROAT: No tinnitus or ear pain.  RESPIRATORY: No cough, shortness of breath, wheezing or hemoptysis.  CARDIOVASCULAR: No chest pain, orthopnea, edema.  GASTROINTESTINAL: No nausea, vomiting, diarrhea or abdominal pain.  Denies active lower GI bleed GENITOURINARY: No dysuria, hematuria.  ENDOCRINE: No polyuria, nocturia,  HEMATOLOGY: No anemia, easy bruising or bleeding SKIN: No rash or lesion. MUSCULOSKELETAL: No joint pain or arthritis.   NEUROLOGIC: No tingling, numbness, weakness.  PSYCHIATRY: No anxiety or depression.   DRUG ALLERGIES:   Allergies  Allergen Reactions  . Sulfa Antibiotics Hives  . Tape Other (See Comments) and Rash    Sensitive skin- can use paper tape bandaids- make skin red  Sensitive skin- can use paper tape    VITALS:  Blood pressure 114/75, pulse 79, temperature 97.7 F (36.5 C), temperature source Oral, resp. rate 19, height 5\' 3"  (1.6 m), weight 73.3 kg (161 lb 9.6 oz), SpO2 100 %.  PHYSICAL EXAMINATION:  GENERAL:  62 y.o.-year-old patient lying in the bed with no acute distress.  EYES: Pupils equal, round, reactive to light and accommodation. No scleral icterus. Extraocular muscles intact.  HEENT: Head atraumatic, normocephalic. Oropharynx and nasopharynx clear.  NECK:  Supple, no jugular venous distention. No thyroid enlargement, no tenderness.  LUNGS: Normal breath sounds bilaterally, no wheezing, rales,rhonchi or crepitation. No use of  accessory muscles of respiration.  CARDIOVASCULAR: S1, S2 normal. No murmurs, rubs, or gallops.  ABDOMEN: Soft, nontender, nondistended. Bowel sounds present.  EXTREMITIES: No pedal edema, cyanosis, or clubbing.  NEUROLOGIC: Cranial nerves II through XII are intact. Muscle strength  global weakness in all extremities. Sensation intact. Gait not checked.  PSYCHIATRIC: The patient is alert and oriented x 3.  SKIN: No obvious rash, lesion, or ulcer.    LABORATORY PANEL:   CBC Recent Labs  Lab 07/10/17 0450 07/10/17 1131  WBC 5.3  --   HGB 7.0* 12.2  HCT 20.1* 36.2  PLT 180  --    ------------------------------------------------------------------------------------------------------------------  Chemistries  Recent Labs  Lab 07/08/17 1623 07/08/17 2237 07/09/17 0400  NA 130*  --  135  K 3.3*  --  3.7  CL 97*  --  104  CO2 25  --  25  GLUCOSE 123*  --  102*  BUN 29*  --  21*  CREATININE 0.73  --  0.48  CALCIUM 8.3*  --  8.1*  MG  --  1.9  --   AST 19  --   --   ALT 14  --   --   ALKPHOS 48  --   --   BILITOT 0.7  --   --    ------------------------------------------------------------------------------------------------------------------  Cardiac Enzymes No results for input(s): TROPONINI in the last 168 hours. ------------------------------------------------------------------------------------------------------------------  RADIOLOGY:  No results found.  EKG:   Orders placed or performed during the hospital encounter of 07/08/17  . ED EKG  . ED EKG  . EKG 12-Lead  . EKG 12-Lead  . EKG 12-Lead  . EKG 12-Lead  ASSESSMENT AND PLAN:   #Acute GI bleeding.  Diverticular- Patient had colonoscopy today by Dr. Alice Reichert which has revealed moderate diverticulosis in the entire colon and noticed profuse active bleeding coming from the diverticular opening, patient became significantly hypotensive requiring pressors and blood transfusion. Patient was transferred to  intensive care unit for close monitoring, discussed with intensivist Dr. Jefferson Fuel and if the patient continues to bleed profusely she needs bleeding scan and vascular surgery consult N.p.o. except meds and sips of water, IV fluids Protonix IV twice daily appreciate GI recommendations Hemodynamically stable during my examination  # Symptomatic anemia. Check hemoglobin every 4 hours.  PRBC transfusion as needed.  #Dehydration and hyponatremia.  Normal saline IV and follow-up BMP.  Hold HCTZ.  #Hypokalemia.  Give potassium and follow-up level. Hold HCTZ.  # Hypertension.    Currently blood pressure is soft, hold home medications for hypertension  #Hypothyroidism continue Synthyroid      All the records are reviewed and case discussed with Care Management/Social Workerr. Management plans discussed with the patient, family and they are in agreement.  CODE STATUS: fc   TOTAL TIME TAKING CARE OF THIS PATIENT: 36 minutes.   POSSIBLE D/C IN 2  DAYS, DEPENDING ON CLINICAL CONDITION.  Note: This dictation was prepared with Dragon dictation along with smaller phrase technology. Any transcriptional errors that result from this process are unintentional.   Nicholes Mango M.D on 07/10/2017 at 2:37 PM  Between 7am to 6pm - Pager - [phone] After 6pm go to [email or web address] - password [email or web address] [facility]  [facility]  Office  [phone]  CC: Primary care physician; Glean Hess, MD

## 2017-07-10 NOTE — Anesthesia Post-op Follow-up Note (Signed)
Anesthesia QCDR form completed.        

## 2017-07-10 NOTE — Transfer of Care (Signed)
Immediate Anesthesia Transfer of Care Note  Patient: Julie Castillo  Procedure(s) Performed: COLONOSCOPY (N/A ) ESOPHAGOGASTRODUODENOSCOPY (EGD) (N/A )  Patient Location: PACU  Anesthesia Type:MAC  Level of Consciousness: awake, alert , oriented and patient cooperative  Airway & Oxygen Therapy: Patient Spontanous Breathing  Post-op Assessment: Report given to RN and Post -op Vital signs reviewed and stable  Post vital signs: Reviewed and stable  Last Vitals:  Vitals Value Taken Time  BP 126/90 07/10/2017 10:27 AM  Temp 36.4 C 07/10/2017 10:27 AM  Pulse 85 07/10/2017 10:29 AM  Resp 16 07/10/2017 10:29 AM  SpO2 92 % 07/10/2017 10:29 AM  Vitals shown include unvalidated device data.  Last Pain:  Vitals:   07/10/17 1027  TempSrc:   PainSc: 0-No pain         Complications: No apparent anesthesia complications

## 2017-07-10 NOTE — OR Nursing (Signed)
Pt s/p colonoscopy and upper endoscopy. Bloody. output from rectum 1800c blood with huge clots. Transferred to PACU POSTOP .

## 2017-07-10 NOTE — Care Management (Signed)
I repeated the findings of the colonoscopy to the patient and met with the patient's husband and brother to explain the findings and prognosis of most cases of diverticular bleeding. Given the patient's clinically stable status, I will begin a clear liquid diet. I will continue to follow the patient.

## 2017-07-10 NOTE — Op Note (Signed)
[facility] Gastroenterology Patient Name: Julie Castillo Procedure Date: 07/10/2017 8:12 AM MRN: [id number] Account #: [id number] Date of Birth: 01-24-1956 Admit Type: Inpatient Age: 62 Room: [facility] ENDO ROOM 3 Gender: Female Note Status: Finalized Procedure:            Upper GI endoscopy Indications:          Gastrointestinal bleeding of unknown origin, Suspected                        upper gastrointestinal bleeding in patient with                        unexplained iron deficiency anemia Providers:            Benay Pike. Alice Reichert MD, MD Referring MD:         Halina Maidens, MD (Referring MD) Medicines:            Propofol per Anesthesia Complications:        No immediate complications. Procedure:            Pre-Anesthesia Assessment:                       - The risks and benefits of the procedure and the                        sedation options and risks were discussed with the                        patient. All questions were answered and informed                        consent was obtained.                       - Patient identification and proposed procedure were                        verified prior to the procedure by the nurse. The                        procedure was verified in the procedure room.                       - ASA Grade Assessment: II - A patient with mild                        systemic disease.                       - After reviewing the risks and benefits, the patient                        was deemed in satisfactory condition to undergo the                        procedure.                       After obtaining informed consent, the endoscope was  passed under direct vision. Throughout the procedure,                        the patient's blood pressure, pulse, and oxygen                        saturations were monitored continuously. The Endoscope                        was introduced through the mouth, and advanced to the                         third part of duodenum. The upper GI endoscopy was                        accomplished without difficulty. The patient tolerated                        the procedure well. Findings:      The examined esophagus was normal.      A 2 cm hiatal hernia was present.      The examined duodenum was normal.      The exam was otherwise without abnormality. Impression:           - Normal esophagus.                       - 2 cm hiatal hernia.                       - Normal examined duodenum.                       - The examination was otherwise normal.                       - No specimens collected. Recommendation:       - Proceed with colonoscopy Procedure Code(s):    --- Professional ---                       [phone], Esophagogastroduodenoscopy, flexible, transoral;                        diagnostic, including collection of specimen(s) by                        brushing or washing, when performed (separate procedure) Diagnosis Code(s):    --- Professional ---                       K44.9, Diaphragmatic hernia without obstruction or                        gangrene                       K92.2, Gastrointestinal hemorrhage, unspecified                       D50.9, Iron deficiency anemia, unspecified CPT copyright 2017 American Medical Association. All rights reserved. The codes documented in this report are preliminary and upon coder review may  be revised to meet current compliance requirements. Benay Pike  [facility] MD, MD 07/10/2017 8:25:23 AM This report has been signed electronically. Number of Addenda: 0 Note Initiated On: 07/10/2017 8:12 AM      [facility]

## 2017-07-10 NOTE — Progress Notes (Signed)
Pt transferred to unit from PACU. Belongings (including cell phone, cell charger, and clothes) were brought up from previous room via nurse from [facility]. Pt is alert & oriented x 4, slightly tearful, and reports mild anxiety regarding disease process. She denies pain. VSS: BP (!) 145/90   Pulse (!) 103   Temp (!) 96.6 F (35.9 C) (Axillary)   Resp 16   Ht 5\' 3"  (1.6 m)   Wt 73.3 kg (161 lb 9.6 oz)   SpO2 100%   BMI 28.63 kg/m . Warm blankets provided for pt due to cool temperature. Per report from PACU nurse, pt received 3units of PRBC and had EBL of 2046ml during procedure. Dr. Jefferson Fuel at bedside, rounding on patient. He was updated to the above. Pt states her husband is on his way to be with her at bedside. Pt has no current complaints.

## 2017-07-10 NOTE — Anesthesia Postprocedure Evaluation (Signed)
Anesthesia Post Note  Patient: Julie Castillo  Procedure(s) Performed: COLONOSCOPY (N/A ) ESOPHAGOGASTRODUODENOSCOPY (EGD) (N/A )  Patient location during evaluation: SICU Anesthesia Type: General Level of consciousness: awake and alert Pain management: pain level controlled Vital Signs Assessment: post-procedure vital signs reviewed and stable Respiratory status: spontaneous breathing, nonlabored ventilation and respiratory function stable Cardiovascular status: stable Postop Assessment: no apparent nausea or vomiting Anesthetic complications: no     Last Vitals:  Vitals:   07/10/17 1636 07/10/17 1800  BP:  110/65  Pulse:  78  Resp:  15  Temp: 36.6 C   SpO2:  99%    Last Pain:  Vitals:   07/10/17 1636  TempSrc: Oral  PainSc:                  Martha Clan

## 2017-07-10 NOTE — Consult Note (Signed)
Reason for Consult: Gastrointestinal bleeding Referring Physician: Dr. Venita Lick is an 62 y.o. female.  HPI: Julie Castillo is a very pleasant 62 year old female with a past medical history remarkable for hypertension, gastroesophageal reflux disease, hypothyroidism, pneumonia who presented to the emergency department on 5/16 after noticing maroon bloody stool, syncopal episode with 3 additional episodes of brighter red blood per rectum.  She felt dizzy and weak and near syncopal.  She denied any symptoms of chest pain, shortness of breath, abdominal pain and is not on any anticoagulants or antiplatelet agents.  She subsequently received GI prep, status post EGD and colonoscopy.  Her EGD was essentially negative however she had bleeding which was felt to be most likely left-sided however unable to identify the source.  She received 3 units of packed red blood cells emergently secondary to active bleeding and hypotension.  She subsequently was transferred to the intensive care unit for further evaluation and management.  At this time she is awake, alert, in no acute distress and hemodynamically stable repeat CBC, PT/INR and platelets being drawn.  Past Medical History:  Diagnosis Date  . Arthritis   . Edema    lower extremities after standing a long time   . Family history of adverse reaction to anesthesia    father- post op became violent   . Foot drop, right 2016   developed foot drop following RTKA in 2016; had to wear foot drop shoe following discharge ; per pat "i had pain in my ankle real bad but it ended up being dx as foot drop"  . GERD (gastroesophageal reflux disease)   . Hypertension   . Hypothyroidism   . Pneumonia    hx of   . PONV (postoperative nausea and vomiting)    post op day 1   . Varicose veins     Past Surgical History:  Procedure Laterality Date  . BREAST CYST ASPIRATION Left 1988   "it just a fineneedle aspiration"   . EYE SURGERY  2001,2005   left eye  surgery x 2 ( eyelid)   . 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 TOTAL KNEE ARTHROPLASTY Left 10/19/2016   Procedure: LEFT TOTAL KNEE ARTHROPLASTY;  Surgeon: Paralee Cancel, MD;  Location: [facility];  Service: Orthopedics;  Laterality: Left;    Family History  Problem Relation Age of Onset  . Breast cancer Mother 29  . Breast cancer Paternal Aunt 64  . Breast cancer Cousin 35       BRCA Negative    Social History:  reports that she has never smoked. She has never used smokeless tobacco. She reports that she drinks alcohol. She reports that she does not use drugs.  Allergies:  Allergies  Allergen Reactions  . Sulfa Antibiotics Hives  . Tape Other (See Comments) and Rash    Sensitive skin- can use paper tape bandaids- make skin red  Sensitive skin- can use paper tape    Medications: I have reviewed the patient's current medications.  Results for orders placed or performed during the hospital encounter of 07/08/17 (from the past 48 hour(s))  Comprehensive metabolic panel     Status: Abnormal   Collection Time: 07/08/17  4:23 PM  Result Value Ref Range   Sodium 130 (L) 135 - 145 mmol/L   Potassium 3.3 (L) 3.5 - 5.1 mmol/L   Chloride  97 (L) 101 - 111 mmol/L   CO2 25 22 - 32 mmol/L   Glucose, Bld 123 (H) 65 - 99 mg/dL   BUN 29 (H) 6 - 20 mg/dL   Creatinine, Ser 0.73 0.44 - 1.00 mg/dL   Calcium 8.3 (L) 8.9 - 10.3 mg/dL   Total Protein 5.9 (L) 6.5 - 8.1 g/dL   Albumin 3.8 3.5 - 5.0 g/dL   AST 19 15 - 41 U/L   ALT 14 14 - 54 U/L   Alkaline Phosphatase 48 38 - 126 U/L   Total Bilirubin 0.7 0.3 - 1.2 mg/dL   GFR calc non Af Amer >60 >60 mL/min   GFR calc Af Amer >60 >60 mL/min    Comment: (NOTE) The eGFR has been calculated using the CKD EPI equation. This calculation has not been validated in all clinical situations. eGFR's persistently <60  mL/min signify possible Chronic Kidney Disease.    Anion gap 8 5 - 15    Comment: Performed at [facility], [address] [place], [place] [address]  CBC     Status: Abnormal   Collection Time: 07/08/17  4:23 PM  Result Value Ref Range   WBC 9.2 3.6 - 11.0 K/uL   RBC 3.30 (L) 3.80 - 5.20 MIL/uL   Hemoglobin 10.2 (L) 12.0 - 16.0 g/dL   HCT 29.6 (L) 35.0 - 47.0 %   MCV 89.7 80.0 - 100.0 fL   MCH 30.9 26.0 - 34.0 pg   MCHC 34.5 32.0 - 36.0 g/dL   RDW 13.7 11.5 - 14.5 %   Platelets 296 150 - 440 K/uL    Comment: Performed at [facility], [address] [place], [place] [address]  Type and screen [facility]     Status: None (Preliminary result)   Collection Time: 07/08/17  4:23 PM  Result Value Ref Range   ABO/RH(D) O POS    Antibody Screen NEG    Sample Expiration 07/11/2017    Unit Number [id number]    Blood Component Type RBC LR PHER2    Unit division 00    Status of Unit ISSUED    Transfusion Status OK TO TRANSFUSE    Crossmatch Result COMPATIBLE    Unit Number [id number]    Blood Component Type RCLI PHER 1    Unit division 00    Status of Unit ISSUED    Transfusion Status OK TO TRANSFUSE    Crossmatch Result COMPATIBLE    Unit Number [id number]    Blood Component Type RED CELLS,LR    Unit division 00    Status of Unit ISSUED    Transfusion Status OK TO TRANSFUSE    Crossmatch Result      Compatible Performed at [facility], [address] [place], [place] [address]    Unit Number [id number]    Blood Component Type RED CELLS,LR    Unit division 00    Status of Unit ALLOCATED    Transfusion Status OK TO TRANSFUSE    Crossmatch Result Compatible   HIV antibody (Routine Testing)     Status: None   Collection Time: 07/08/17 10:37 PM  Result Value Ref Range   HIV Screen 4th Generation wRfx Non Reactive Non Reactive    Comment: (NOTE) Performed At: [facility] [address] [place],  [place] [id number] Rush Farmer MD [id number] Performed at [facility], [address], [place],  [address]   Hemoglobin     Status: Abnormal  Collection Time: 07/08/17 10:37 PM  Result Value Ref Range   Hemoglobin 9.2 (L) 12.0 - 16.0 g/dL    Comment: Performed at [facility], [address] [place], [place] [address]  Magnesium     Status: None   Collection Time: 07/08/17 10:37 PM  Result Value Ref Range   Magnesium 1.9 1.7 - 2.4 mg/dL    Comment: Performed at [facility], [address] [place], [place] [address]  Basic metabolic panel     Status: Abnormal   Collection Time: 07/09/17  4:00 AM  Result Value Ref Range   Sodium 135 135 - 145 mmol/L   Potassium 3.7 3.5 - 5.1 mmol/L   Chloride 104 101 - 111 mmol/L   CO2 25 22 - 32 mmol/L   Glucose, Bld 102 (H) 65 - 99 mg/dL   BUN 21 (H) 6 - 20 mg/dL   Creatinine, Ser 0.48 0.44 - 1.00 mg/dL   Calcium 8.1 (L) 8.9 - 10.3 mg/dL   GFR calc non Af Amer >60 >60 mL/min   GFR calc Af Amer >60 >60 mL/min    Comment: (NOTE) The eGFR has been calculated using the CKD EPI equation. This calculation has not been validated in all clinical situations. eGFR's persistently <60 mL/min signify possible Chronic Kidney Disease.    Anion gap 6 5 - 15    Comment: Performed at [facility], [address] [place], [place] [address]  Hemoglobin     Status: Abnormal   Collection Time: 07/09/17  4:00 AM  Result Value Ref Range   Hemoglobin 8.7 (L) 12.0 - 16.0 g/dL    Comment: Performed at [facility], [address] [place], [place] [address]  Hemoglobin     Status: Abnormal   Collection Time: 07/09/17  9:58 AM  Result Value Ref Range   Hemoglobin 9.0 (L) 12.0 - 16.0 g/dL    Comment: Performed at [facility], [address] [place], [place] [address]  Occult blood card to lab, stool     Status: Abnormal   Collection Time: 07/09/17  2:00 PM  Result Value Ref Range   Fecal  Occult Bld POSITIVE (A) NEGATIVE    Comment: Performed at [facility], [address] [place], [place] [address]  Glucose, capillary     Status: Abnormal   Collection Time: 07/09/17  6:41 PM  Result Value Ref Range   Glucose-Capillary 110 (H) 65 - 99 mg/dL   Comment 1 Notify RN   CBC     Status: Abnormal   Collection Time: 07/09/17  7:09 PM  Result Value Ref Range   WBC 5.6 3.6 - 11.0 K/uL   RBC 2.70 (L) 3.80 - 5.20 MIL/uL   Hemoglobin 8.4 (L) 12.0 - 16.0 g/dL   HCT 24.2 (L) 35.0 - 47.0 %   MCV 89.5 80.0 - 100.0 fL   MCH 31.0 26.0 - 34.0 pg   MCHC 34.6 32.0 - 36.0 g/dL   RDW 13.6 11.5 - 14.5 %   Platelets 228 150 - 440 K/uL    Comment: Performed at [facility], [address] [place], [place] [address]  CBC     Status: Abnormal   Collection Time: 07/10/17  4:50 AM  Result Value Ref Range   WBC 5.3 3.6 - 11.0 K/uL   RBC 2.26 (L) 3.80 - 5.20 MIL/uL   Hemoglobin 7.0 (L) 12.0 - 16.0 g/dL   HCT 20.1 (L) 35.0 - 47.0 %  MCV 88.8 80.0 - 100.0 fL   MCH 31.2 26.0 - 34.0 pg   MCHC 35.1 32.0 - 36.0 g/dL   RDW 13.8 11.5 - 14.5 %   Platelets 180 150 - 440 K/uL    Comment: Performed at [facility], [address] [place], [place] [address]  ABO/Rh     Status: None   Collection Time: 07/10/17  4:50 AM  Result Value Ref Range   ABO/RH(D)      O POS Performed at [facility], [address] [place], [place] [address]   Prepare RBC (crossmatch)     Status: None   Collection Time: 07/10/17  8:44 AM  Result Value Ref Range   Order Confirmation      ORDER PROCESSED BY BLOOD BANK Performed at [facility], [address] [place], [place] [address]   Prepare RBC (crossmatch)     Status: None   Collection Time: 07/10/17  9:19 AM  Result Value Ref Range   Order Confirmation      ORDER PROCESSED BY BLOOD BANK Performed at [facility], [address] [place], [place] [address]   Glucose, capillary     Status: Abnormal    Collection Time: 07/10/17 11:06 AM  Result Value Ref Range   Glucose-Capillary 114 (H) 65 - 99 mg/dL    No results found.  ROS Blood pressure (!) 145/90, pulse (!) 103, temperature (!) 96.6 F (35.9 C), temperature source Axillary, resp. rate 16, height _0  (1.6 m), weight 161 lb 9.6 oz (73.3 kg), SpO2 100 %. Physical Exam Patient is awake, alert in no acute distress Vital signs: Please see the above listed vital signs HEENT: No oral lesions appreciated, trachea is midline, no thyromegaly noted, mucosa/skin is pale Cardiovascular: Regular rate and rhythm Pulmonary: Clear to auscultation Abdominal: Positive bowel sounds, soft nontender exam including left lower quadrant Extremities: No clubbing cyanosis or edema noted Neurologic: No focal deficits appreciated Cutaneous:  pale skin but no cutaneous lesions appreciated  Assessment/Plan:  Active lower intestinal bleeding. Status post EGD and colonoscopy.  Presently hemodynamics are stable.  Has received 3 units of packed red blood cells pending repeat H&H along with coags and platelets.  Prior to transfusion hemoglobin was 7 Which had decreased from 10.2 on admission.  If continues to decrease hemoglobin next step would be bleeding scan and possible vascular angiogram with embolization.  Will follow every 4 CBCs.  Hyponatremia on saline replacement  Hypokalemia will repeat BMP  Kelyn Ponciano 07/10/2017, 11:18 AM

## 2017-07-10 NOTE — Interval H&P Note (Signed)
History and Physical Interval Note:  07/10/2017 8:15 AM  Julie Castillo  has presented today for surgery, with the diagnosis of GI Bleed  The various methods of treatment have been discussed with the patient and family. After consideration of risks, benefits and other options for treatment, the patient has consented to  Procedure(s): COLONOSCOPY (N/A) ESOPHAGOGASTRODUODENOSCOPY (EGD) (N/A) as a surgical intervention .  The patient's history has been reviewed, patient examined, no change in status, stable for surgery.  I have reviewed the patient's chart and labs.  Questions were answered to the patient's satisfaction.     [place], [place]

## 2017-07-10 NOTE — Progress Notes (Signed)
Pt's most recent hemoglobin came back at 9.7. No obvious signs of bleeding have occurred and pt has tolerated a clear liquid dinner tray. Dr. Jefferson Fuel called and notified of the above. No new orders received.

## 2017-07-10 NOTE — Anesthesia Preprocedure Evaluation (Signed)
Anesthesia Evaluation  Patient identified by MRN, date of birth, ID band Patient awake    Reviewed: Allergy & Precautions, H&P , NPO status , Patient's Chart, lab work & pertinent test results, reviewed documented beta blocker date and time   History of Anesthesia Complications (+) PONV, Family history of anesthesia reaction and history of anesthetic complications  Airway Mallampati: II  TM Distance: >3 FB Neck ROM: full    Dental  (+) Dental Advidsory Given, Poor Dentition, Teeth Intact   Pulmonary neg pulmonary ROS,           Cardiovascular Exercise Tolerance: Good hypertension, (-) angina(-) CAD, (-) Past MI, (-) Cardiac Stents and (-) CABG (-) dysrhythmias (-) Valvular Problems/Murmurs     Neuro/Psych negative neurological ROS  negative psych ROS   GI/Hepatic Neg liver ROS, GERD  ,  Endo/Other  neg diabetesHypothyroidism   Renal/GU negative Renal ROS  negative genitourinary   Musculoskeletal   Abdominal   Peds  Hematology negative hematology ROS (+)   Anesthesia Other Findings Past Medical History: No date: Arthritis No date: Edema     Comment:  lower extremities after standing a long time  No date: Family history of adverse reaction to anesthesia     Comment:  father- post op became violent  2016: Foot drop, right     Comment:  developed foot drop following RTKA in 2016; had to wear               foot drop shoe following discharge ; per pat "i had pain               in my ankle real bad but it ended up being dx as foot               drop" No date: GERD (gastroesophageal reflux disease) No date: Hypertension No date: Hypothyroidism No date: Pneumonia     Comment:  hx of  No date: PONV (postoperative nausea and vomiting)     Comment:  post op day 1  No date: Varicose veins   Reproductive/Obstetrics negative OB ROS                             Anesthesia Physical Anesthesia  Plan  ASA: II  Anesthesia Plan: General   Post-op Pain Management:    Induction: Intravenous  PONV Risk Score and Plan: 4 or greater and Propofol infusion  Airway Management Planned: Natural Airway and Nasal Cannula  Additional Equipment:   Intra-op Plan:   Post-operative Plan:   Informed Consent: I have reviewed the patients History and Physical, chart, labs and discussed the procedure including the risks, benefits and alternatives for the proposed anesthesia with the patient or authorized representative who has indicated his/her understanding and acceptance.   Dental Advisory Given  Plan Discussed with: Anesthesiologist, CRNA and Surgeon  Anesthesia Plan Comments:         Anesthesia Quick Evaluation

## 2017-07-11 LAB — BPAM RBC
BLOOD PRODUCT EXPIRATION DATE: 201905222359
BLOOD PRODUCT EXPIRATION DATE: 201906012359
BLOOD PRODUCT EXPIRATION DATE: 201906072359
BLOOD PRODUCT EXPIRATION DATE: 201906112359
ISSUE DATE / TIME: 201905180856
ISSUE DATE / TIME: 201905180937
ISSUE DATE / TIME: 201905180856
UNIT TYPE AND RH: 5100
UNIT TYPE AND RH: 5100
Unit Type and Rh: 5100
Unit Type and Rh: 9500

## 2017-07-11 LAB — TYPE AND SCREEN
ABO/RH(D): O POS
Antibody Screen: NEGATIVE
UNIT DIVISION: 0
UNIT DIVISION: 0
Unit division: 0
Unit division: 0

## 2017-07-11 LAB — BASIC METABOLIC PANEL
Anion gap: 6 (ref 5–15)
BUN: 7 mg/dL (ref 6–20)
CHLORIDE: 105 mmol/L (ref 101–111)
CO2: 25 mmol/L (ref 22–32)
CREATININE: 0.54 mg/dL (ref 0.44–1.00)
Calcium: 7.9 mg/dL — ABNORMAL LOW (ref 8.9–10.3)
GFR calc Af Amer: >60 mL/min (ref >60)
GFR calc non Af Amer: >60 mL/min (ref >60)
Glucose, Bld: 102 mg/dL — ABNORMAL HIGH (ref 65–99)
Potassium: 3.4 mmol/L — ABNORMAL LOW (ref 3.5–5.1)
SODIUM: 136 mmol/L (ref 135–145)

## 2017-07-11 LAB — HEMOGLOBIN AND HEMATOCRIT, BLOOD
HCT: 27.6 % — ABNORMAL LOW (ref 35.0–47.0)
HCT: 27.9 % — ABNORMAL LOW (ref 35.0–47.0)
HEMOGLOBIN: 9.7 g/dL — AB (ref 12.0–16.0)
Hemoglobin: 10 g/dL — ABNORMAL LOW (ref 12.0–16.0)

## 2017-07-11 LAB — CBC
HCT: 26.8 % — ABNORMAL LOW (ref 35.0–47.0)
HEMOGLOBIN: 9.5 g/dL — AB (ref 12.0–16.0)
MCH: 30.1 pg (ref 26.0–34.0)
MCHC: 35.4 g/dL (ref 32.0–36.0)
MCV: 85 fL (ref 80.0–100.0)
Platelets: 171 10*3/uL (ref 150–440)
RBC: 3.15 MIL/uL — ABNORMAL LOW (ref 3.80–5.20)
RDW: 15.9 % — ABNORMAL HIGH (ref 11.5–14.5)
WBC: 6.6 10*3/uL (ref 3.6–11.0)

## 2017-07-11 LAB — PREPARE RBC (CROSSMATCH)

## 2017-07-11 MED ORDER — HYDROCHLOROTHIAZIDE 25 MG PO TABS
25.0000 mg | ORAL_TABLET | Freq: Every day | ORAL | Status: DC
  Administered 2017-07-11: 25 mg via ORAL
  Filled 2017-07-11: qty 1

## 2017-07-11 MED ORDER — ENALAPRIL MALEATE 20 MG PO TABS
20.0000 mg | ORAL_TABLET | Freq: Every day | ORAL | Status: DC
  Administered 2017-07-11 – 2017-07-12 (×2): 20 mg via ORAL
  Filled 2017-07-11 (×2): qty 1

## 2017-07-11 MED ORDER — FAMOTIDINE 20 MG PO TABS
20.0000 mg | ORAL_TABLET | Freq: Two times a day (BID) | ORAL | Status: DC
  Administered 2017-07-11 – 2017-07-12 (×2): 20 mg via ORAL
  Filled 2017-07-11 (×2): qty 1

## 2017-07-11 MED ORDER — POTASSIUM CHLORIDE CRYS ER 20 MEQ PO TBCR
40.0000 meq | EXTENDED_RELEASE_TABLET | Freq: Once | ORAL | Status: AC
  Administered 2017-07-11: 40 meq via ORAL
  Filled 2017-07-11: qty 2

## 2017-07-11 NOTE — Progress Notes (Signed)
[facility] Gastroenterology Inpatient Progress Note  Subjective: Patient seen for follow-up diverticular bleed. Patient has had no recurrent bleeding and denies abdominal pain.  Objective: Vital signs in last 24 hours: Temp:  [97.8 F (36.6 C)-98.5 F (36.9 C)] 97.9 F (36.6 C) (05/19 1612) Pulse Rate:  [69-93] 78 (05/19 1612) Resp:  [10-23] 18 (05/19 1612) BP: (110-164)/(57-114) 139/82 (05/19 1612) SpO2:  [98 %-100 %] 100 % (05/19 1612) Blood pressure 139/82, pulse 78, temperature 97.9 F (36.6 C), resp. rate 18, height 5\' 3"  (1.6 m), weight 73.3 kg (161 lb 9.6 oz), SpO2 100 %.    Intake/Output from previous day: 05/18 0701 - 05/19 0700 In: 4863.3 [I.V.:3803.3; Blood:960; IV Piggyback:100] Out: 2525 [Urine:2525]  Intake/Output this shift: Total I/O In: 440 [P.O.:440] Out: -    General appearance:  Alert, no acute distress. In good spirits. Resp: clear to auscultation Cardio:  Regular rate, normal S1 and S2 sounds GI:  Soft, benign, bowel sounds positive Extremities:  No edema.   Lab Results: Results for orders placed or performed during the hospital encounter of 07/08/17 (from the past 24 hour(s))  Hemoglobin and hematocrit, blood     Status: Abnormal   Collection Time: 07/10/17  4:47 PM  Result Value Ref Range   Hemoglobin 9.7 (L) 12.0 - 16.0 g/dL   HCT 27.9 (L) 35.0 - 47.0 %  Hemoglobin and hematocrit, blood     Status: Abnormal   Collection Time: 07/10/17  7:45 PM  Result Value Ref Range   Hemoglobin 9.2 (L) 12.0 - 16.0 g/dL   HCT 26.1 (L) 35.0 - 47.0 %  Hemoglobin and hematocrit, blood     Status: Abnormal   Collection Time: 07/10/17 11:05 PM  Result Value Ref Range   Hemoglobin 8.8 (L) 12.0 - 16.0 g/dL   HCT 25.1 (L) 35.0 - 47.0 %  CBC     Status: Abnormal   Collection Time: 07/11/17  5:02 AM  Result Value Ref Range   WBC 6.6 3.6 - 11.0 K/uL   RBC 3.15 (L) 3.80 - 5.20 MIL/uL   Hemoglobin 9.5 (L) 12.0 - 16.0 g/dL   HCT 26.8 (L) 35.0 - 47.0 %   MCV  85.0 80.0 - 100.0 fL   MCH 30.1 26.0 - 34.0 pg   MCHC 35.4 32.0 - 36.0 g/dL   RDW 15.9 (H) 11.5 - 14.5 %   Platelets 171 150 - 440 K/uL  Basic metabolic panel     Status: Abnormal   Collection Time: 07/11/17  5:02 AM  Result Value Ref Range   Sodium 136 135 - 145 mmol/L   Potassium 3.4 (L) 3.5 - 5.1 mmol/L   Chloride 105 101 - 111 mmol/L   CO2 25 22 - 32 mmol/L   Glucose, Bld 102 (H) 65 - 99 mg/dL   BUN 7 6 - 20 mg/dL   Creatinine, Ser 0.54 0.44 - 1.00 mg/dL   Calcium 7.9 (L) 8.9 - 10.3 mg/dL   GFR calc non Af Amer >60 >60 mL/min   GFR calc Af Amer >60 >60 mL/min   Anion gap 6 5 - 15  Hemoglobin and hematocrit, blood     Status: Abnormal   Collection Time: 07/11/17  8:41 AM  Result Value Ref Range   Hemoglobin 9.7 (L) 12.0 - 16.0 g/dL   HCT 27.6 (L) 35.0 - 47.0 %     Recent Labs    07/09/17 1909 07/10/17 0450  07/10/17 2305 07/11/17 0502 07/11/17 0841  WBC 5.6 5.3  --   --  6.6  --   HGB 8.4* 7.0*   < > 8.8* 9.5* 9.7*  HCT 24.2* 20.1*   < > 25.1* 26.8* 27.6*  PLT 228 180  --   --  171  --    < > = values in this interval not displayed.   BMET Recent Labs    07/09/17 0400 07/11/17 0502  NA 135 136  K 3.7 3.4*  CL 104 105  CO2 25 25  GLUCOSE 102* 102*  BUN 21* 7  CREATININE 0.48 0.54  CALCIUM 8.1* 7.9*   LFT No results for input(s): PROT, ALBUMIN, AST, ALT, ALKPHOS, BILITOT, BILIDIR, IBILI in the last 72 hours. PT/INR Recent Labs    07/10/17 1131  LABPROT 13.1  INR 1.00   Hepatitis Panel No results for input(s): HEPBSAG, HCVAB, HEPAIGM, HEPBIGM in the last 72 hours. C-Diff No results for input(s): CDIFFTOX in the last 72 hours. No results for input(s): CDIFFPCR in the last 72 hours.   Studies/Results: No results found.  Scheduled Inpatient Medications:   . enalapril  20 mg Oral Daily  . famotidine  20 mg Oral BID  . fluticasone  2 spray Each Nare Daily  . hydrochlorothiazide  25 mg Oral Daily  . levothyroxine  25 mcg Oral QAC breakfast     Continuous Inpatient Infusions:    PRN Inpatient Medications:  acetaminophen **OR** acetaminophen, albuterol, HYDROcodone-acetaminophen, ondansetron **OR** ondansetron (ZOFRAN) IV  Miscellaneous: not applicable  Assessment:  1. Presume diverticular bleed, resolved-patient's hemoglobin is stable although mildly low after 4 units packed red blood cells transfusion.  Plan:  1. Advance diet. 2. Discharge home in a.m. If no further bleeding. 3. Advised patient followed in the office in 6 months or sooner if needed. She will need a complete colonoscopy about 1 year for a fair prep on her colonoscopy this time as an inpatient.  Teodoro K. Alice Reichert, M.D. 07/11/2017, 4:28 PM

## 2017-07-11 NOTE — Progress Notes (Signed)
Pt arrived to room 156. Pt up to bathroom with standby assist. Pt on room air. IV infusing NS. Pt oriented to unit. Call bell and phone within reach. No complaints or needs at this time.

## 2017-07-11 NOTE — Progress Notes (Signed)
Follow up - Critical Care Medicine Note  Patient Details:    Julie Castillo is an 62 y.o. female.with a past medical history remarkable for hypertension, gastroesophageal reflux disease, hypothyroidism, pneumonia who presented to the emergency department on 5/16 after noticing maroon bloody stool, syncopal episode with 3 additional episodes of brighter red blood per rectum.  S/P EGD and Colonoscopy.  Her EGD was essentially negative however she had bleeding which was felt to be most likely left-sided however unable to identify the source. .  She subsequently was transferred to the intensive care unit for further evaluation and management.   Lines, Airways, Drains:    Anti-infectives:  Anti-infectives (From admission, onward)   None      Microbiology: Results for orders placed or performed during the hospital encounter of 07/08/17  MRSA PCR Screening     Status: None   Collection Time: 07/10/17 11:54 AM  Result Value Ref Range Status   MRSA by PCR NEGATIVE NEGATIVE Final    Comment:        The GeneXpert MRSA Assay (FDA approved for NASAL specimens only), is one component of a comprehensive MRSA colonization surveillance program. It is not intended to diagnose MRSA infection nor to guide or monitor treatment for MRSA infections. Performed at [facility], [address], [place], [place] [address]    Studies: No results found.  Consults: Treatment Team:  Efrain Sella, MD   Subjective:    Overnight Issues: overnight patient did well. No further evidence of active bleeding. Most recent hemoglobin was 9.5  Objective:  Vital signs for last 24 hours: Temp:  [96.6 F (35.9 C)-98.5 F (36.9 C)] 98.1 F (36.7 C) (05/19 0653) Pulse Rate:  [69-109] 82 (05/19 0700) Resp:  [10-23] 22 (05/19 0700) BP: (106-164)/(57-114) 154/114 (05/19 0700) SpO2:  [98 %-100 %] 100 % (05/19 0700) Weight:  [158 lb (71.7 kg)-161 lb 9.6 oz (73.3 kg)] 161 lb 9.6 oz (73.3 kg) (05/18  1104)  Hemodynamic parameters for last 24 hours:    Intake/Output from previous day: 05/18 0701 - 05/19 0700 In: 4863.3 [I.V.:3803.3; Blood:960; IV Piggyback:100] Out: 2525 [Urine:2525]  Intake/Output this shift: No intake/output data recorded.  Vent settings for last 24 hours:    Physical Exam:   Patient is awake, alert in no acute distress Vital signs: Please see the above listed vital signs Cardiovascular: Regular rate and rhythm Pulmonary: Clear to auscultation Abdominal: Positive bowel sounds soft exam Extremities: No clubbing cyanosis or edema noted  Assessment/Plan:   Lower intestinal bleeding. Status post EGD and colonoscopy.  Presently hemodynamics are stable.  Has received 3 units of packed red blood cells has had no additional bleeding noted overnight. No bowel movements noted area last hemoglobin was 9.5 with stable hemodynamics  Hypokalemia. We'll replace.  Will progress diet as tolerated, stable for floor transfer  Marilin Kofman 07/11/2017  *Care during the described time interval was provided by me and/or other providers on the critical care team.  I have reviewed this patient's available data, including medical history, events of note, physical examination and test results as part of my evaluation.

## 2017-07-11 NOTE — Progress Notes (Addendum)
Pt to be transferred to unit 1A room 156 today. Pt/family is aware and agreeable to transfer. Assessment is unchanged from this AM. Receiving RN is aware of transfer and has received report. All belongings sent with pt. VSS: BP 140/86   Pulse 89   Temp 98.1 F (36.7 C) (Oral)   Resp 19   Ht 5\' 3"  (1.6 m)   Wt 73.3 kg (161 lb 9.6 oz)   SpO2 100%   BMI 28.63 kg/m

## 2017-07-11 NOTE — Progress Notes (Signed)
PHARMACIST - PHYSICIAN COMMUNICATION  DR:   Margaretmary Eddy  CONCERNING: IV to Oral Route Change Policy  RECOMMENDATION: This patient is receiving famotiidne by the intravenous route.  Based on criteria approved by the Pharmacy and Therapeutics Committee, the intravenous medication(s) is/are being converted to the equivalent oral dose form(s).   DESCRIPTION: These criteria include:  The patient is eating (either orally or via tube) and/or has been taking other orally administered medications for a least 24 hours  The patient has no evidence of active gastrointestinal bleeding or impaired GI absorption (gastrectomy, short bowel, patient on TNA or NPO).  If you have questions about this conversion, please contact the Pharmacy Department  []   [phone] )  [facility] []   [phone] )  [facility] []   [phone] )  [facility] []   [phone] )  [facility] []   [phone] )  [place], [place].D, BCPS Clinical Pharmacist  07/11/2017 2:44 PM

## 2017-07-11 NOTE — Progress Notes (Signed)
[place] at [place] NAME: Julie Castillo    MR#:  [id number]  DATE OF BIRTH:  [birth year]  SUBJECTIVE:  CHIEF COMPLAINT: Patient is resting comfortably, tolerating diet, no abd pain, no other episodes of bleeding , no BM yet   REVIEW OF SYSTEMS:  CONSTITUTIONAL: No fever, fatigue or weakness.  EYES: No blurred or double vision.  EARS, NOSE, AND THROAT: No tinnitus or ear pain.  RESPIRATORY: No cough, shortness of breath, wheezing or hemoptysis.  CARDIOVASCULAR: No chest pain, orthopnea, edema.  GASTROINTESTINAL: No nausea, vomiting, diarrhea or abdominal pain.  Denies active lower GI bleed GENITOURINARY: No dysuria, hematuria.  ENDOCRINE: No polyuria, nocturia,  HEMATOLOGY: No anemia, easy bruising or bleeding SKIN: No rash or lesion. MUSCULOSKELETAL: No joint pain or arthritis.   NEUROLOGIC: No tingling, numbness, weakness.  PSYCHIATRY: No anxiety or depression.   DRUG ALLERGIES:   Allergies  Allergen Reactions  . Sulfa Antibiotics Hives  . Tape Other (See Comments) and Rash    Sensitive skin- can use paper tape bandaids- make skin red  Sensitive skin- can use paper tape    VITALS:  Blood pressure (!) 161/89, pulse 73, temperature 98.2 F (36.8 C), resp. rate 17, height 5\' 3"  (1.6 m), weight 73.3 kg (161 lb 9.6 oz), SpO2 100 %.  PHYSICAL EXAMINATION:  GENERAL:  62 y.o.-year-old patient lying in the bed with no acute distress.  EYES: Pupils equal, round, reactive to light and accommodation. No scleral icterus. Extraocular muscles intact.  HEENT: Head atraumatic, normocephalic. Oropharynx and nasopharynx clear.  NECK:  Supple, no jugular venous distention. No thyroid enlargement, no tenderness.  LUNGS: Normal breath sounds bilaterally, no wheezing, rales,rhonchi or crepitation. No use of accessory muscles of respiration.  CARDIOVASCULAR: S1, S2 normal. No murmurs, rubs, or gallops.  ABDOMEN: Soft, nontender, nondistended.  Bowel sounds present.  EXTREMITIES: No pedal edema, cyanosis, or clubbing.  NEUROLOGIC: Cranial nerves II through XII are intact. Muscle strength  global weakness in all extremities. Sensation intact. Gait not checked.  PSYCHIATRIC: The patient is alert and oriented x 3.  SKIN: No obvious rash, lesion, or ulcer.    LABORATORY PANEL:   CBC Recent Labs  Lab 07/11/17 0502 07/11/17 0841  WBC 6.6  --   HGB 9.5* 9.7*  HCT 26.8* 27.6*  PLT 171  --    ------------------------------------------------------------------------------------------------------------------  Chemistries  Recent Labs  Lab 07/08/17 1623 07/08/17 2237  07/11/17 0502  NA 130*  --    < > 136  K 3.3*  --    < > 3.4*  CL 97*  --    < > 105  CO2 25  --    < > 25  GLUCOSE 123*  --    < > 102*  BUN 29*  --    < > 7  CREATININE 0.73  --    < > 0.54  CALCIUM 8.3*  --    < > 7.9*  MG  --  1.9  --   --   AST 19  --   --   --   ALT 14  --   --   --   ALKPHOS 48  --   --   --   BILITOT 0.7  --   --   --    < > = values in this interval not displayed.   ------------------------------------------------------------------------------------------------------------------  Cardiac Enzymes No results for input(s): TROPONINI in the last 168 hours. ------------------------------------------------------------------------------------------------------------------  RADIOLOGY:  No results found.  EKG:   Orders placed or performed during the hospital encounter of 07/08/17  . ED EKG  . ED EKG  . EKG 12-Lead  . EKG 12-Lead  . EKG 12-Lead  . EKG 12-Lead    ASSESSMENT AND PLAN:   #Acute GI bleeding.  Diverticular- Patient had colonoscopy  5/18 by Dr. Alice Reichert which has revealed moderate diverticulosis in the entire colon and noticed profuse active bleeding coming from the diverticular opening, patient became significantly hypotensive requiring pressors and blood transfusion. Patient was transferred to intensive care unit  for close monitoring on 5/18  Transferred to floor 5/19 , no active bleed , hemodynamivally stable    IV fluids will be dced .Protonix IV twice daily  appreciate GI recommendations Check am labs  Diet education   # Symptomatic anemia. Check hemoglobin every 12 hours.  PRBC transfusion as needed.  #Dehydration and hyponatremia.  Normal saline IV and follow-up BMP better Resume HCTZ.  #Hypokalemia.  Give potassium and follow-up level.   # Hypertension.  resume BP meds  #Hypothyroidism continue Synthyroid      All the records are reviewed and case discussed with Care Management/Social Workerr. Management plans discussed with the patient, family and they are in agreement.  CODE STATUS: fc   TOTAL TIME TAKING CARE OF THIS PATIENT: 36 minutes.   POSSIBLE D/C IN 1  DAYS, DEPENDING ON CLINICAL CONDITION.  Note: This dictation was prepared with Dragon dictation along with smaller phrase technology. Any transcriptional errors that result from this process are unintentional.   Nicholes Mango M.D on 07/11/2017 at 12:38 PM  Between 7am to 6pm - Pager - [phone] After 6pm go to [email or web address] - password [email or web address] [facility]  [place]  Office  [phone]  CC: Primary care physician; Glean Hess, MD

## 2017-07-12 ENCOUNTER — Telehealth: Payer: Self-pay

## 2017-07-12 ENCOUNTER — Encounter: Payer: Self-pay | Admitting: Internal Medicine

## 2017-07-12 LAB — BASIC METABOLIC PANEL
ANION GAP: 8 (ref 5–15)
BUN: 9 mg/dL (ref 6–20)
CHLORIDE: 98 mmol/L — AB (ref 101–111)
CO2: 29 mmol/L (ref 22–32)
CREATININE: 0.65 mg/dL (ref 0.44–1.00)
Calcium: 8.5 mg/dL — ABNORMAL LOW (ref 8.9–10.3)
GFR calc non Af Amer: >60 mL/min (ref >60)
Glucose, Bld: 93 mg/dL (ref 65–99)
POTASSIUM: 3.4 mmol/L — AB (ref 3.5–5.1)
SODIUM: 135 mmol/L (ref 135–145)

## 2017-07-12 LAB — CBC
HEMATOCRIT: 27.8 % — AB (ref 35.0–47.0)
HEMOGLOBIN: 9.8 g/dL — AB (ref 12.0–16.0)
MCH: 30.5 pg (ref 26.0–34.0)
MCHC: 35.2 g/dL (ref 32.0–36.0)
MCV: 86.7 fL (ref 80.0–100.0)
Platelets: 193 10*3/uL (ref 150–440)
RBC: 3.21 MIL/uL — AB (ref 3.80–5.20)
RDW: 15.3 % — ABNORMAL HIGH (ref 11.5–14.5)
WBC: 5.9 10*3/uL (ref 3.6–11.0)

## 2017-07-12 LAB — HEMOGLOBIN AND HEMATOCRIT, BLOOD
HEMATOCRIT: 32.2 % — AB (ref 35.0–47.0)
HEMOGLOBIN: 11.2 g/dL — AB (ref 12.0–16.0)

## 2017-07-12 MED ORDER — ACETAMINOPHEN 325 MG PO TABS: 650.0000 mg | ORAL_TABLET | Freq: Four times a day (QID) | ORAL | Status: DC | PRN

## 2017-07-12 MED ORDER — POTASSIUM CHLORIDE CRYS ER 20 MEQ PO TBCR: 40.0000 meq | EXTENDED_RELEASE_TABLET | Freq: Once | ORAL | Status: DC

## 2017-07-12 MED ORDER — FERROUS SULFATE 325 (65 FE) MG PO TBEC: 325.0000 mg | DELAYED_RELEASE_TABLET | Freq: Every day | ORAL | 3 refills | Status: DC

## 2017-07-12 MED ORDER — DOCUSATE SODIUM 100 MG PO CAPS: 100.0000 mg | ORAL_CAPSULE | Freq: Two times a day (BID) | ORAL | 0 refills | Status: AC

## 2017-07-12 NOTE — Progress Notes (Signed)
Discharge summary reviewed with verbal understanding. Answered all questions. Provided education material and work included with AVS. 1 Rx given at discharge.

## 2017-07-12 NOTE — Plan of Care (Signed)
Nutrition Education Note  62 year old female with PMHx of arthritis, GERD, HTN, hypothyroidism who is admitted with presumed diverticular bleed now s/p transfusion of 4 units packed red blood cells.  RD consulted for nutrition education regarding diverticulosis and anemia.  Met with patient at bedside. She reports she had a good appetite and intake PTA. She typically eats 2-3 meals daily. She does eat fruit, vegetables, and whole grain bread products daily. She has heard that she needs to increase her fiber intake in setting of her diverticulosis. Discussed with patient that since she had active bleeding to wait approximately 1-2 weeks before beginning to increase her fiber intake to allow for adequate healing.  RD provided "High-Fiber Nutrition Therapy" handout from the Academy of [employer]. Encouraged slow increase in fiber intake to a goal of around 25-35 grams of fiber daily. Encouraged intake of whole grain products, fruits, and vegetables to increase fiber in diet. Encouraged adequate fluid intake of 8 cups per day with a high fiber diet to best process fiber and prevent constipation. Also reviewed "List of Foods High in Iron" handout from the [employer] of [employer]. Discussed that patient's RDA for iron is 8 mg daily (women 51+). Reviewed foods to eat to help reach RDA. Teach back method used.  Expect good compliance.  Body mass index is 28.63 kg/m. Pt meets criteria for overweight based on current BMI.  Current diet order is Heart Healthy, patient is consuming approximately 100% of meals at this time per her report. Labs and medications reviewed. No further nutrition interventions warranted at this time. RD contact information provided. If additional nutrition issues arise, please re-consult RD.  Willey Blade, MS, [place], LDN Office: [phone] Pager: [phone] After Hours/Weekend Pager: [phone]

## 2017-07-12 NOTE — Discharge Summary (Signed)
[place] at [place] NAME: Julie Castillo    MR#:  [id number]  DATE OF BIRTH:  [birth year]  DATE OF ADMISSION:  07/08/2017 ADMITTING PHYSICIAN: Demetrios Loll, MD  DATE OF DISCHARGE: 07/12/17  PRIMARY CARE PHYSICIAN: Glean Hess, MD    ADMISSION DIAGNOSIS:  Near syncope [R55] Gastrointestinal hemorrhage, unspecified gastrointestinal hemorrhage type [K92.2]  DISCHARGE DIAGNOSIS:  Active Problems:   GIB (gastrointestinal bleeding) Symptomatic anemia Diverticulosis  SECONDARY DIAGNOSIS:   Past Medical History:  Diagnosis Date  . Arthritis   . Edema    lower extremities after standing a long time   . Family history of adverse reaction to anesthesia    father- post op became violent   . Foot drop, right 2016   developed foot drop following RTKA in 2016; had to wear foot drop shoe following discharge ; per pat "i had pain in my ankle real bad but it ended up being dx as foot drop"  . GERD (gastroesophageal reflux disease)   . Hypertension   . Hypothyroidism   . Pneumonia    hx of   . PONV (postoperative nausea and vomiting)    post op day 1   . Varicose veins     HOSPITAL COURSE:   HPI  Julie Castillo  is a 62 y.o. female with a known history of multiple medical problems as below.  The patient presents the ED with above chief complaints.  She had one episode of mixed bright maroon bloody stool yesterday and a 3 episode of bloody stool today.  She feels dizzy and weak, almost passed out.  She denies any chest pain, shortness of breath or abdominal pain.  She had a colonoscopy 10 years ago which was normal.  She is not taking any blood thinner.    #Acute GI bleeding.  Diverticular- Patient had colonoscopy  5/18 by Dr. Alice Reichert which has revealed moderate diverticulosis in the entire colon and noticed profuse active bleeding coming from the diverticular opening, patient became significantly hypotensive requiring pressors and blood  transfusion. Patient was transferred to intensive care unit for close monitoring on 5/18  Transferred to floor 5/19 , no active bleed , hemodynamivally stable    IV fluids will be dced .Protonix IV twice daily change to PO   appreciate GI recommendations Hb- 11.2 , patient is started on iron supplements with stool softeners Diet education \ Outpatient follow-up with gastroenterology in 3 to 6 months and repeat colonoscopy in a year  # Symptomatic anemia. Status post blood transfusion, today's hemoglobin at 11.2   #Dehydration and hyponatremia. Normal saline IV andfollow-up BMP better Resume HCTZ.  #Hypokalemia. Give potassium and follow-up level.   # Hypertension. resume BP meds  #Hypothyroidism continue Synthyroid     DISCHARGE CONDITIONS:   fair  CONSULTS OBTAINED:     PROCEDURES colonoscopy  DRUG ALLERGIES:   Allergies  Allergen Reactions  . Sulfa Antibiotics Hives  . Tape Other (See Comments) and Rash    Sensitive skin- can use paper tape bandaids- make skin red  Sensitive skin- can use paper tape    DISCHARGE MEDICATIONS:   Allergies as of 07/12/2017      Reactions   Sulfa Antibiotics Hives   Tape Other (See Comments), Rash   Sensitive skin- can use paper tape bandaids- make skin red  Sensitive skin- can use paper tape      Medication List    TAKE these medications   acetaminophen 325 MG  tablet Commonly known as:  TYLENOL Take 2 tablets (650 mg total) by mouth every 6 (six) hours as needed for mild pain (or Fever >/= 101).   docusate sodium 100 MG capsule Commonly known as:  COLACE Take 1 capsule (100 mg total) by mouth 2 (two) times daily.   enalapril 20 MG tablet Commonly known as:  VASOTEC TAKE 1 TABLET BY MOUTH 2 TIMES DAILY.   ferrous sulfate 325 (65 FE) MG EC tablet Take 1 tablet (325 mg total) by mouth daily.   fluticasone 50 MCG/ACT nasal spray Commonly known as:  FLONASE PLACE 2 SPRAYS DAILY INTO BOTH NOSTRILS.    hydrochlorothiazide 25 MG tablet Commonly known as:  HYDRODIURIL TAKE 1 TABLET DAILY BY MOUTH.   levothyroxine 25 MCG tablet Commonly known as:  SYNTHROID, LEVOTHROID TAKE 1 TABLET (25 MCG TOTAL) BY MOUTH DAILY BEFORE BREAKFAST.   omeprazole 20 MG capsule Commonly known as:  PRILOSEC Take 1 capsule (20 mg total) by mouth 2 (two) times daily.   VITAMIN B 12 PO Take 1 tablet by mouth daily.   VITAMIN B6 PO Take 1 tablet by mouth daily.   Vitamin D 2000 units Caps Take 2,000 Units by mouth daily.        DISCHARGE INSTRUCTIONS:   Follow-up with primary care physician in a week Follow-up with gastroenterology in 3 to 6 months  DIET:  Cardiac, ldiverticular diet  DISCHARGE CONDITION:  Stable  ACTIVITY:  Activity as tolerated  OXYGEN:  Home Oxygen: No.   Oxygen Delivery: room air  DISCHARGE LOCATION:  home   If you experience worsening of your admission symptoms, develop shortness of breath, life threatening emergency, suicidal or homicidal thoughts you must seek medical attention immediately by calling 911 or calling your MD immediately  if symptoms less severe.  You Must read complete instructions/literature along with all the possible adverse reactions/side effects for all the Medicines you take and that have been prescribed to you. Take any new Medicines after you have completely understood and accpet all the possible adverse reactions/side effects.   Please note  You were cared for by a hospitalist during your hospital stay. If you have any questions about your discharge medications or the care you received while you were in the hospital after you are discharged, you can call the unit and asked to speak with the hospitalist on call if the hospitalist that took care of you is not available. Once you are discharged, your primary care physician will handle any further medical issues. Please note that NO REFILLS for any discharge medications will be authorized once  you are discharged, as it is imperative that you return to your primary care physician (or establish a relationship with a primary care physician if you do not have one) for your aftercare needs so that they can reassess your need for medications and monitor your lab values.     Today  Chief Complaint  Patient presents with  . Rectal Bleeding   Patient is feeling fine no abdominal pain.  Denies any chest pain or shortness of breath no other episodes of bleeding  ROS:  CONSTITUTIONAL: Denies fevers, chills. Denies any fatigue, weakness.  EYES: Denies blurry vision, double vision, eye pain. EARS, NOSE, THROAT: Denies tinnitus, ear pain, hearing loss. RESPIRATORY: Denies cough, wheeze, shortness of breath.  CARDIOVASCULAR: Denies chest pain, palpitations, edema.  GASTROINTESTINAL: Denies nausea, vomiting, diarrhea, abdominal pain. Denies bright red blood per rectum. GENITOURINARY: Denies dysuria, hematuria. ENDOCRINE: Denies nocturia or thyroid problems.  HEMATOLOGIC AND LYMPHATIC: Denies easy bruising or bleeding. SKIN: Denies rash or lesion. MUSCULOSKELETAL: Denies pain in neck, back, shoulder, knees, hips or arthritic symptoms.  NEUROLOGIC: Denies paralysis, paresthesias.  PSYCHIATRIC: Denies anxiety or depressive symptoms.   VITAL SIGNS:  Blood pressure 125/87, pulse 82, temperature 97.6 F (36.4 C), temperature source Oral, resp. rate 18, height 5\' 3"  (1.6 m), weight 73.3 kg (161 lb 9.6 oz), SpO2 100 %.  I/O:    Intake/Output Summary (Last 24 hours) at 07/12/2017 1126 Last data filed at 07/12/2017 0900 Gross per 24 hour  Intake 920 ml  Output -  Net 920 ml    PHYSICAL EXAMINATION:  GENERAL:  62 y.o.-year-old patient lying in the bed with no acute distress.  EYES: Pupils equal, round, reactive to light and accommodation. No scleral icterus. Extraocular muscles intact.  HEENT: Head atraumatic, normocephalic. Oropharynx and nasopharynx clear.  NECK:  Supple, no jugular  venous distention. No thyroid enlargement, no tenderness.  LUNGS: Normal breath sounds bilaterally, no wheezing, rales,rhonchi or crepitation. No use of accessory muscles of respiration.  CARDIOVASCULAR: S1, S2 normal. No murmurs, rubs, or gallops.  ABDOMEN: Soft, non-tender, non-distended. Bowel sounds present. No organomegaly or mass.  EXTREMITIES: No pedal edema, cyanosis, or clubbing.  NEUROLOGIC: Cranial nerves II through XII are intact. Muscle strength 5/5 in all extremities. Sensation intact. Gait not checked.  PSYCHIATRIC: The patient is alert and oriented x 3.  SKIN: No obvious rash, lesion, or ulcer.   DATA REVIEW:   CBC Recent Labs  Lab 07/12/17 0404 07/12/17 0901  WBC 5.9  --   HGB 9.8* 11.2*  HCT 27.8* 32.2*  PLT 193  --     Chemistries  Recent Labs  Lab 07/08/17 1623 07/08/17 2237  07/12/17 0404  NA 130*  --    < > 135  K 3.3*  --    < > 3.4*  CL 97*  --    < > 98*  CO2 25  --    < > 29  GLUCOSE 123*  --    < > 93  BUN 29*  --    < > 9  CREATININE 0.73  --    < > 0.65  CALCIUM 8.3*  --    < > 8.5*  MG  --  1.9  --   --   AST 19  --   --   --   ALT 14  --   --   --   ALKPHOS 48  --   --   --   BILITOT 0.7  --   --   --    < > = values in this interval not displayed.    Cardiac Enzymes No results for input(s): TROPONINI in the last 168 hours.  Microbiology Results  Results for orders placed or performed during the hospital encounter of 07/08/17  MRSA PCR Screening     Status: None   Collection Time: 07/10/17 11:54 AM  Result Value Ref Range Status   MRSA by PCR NEGATIVE NEGATIVE Final    Comment:        The GeneXpert MRSA Assay (FDA approved for NASAL specimens only), is one component of a comprehensive MRSA colonization surveillance program. It is not intended to diagnose MRSA infection nor to guide or monitor treatment for MRSA infections. Performed at [facility], [address] [place], [place] [address]     RADIOLOGY:   No results found.  EKG:   Orders placed or  performed during the hospital encounter of 07/08/17  . ED EKG  . ED EKG  . EKG 12-Lead  . EKG 12-Lead  . EKG 12-Lead  . EKG 12-Lead      Management plans discussed with the patient, family and they are in agreement.  CODE STATUS:     Code Status Orders  (From admission, onward)        Start     Ordered   07/08/17 2023  Full code  Continuous     07/08/17 2022    Code Status History    Date Active Date Inactive Code Status Order ID Comments User Context   10/19/2016 1014 10/20/2016 1855 Full Code [id number]  Norman Herrlich Inpatient   03/20/2014 1056 03/22/2014 1722 Full Code [id number]  BabishLucille Passy, PA-C Inpatient      TOTAL TIME TAKING CARE OF THIS PATIENT: 43 minutes.   Note: This dictation was prepared with Dragon dictation along with smaller phrase technology. Any transcriptional errors that result from this process are unintentional.   @[facility] @  on 07/12/2017 at 11:26 AM  Between 7am to 6pm - Pager - [phone]  After 6pm go to [email or web address] - password [email or web address] [email or web address]  [place] [facility]  Office  [phone]  CC: Primary care physician; Glean Hess, MD

## 2017-07-12 NOTE — Discharge Instructions (Signed)
Follow-up with primary care physician in a week Follow-up with gastroenterology in 3 to 6 months

## 2017-07-12 NOTE — Telephone Encounter (Signed)
Transition Care Management Follow-Up Telephone Call   Date discharged and where: 07/12/17 from [facility]  How have you been since you were released from the hospital?  States "still feels weak but to be expected". States she has "taken the rest of the week off from work to recover". Confirmed she has picked up her Rx's from the pharmacy. Recommended to take iron supplements with orange juice to assist with better iron absorption. Verbalized acceptance and understanding.  Any patient concerns? None  Items Reviewed: Verified = V   Meds: V  Allergies: V  Dietary Orders: Heart Healthy, diverticular diet. Verbalized acceptance and understanding about dietary limitations and restrictions.  Functional Questionnaire:  Independent = I Dependent = D  ADLs: I  Dressing- I   Eating- I  Maintaining continence- I  Transferring- I  Transportation- I  Meal Prep- I  Managing Meds- I  Additional Items Reviewed:  Hospital f/u appt confirmed? Yes. Scheduled to see Dr. Army Melia on 07/14/17 @ 3:15pm. Denies need for transportation arrangements.  If their condition worsens, is the pt aware to call PCP or go to the Emergency Dept.? Yes. Verbalized acceptance and understanding  Was the patient provided with contact information for the PCP's office or ED? Yes  Was to pt encouraged to call back with questions or concerns? Yes

## 2017-07-13 ENCOUNTER — Inpatient Hospital Stay: Payer: 59 | Admitting: Internal Medicine

## 2017-07-13 LAB — PREPARE RBC (CROSSMATCH)

## 2017-07-14 ENCOUNTER — Encounter: Payer: Self-pay | Admitting: Internal Medicine

## 2017-07-14 ENCOUNTER — Ambulatory Visit: Payer: 59 | Admitting: Internal Medicine

## 2017-07-14 VITALS — BP 94/62 | HR 101 | Resp 16 | Ht 63.0 in | Wt 149.0 lb

## 2017-07-14 DIAGNOSIS — I10 Essential (primary) hypertension: Secondary | ICD-10-CM | POA: Diagnosis not present

## 2017-07-14 DIAGNOSIS — R0683 Snoring: Secondary | ICD-10-CM

## 2017-07-14 DIAGNOSIS — K5793 Diverticulitis of intestine, part unspecified, without perforation or abscess with bleeding: Secondary | ICD-10-CM | POA: Diagnosis not present

## 2017-07-14 NOTE — Progress Notes (Signed)
Date:  07/14/2017   Name:  Julie Castillo   DOB:  Jan 29, 1956   MRN:  [id number]   Chief Complaint: Hospitalization Follow-up (Admitted 07/08/2017 D/C 07/12/2017 Diverticulitis ) Patient hospitalized at [facility] 07/08/17 to 07/12/17.  She received a TOC call on 07/12/17.  D/C summary: Active Problems:   GIB (gastrointestinal bleeding) Symptomatic anemia Diverticulosis She was transfused with 3 units and monitored briefly in the ICU. Colonoscopy showed multiple diverticuli and active bleeding but no definite site was identified.  EGD was done as well as was normal.  Since being home she has not had any further bleeding.  Yesterday had a small normal stool. She was sent home on stool softener and iron supplement. The colonoscopy was sub-optimal prep but no obvious lesions other than diverticulosis was seen. It was recommended to repeat it in one year. She was also told to follow up with GI in 4-6 months but for unclear reasons.  Review of Systems  Constitutional: Positive for fatigue. Negative for chills, diaphoresis and fever.  Respiratory: Negative for chest tightness, shortness of breath and wheezing.   Cardiovascular: Negative for chest pain and palpitations.  Gastrointestinal: Negative for abdominal pain, anal bleeding, blood in stool and constipation.  Neurological: Negative for dizziness and headaches.    Patient Active Problem List   Diagnosis Date Noted  . GIB (gastrointestinal bleeding) 07/08/2017  . Vitreous floaters of right eye 01/29/2017  . 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 Mixed hyperlipidemia 10/02/2016  . Lower extremity edema 03/27/2016  . Vitamin D deficiency 03/27/2016  . Seasonal allergies 03/27/2016  . Ptosis of eyelid, left 03/27/2016  . Essential hypertension 03/22/2015  . Gastroesophageal reflux disease without esophagitis 03/22/2015  . Hypothyroidism due to acquired atrophy of thyroid 03/22/2015  . S/P right TKA 03/20/2014    Prior to Admission medications     Medication Sig Start Date End Date Taking? Authorizing Provider  acetaminophen (TYLENOL) 325 MG tablet Take 2 tablets (650 mg total) by mouth every 6 (six) hours as needed for mild pain (or Fever >/= 101). 07/12/17   Nicholes Mango, MD  Cholecalciferol (VITAMIN D) 2000 units CAPS Take 2,000 Units by mouth daily.    [provider]  Cyanocobalamin (VITAMIN B 12 PO) Take 1 tablet by mouth daily.    [provider]  docusate sodium (COLACE) 100 MG capsule Take 1 capsule (100 mg total) by mouth 2 (two) times daily. 07/12/17   Gouru, Illene Silver, MD  enalapril (VASOTEC) 20 MG tablet TAKE 1 TABLET BY MOUTH 2 TIMES DAILY. 04/12/17   Glean Hess, MD  ferrous sulfate 325 (65 FE) MG EC tablet Take 1 tablet (325 mg total) by mouth daily. 07/12/17 10/10/17  Gouru, Illene Silver, MD  fluticasone (FLONASE) 50 MCG/ACT nasal spray PLACE 2 SPRAYS DAILY INTO BOTH NOSTRILS. 07/05/17   Glean Hess, MD  hydrochlorothiazide (HYDRODIURIL) 25 MG tablet TAKE 1 TABLET DAILY BY MOUTH. 04/12/17   Glean Hess, MD  levothyroxine (SYNTHROID, LEVOTHROID) 25 MCG tablet TAKE 1 TABLET (25 MCG TOTAL) BY MOUTH DAILY BEFORE BREAKFAST. 07/05/17   Glean Hess, MD  omeprazole (PRILOSEC) 20 MG capsule Take 1 capsule (20 mg total) by mouth 2 (two) times daily. 01/29/17   Glean Hess, MD  Pyridoxine HCl (VITAMIN B6 PO) Take 1 tablet by mouth daily.    [provider]    Allergies  Allergen Reactions  . Sulfa Antibiotics Hives  . Tape Other (See Comments) and Rash    Sensitive  skin- can use paper tape bandaids- make skin red  Sensitive skin- can use paper tape    Past Surgical History:  Procedure Laterality Date  . BREAST CYST ASPIRATION Left 1988   "it just a fineneedle aspiration"   . COLONOSCOPY N/A 07/10/2017   Procedure: COLONOSCOPY;  Surgeon: Toledo, Benay Pike, MD;  Location: [facility] ENDOSCOPY;  Service: Gastroenterology;  Laterality: N/A;  . ESOPHAGOGASTRODUODENOSCOPY N/A 07/10/2017   Procedure:  ESOPHAGOGASTRODUODENOSCOPY (EGD);  Surgeon: Toledo, Benay Pike, MD;  Location: [facility] ENDOSCOPY;  Service: Gastroenterology;  Laterality: N/A;  . EYE SURGERY  2001,2005   left eye surgery x 2 ( eyelid)   . 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 TOTAL KNEE ARTHROPLASTY Left 10/19/2016   Procedure: LEFT TOTAL KNEE ARTHROPLASTY;  Surgeon: Paralee Cancel, MD;  Location: [facility];  Service: Orthopedics;  Laterality: Left;    Social History   Tobacco Use  . Smoking status: Never Smoker  . Smokeless tobacco: Never Used  Substance Use Topics  . Alcohol use: Yes    Comment: occasional   . Drug use: No     Medication list has been reviewed and updated.  Current Meds  Medication Sig  . acetaminophen (TYLENOL) 325 MG tablet Take 2 tablets (650 mg total) by mouth every 6 (six) hours as needed for mild pain (or Fever >/= 101).  . Cholecalciferol (VITAMIN D) 2000 units CAPS Take 2,000 Units by mouth daily.  . Cyanocobalamin (VITAMIN B 12 PO) Take 1 tablet by mouth daily.  . enalapril (VASOTEC) 20 MG tablet TAKE 1 TABLET BY MOUTH 2 TIMES DAILY.  . ferrous sulfate 325 (65 FE) MG EC tablet Take 1 tablet (325 mg total) by mouth daily.  . fluticasone (FLONASE) 50 MCG/ACT nasal spray PLACE 2 SPRAYS DAILY INTO BOTH NOSTRILS.  . hydrochlorothiazide (HYDRODIURIL) 25 MG tablet TAKE 1 TABLET DAILY BY MOUTH.  Marland Kitchen levothyroxine (SYNTHROID, LEVOTHROID) 25 MCG tablet TAKE 1 TABLET (25 MCG TOTAL) BY MOUTH DAILY BEFORE BREAKFAST.  Marland Kitchen omeprazole (PRILOSEC) 20 MG capsule Take 1 capsule (20 mg total) by mouth 2 (two) times daily.  . Pyridoxine HCl (VITAMIN B6 PO) Take 1 tablet by mouth daily.    PHQ 2/9 Scores 10/02/2016  PHQ - 2 Score 0    Physical Exam  Constitutional: She is oriented to person, place, and time. She appears well-developed and well-nourished.  Neck: Normal range  of motion. Neck supple.  Cardiovascular: Normal rate, regular rhythm and normal heart sounds.  Pulmonary/Chest: Effort normal and breath sounds normal.  Lymphadenopathy:    She has no cervical adenopathy.  Neurological: She is alert and oriented to person, place, and time.  Psychiatric: She has a normal mood and affect. Her behavior is normal.    BP 94/62   Pulse (!) 101   Resp 16   Ht 5\' 3"  (1.6 m)   Wt 149 lb (67.6 kg)   SpO2 100%   BMI 26.39 kg/m   Assessment and Plan: 1. Gastrointestinal hemorrhage associated with intestinal diverticulitis Continue Fe Start stool softener  2. Essential hypertension Reduce vasotec to once a day   No orders of the defined types were placed in this encounter.   Partially dictated using Editor, commissioning. Any errors are unintentional.  Halina Maidens, MD [facility]  07/14/2017  No orders of the defined types were placed in  this encounter.   Partially dictated using Editor, commissioning. Any errors are unintentional.  Halina Maidens, MD [facility]  07/14/2017

## 2017-07-14 NOTE — Patient Instructions (Signed)
Reduce Vasotec to once per day.  Continue Iron and start the stool softener

## 2017-07-22 ENCOUNTER — Other Ambulatory Visit: Payer: Self-pay | Admitting: *Deleted

## 2017-07-22 NOTE — Patient Outreach (Addendum)
[place] [facility]) Care Management  07/22/2017  Julie Castillo [birth year] [id number]   Subjective: Received voicemail message from Julie Castillo, states is off from work, and request call back.   Telephone call to patient's home / mobile number, spoke with patient, and HIPAA verified.  Discussed [facility] Care Management UMR Transition of care follow up, patient voiced understanding, and is in agreement to follow up.   Patient states she is doing fine, much better, has received nutritional education while inpatient, modified diet due to new diagnosis of diverticulitis, managing modified diet without difficulty, returned to work on 07/20/17, had follow up appointment with primary MD on 07/14/17, and appointment went well.   States she has her annual physical on 08/06/17 with primary MD.  Patient voices understanding of medical diagnosis and treatment plan.  Patient states she is able to manage self care and has assistance as needed with activities of daily living / home management.  States she is accessing the following [facility] benefits: outpatient pharmacy, hospital indemnity, and has family medical leave act (FMLA) in place.  Patient states she does not have any education material, transition of care, care coordination, disease management, disease monitoring, transportation, community resource, or pharmacy needs at this time.  States she is very appreciative of the follow up and is in agreement to receive [place] Management information.      Objective: Per KPN (Knowledge Performance Now, point of care tool) and chart review, patient hospitalized 07/08/17 -07/12/17 for GIB (gastrointestinal bleeding), and Symptomatic anemia.    Patient also has a history of Diverticulosis, hypertension, arthritis, and Hypothyroidism.    Primary care MD's office completed transition of care follow up on 07/12/17.       Assessment:  Received UMR Transition of care referral on 07/09/17. Transition of care follow up  completed, no care management needs, and will proceed with case closure.      Plan: RNCM will send patient successful outreach letter, [facility] pamphlet, and magnet. RNCM will complete case closure due to follow up completed / no care management needs.         Allyse Fregeau H. Annia Friendly, BSN, [place] Management [facility] Telephonic CM Phone: [phone] Fax: [phone]

## 2017-07-22 NOTE — Patient Outreach (Signed)
[place] [facility]) Care Management  07/22/2017  Julie Castillo 05-Aug-1955 [id number]   Subjective: Telephone call to patient's home / mobile number, spoke with patient, and HIPAA verified.  Discussed [facility] Care Management UMR Transition of care follow up, patient voiced understanding, and is in agreement to follow up.   Patient states she is doing better, currently working, and requested call back.    Objective: Per KPN (Knowledge Performance Now, point of care tool) and chart review, patient hospitalized 07/08/17 -07/12/17 for GIB (gastrointestinal bleeding), and Symptomatic anemia.    Patient also has a history of Diverticulosis, hypertension, arthritis, and Hypothyroidism.        Assessment:  Received UMR Transition of care referral on 07/09/17.  Transition of care follow up pending patient contact.      Plan: RNCM will send unsuccessful outreach  letter, [facility] pamphlet, will call patient for 2nd telephone outreach attempt, transition of care follow up, and proceed with case closure, within 10 business days if no return call.       Julie Castillo H. Annia Friendly, BSN, [place] Management [facility] Telephonic CM Phone: [phone] Fax: [phone]

## 2017-07-23 ENCOUNTER — Ambulatory Visit: Payer: Self-pay | Admitting: *Deleted

## 2017-07-27 ENCOUNTER — Other Ambulatory Visit: Payer: Self-pay | Admitting: *Deleted

## 2017-07-27 NOTE — Patient Outreach (Signed)
[facility] [facility]) Care Management  07/27/2017  Julie Castillo [birth year] [id number]   Subjective: Received voicemail message from Julie Castillo, states she has some questions, and requested call back. Telephone call to patient's home  / mobile number, spoke with patient, states she is currently with a patient and will call back at a later time.    Objective:Per KPN (Knowledge Performance Now, point of care tool) and chart review,patient hospitalized 07/08/17 -07/12/17 forGIB (gastrointestinal bleeding), andSymptomatic anemia. Patient also has a history of Diverticulosis, hypertension, arthritis, andHypothyroidism.  Primary care MD's office completed transition of care follow up on 07/12/17.       Assessment: Received UMR Transition of care referral on 07/09/17.Transition of care follow up completed, no care management needs, and case closed on 07/22/17.      Plan:Case will remain and patient will contact this RNCM with any further needs.         Julie Castillo H. Annia Friendly, BSN, [place] Management [facility] Telephonic CM Phone: [phone] Fax: [phone]

## 2017-07-27 NOTE — Patient Outreach (Signed)
[facility]) Care Management  07/27/2017  Julie Castillo 02/23/1956 [id number]   Telephone call from patient's home / mobile number, spoke with patient, and HIPAA verified.  States she remember speaking with this RNCM last week and has some additional questions.  States she spoke with [employer] verified she has hospital indemnity and will file claim if appropriate.   RNCM verbally gave patient contact number for [facility] Patient Accounting [phone] to request itemized bill, per her request.   States she is very appreciative of the follow up and will call RNCM if she has any additional questions.  No further care management needs and case will remain closed.    [name] H. Annia Friendly, BSN, [place] Management [facility] Telephonic CM Phone: [phone] Fax: [phone]

## 2017-08-06 ENCOUNTER — Encounter: Payer: Self-pay | Admitting: Internal Medicine

## 2017-08-06 ENCOUNTER — Ambulatory Visit (INDEPENDENT_AMBULATORY_CARE_PROVIDER_SITE_OTHER): Payer: 59 | Admitting: Internal Medicine

## 2017-08-06 VITALS — BP 132/82 | HR 78 | Resp 16 | Ht 63.0 in | Wt 157.0 lb

## 2017-08-06 DIAGNOSIS — J3489 Other specified disorders of nose and nasal sinuses: Secondary | ICD-10-CM | POA: Diagnosis not present

## 2017-08-06 DIAGNOSIS — Z0000 Encounter for general adult medical examination without abnormal findings: Secondary | ICD-10-CM

## 2017-08-06 DIAGNOSIS — I10 Essential (primary) hypertension: Secondary | ICD-10-CM

## 2017-08-06 DIAGNOSIS — K5793 Diverticulitis of intestine, part unspecified, without perforation or abscess with bleeding: Secondary | ICD-10-CM | POA: Diagnosis not present

## 2017-08-06 DIAGNOSIS — E559 Vitamin D deficiency, unspecified: Secondary | ICD-10-CM | POA: Diagnosis not present

## 2017-08-06 DIAGNOSIS — E034 Atrophy of thyroid (acquired): Secondary | ICD-10-CM

## 2017-08-06 DIAGNOSIS — D649 Anemia, unspecified: Secondary | ICD-10-CM | POA: Diagnosis not present

## 2017-08-06 DIAGNOSIS — E782 Mixed hyperlipidemia: Secondary | ICD-10-CM | POA: Diagnosis not present

## 2017-08-06 LAB — POCT URINALYSIS DIPSTICK
BILIRUBIN UA: NEGATIVE
Blood, UA: NEGATIVE
Glucose, UA: NEGATIVE
Ketones, UA: NEGATIVE
Leukocytes, UA: NEGATIVE
NITRITE UA: NEGATIVE
PH UA: 7.5 (ref 5.0–8.0)
PROTEIN UA: NEGATIVE
Spec Grav, UA: 1.02 (ref 1.010–1.025)
UROBILINOGEN UA: 0.2 U/dL

## 2017-08-06 NOTE — Progress Notes (Signed)
Date:  08/06/2017   Name:  Julie Castillo   DOB:  21-Jul-1955   MRN:  [id number]   Chief Complaint: Annual Exam Julie Castillo is a 62 y.o. female who presents today for her Complete Annual Exam. She feels well. She reports exercising walking mostly at work. She reports she is sleeping well. Mammogram done in December.  Hypertension  This is a chronic problem. The problem is unchanged. The problem is controlled. Pertinent negatives include no chest pain, headaches, palpitations or shortness of breath. Past treatments include diuretics and ACE inhibitors. Identifiable causes of hypertension include a thyroid problem.  Thyroid Problem  Presents for follow-up visit. Patient reports no anxiety, constipation, diarrhea, fatigue, palpitations or tremors.  Cough  This is a new problem. The current episode started in the past 7 days. The problem has been unchanged. The problem occurs every few hours. The cough is non-productive. Associated symptoms include postnasal drip. Pertinent negatives include no chest pain, chills, fever, headaches, rash, shortness of breath or wheezing. Associated symptoms comments: hoarseness.   GI bleed due to diverticulitis - she has recovered with no further abd pain or bleeding.  Eating normally.  Avoiding any further advil.  Still taking iron - would like to stop if able.   Review of Systems  Constitutional: Negative for chills, fatigue and fever.  HENT: Positive for postnasal drip and voice change. Negative for congestion, hearing loss, tinnitus and trouble swallowing.   Eyes: Negative for visual disturbance.  Respiratory: Positive for cough. Negative for chest tightness, shortness of breath and wheezing.   Cardiovascular: Negative for chest pain, palpitations and leg swelling.  Gastrointestinal: Negative for abdominal pain, constipation, diarrhea and vomiting.  Endocrine: Negative for polydipsia and polyuria.  Genitourinary: Negative for dysuria, frequency, genital  sores, vaginal bleeding and vaginal discharge.  Musculoskeletal: Negative for arthralgias, gait problem and joint swelling.  Skin: Negative for color change and rash.  Neurological: Negative for dizziness, tremors, light-headedness and headaches.  Hematological: Negative for adenopathy. Does not bruise/bleed easily.  Psychiatric/Behavioral: Negative for dysphoric mood and sleep disturbance. The patient is not nervous/anxious.     Patient Active Problem List   Diagnosis Date Noted  . Snoring 07/14/2017  . GIB (gastrointestinal bleeding) 07/08/2017  . Vitreous floaters of right eye 01/29/2017  . 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 Mixed hyperlipidemia 10/02/2016  . Lower extremity edema 03/27/2016  . Vitamin D deficiency 03/27/2016  . Seasonal allergies 03/27/2016  . Ptosis of eyelid, left 03/27/2016  . Essential hypertension 03/22/2015  . Gastroesophageal reflux disease without esophagitis 03/22/2015  . Hypothyroidism due to acquired atrophy of thyroid 03/22/2015  . S/P right TKA 03/20/2014    Prior to Admission medications   Medication Sig Start Date End Date Taking? Authorizing Provider  acetaminophen (TYLENOL) 325 MG tablet Take 2 tablets (650 mg total) by mouth every 6 (six) hours as needed for mild pain (or Fever >/= 101). Patient not taking: Reported on 07/22/2017 07/12/17   Nicholes Mango, MD  Cholecalciferol (VITAMIN D) 2000 units CAPS Take 2,000 Units by mouth daily.    [provider]  Cyanocobalamin (VITAMIN B 12 PO) Take 1 tablet by mouth daily.    [provider]  docusate sodium (COLACE) 100 MG capsule Take 1 capsule (100 mg total) by mouth 2 (two) times daily. 07/12/17   Gouru, Illene Silver, MD  enalapril (VASOTEC) 20 MG tablet TAKE 1 TABLET BY MOUTH 2 TIMES DAILY. 04/12/17   Glean Hess, MD  ferrous sulfate  325 (65 FE) MG EC tablet Take 1 tablet (325 mg total) by mouth daily. 07/12/17 10/10/17  Gouru, Illene Silver, MD  fluticasone (FLONASE) 50 MCG/ACT nasal spray PLACE 2  SPRAYS DAILY INTO BOTH NOSTRILS. 07/05/17   Glean Hess, MD  hydrochlorothiazide (HYDRODIURIL) 25 MG tablet TAKE 1 TABLET DAILY BY MOUTH. 04/12/17   Glean Hess, MD  levothyroxine (SYNTHROID, LEVOTHROID) 25 MCG tablet TAKE 1 TABLET (25 MCG TOTAL) BY MOUTH DAILY BEFORE BREAKFAST. 07/05/17   Glean Hess, MD  omeprazole (PRILOSEC) 20 MG capsule Take 1 capsule (20 mg total) by mouth 2 (two) times daily. 01/29/17   Glean Hess, MD  Pyridoxine HCl (VITAMIN B6 PO) Take 1 tablet by mouth daily.    [provider]    Allergies  Allergen Reactions  . Sulfa Antibiotics Hives  . Tape Other (See Comments) and Rash    Sensitive skin- can use paper tape bandaids- make skin red  Sensitive skin- can use paper tape    Past Surgical History:  Procedure Laterality Date  . BREAST CYST ASPIRATION Left 1988   "it just a fineneedle aspiration"   . COLONOSCOPY N/A 07/10/2017   Procedure: COLONOSCOPY;  Surgeon: Toledo, Benay Pike, MD;  Location: [facility] ENDOSCOPY;  Service: Gastroenterology;  Laterality: N/A;  . ESOPHAGOGASTRODUODENOSCOPY N/A 07/10/2017   Procedure: ESOPHAGOGASTRODUODENOSCOPY (EGD);  Surgeon: Toledo, Benay Pike, MD;  Location: [facility] ENDOSCOPY;  Service: Gastroenterology;  Laterality: N/A;  . EYE SURGERY  2001,2005   left eye surgery x 2 ( eyelid)   . 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 TOTAL KNEE ARTHROPLASTY Left 10/19/2016   Procedure: LEFT TOTAL KNEE ARTHROPLASTY;  Surgeon: Paralee Cancel, MD;  Location: [facility];  Service: Orthopedics;  Laterality: Left;    Social History   Tobacco Use  . Smoking status: Never Smoker  . Smokeless tobacco: Never Used  Substance Use Topics  . Alcohol use: Yes    Comment: occasional   . Drug use: No     Medication list has been reviewed and updated.  Current Meds  Medication Sig  .  acetaminophen (TYLENOL) 325 MG tablet Take 2 tablets (650 mg total) by mouth every 6 (six) hours as needed for mild pain (or Fever >/= 101).  . Cholecalciferol (VITAMIN D) 2000 units CAPS Take 2,000 Units by mouth daily.  . Cyanocobalamin (VITAMIN B 12 PO) Take 1 tablet by mouth daily.  Marland Kitchen docusate sodium (COLACE) 100 MG capsule Take 1 capsule (100 mg total) by mouth 2 (two) times daily.  . enalapril (VASOTEC) 20 MG tablet TAKE 1 TABLET BY MOUTH 2 TIMES DAILY. (Patient taking differently: 1 tab daily)  . ferrous sulfate 325 (65 FE) MG EC tablet Take 1 tablet (325 mg total) by mouth daily.  . fluticasone (FLONASE) 50 MCG/ACT nasal spray PLACE 2 SPRAYS DAILY INTO BOTH NOSTRILS.  . hydrochlorothiazide (HYDRODIURIL) 25 MG tablet TAKE 1 TABLET DAILY BY MOUTH.  Marland Kitchen levothyroxine (SYNTHROID, LEVOTHROID) 25 MCG tablet TAKE 1 TABLET (25 MCG TOTAL) BY MOUTH DAILY BEFORE BREAKFAST.  Marland Kitchen omeprazole (PRILOSEC) 20 MG capsule Take 1 capsule (20 mg total) by mouth 2 (two) times daily.  . Pyridoxine HCl (VITAMIN B6 PO) Take 1 tablet by mouth daily.  . [DISCONTINUED] phentermine (ADIPEX-P) 37.5 MG tablet Take 37.5 mg by mouth daily.    PHQ 2/9 Scores 08/06/2017 08/06/2017 10/02/2016  PHQ -  2 Score 0 0 0  PHQ- 9 Score - 0 -    Physical Exam  Constitutional: She is oriented to person, place, and time. She appears well-developed and well-nourished. No distress.  HENT:  Head: Normocephalic and atraumatic.  Right Ear: Tympanic membrane and ear canal normal.  Left Ear: Tympanic membrane and ear canal normal.  Nose: Right sinus exhibits no maxillary sinus tenderness. Left sinus exhibits no maxillary sinus tenderness.  Mouth/Throat: Uvula is midline and oropharynx is clear and moist.  Eyes: Conjunctivae and EOM are normal. Right eye exhibits no discharge. Left eye exhibits no discharge. No scleral icterus.  Neck: Normal range of motion. Carotid bruit is not present. No erythema present. No thyromegaly present.    Cardiovascular: Normal rate, regular rhythm, normal heart sounds and normal pulses.  Pulmonary/Chest: Effort normal. No respiratory distress. She has no wheezes. Right breast exhibits no mass, no nipple discharge, no skin change and no tenderness. Left breast exhibits no mass, no nipple discharge, no skin change and no tenderness.  Abdominal: Soft. Bowel sounds are normal. There is no hepatosplenomegaly. There is no tenderness. There is no CVA tenderness.  Musculoskeletal: Normal range of motion.  Lymphadenopathy:    She has no cervical adenopathy.    She has no axillary adenopathy.  Neurological: She is alert and oriented to person, place, and time. She has normal reflexes. No cranial nerve deficit or sensory deficit.  Skin: Skin is warm, dry and intact. No rash noted.  Psychiatric: She has a normal mood and affect. Her speech is normal and behavior is normal. Thought content normal.  Nursing note and vitals reviewed.   BP 132/82   Pulse 78   Resp 16   Ht 5\' 3"  (1.6 m)   Wt 157 lb (71.2 kg)   SpO2 100%   BMI 27.81 kg/m   Assessment and Plan: 1. Annual physical exam Normal exam except for weight Continue diet and exercise - POCT urinalysis dipstick  2. Essential hypertension controlled - CBC with Differential/Platelet  3. Hypothyroidism due to acquired atrophy of thyroid supplemented - TSH  4. Gastrointestinal hemorrhage associated with intestinal diverticulitis No further bleeding or pain Avoiding all nsaids Will advise on iron supplement after labs reviewed - CBC with Differential/Platelet  5. Mixed hyperlipidemia Check labs and advise - Comprehensive metabolic panel - Lipid panel  6. Vitamin D deficiency Continue supplementation - VITAMIN D 25 Hydroxy (Vit-D Deficiency, Fractures)  7. Sinus drainage Continue Claritin and Flonase, Delsym for cough Follow up if worsening   No orders of the defined types were placed in this encounter.   Partially dictated  using Editor, commissioning. Any errors are unintentional.  Halina Maidens, MD [facility]  08/06/2017

## 2017-08-06 NOTE — Patient Instructions (Signed)
Take claritin along with Flonase for cough and drainage Delsym cough syrup

## 2017-08-07 LAB — COMPREHENSIVE METABOLIC PANEL
A/G RATIO: 2 (ref 1.2–2.2)
ALT: 10 IU/L (ref 0–32)
AST: 14 IU/L (ref 0–40)
Albumin: 4.3 g/dL (ref 3.6–4.8)
Alkaline Phosphatase: 72 IU/L (ref 39–117)
BUN/Creatinine Ratio: 21 (ref 12–28)
BUN: 14 mg/dL (ref 8–27)
Bilirubin Total: 0.4 mg/dL (ref 0.0–1.2)
CO2: 27 mmol/L (ref 20–29)
Calcium: 9 mg/dL (ref 8.7–10.3)
Chloride: 96 mmol/L (ref 96–106)
Creatinine, Ser: 0.68 mg/dL (ref 0.57–1.00)
GFR calc non Af Amer: 93 mL/min/{1.73_m2} (ref >59)
GFR, EST AFRICAN AMERICAN: 108 mL/min/{1.73_m2} (ref >59)
Globulin, Total: 2.2 g/dL (ref 1.5–4.5)
Glucose: 67 mg/dL (ref 65–99)
POTASSIUM: 4.2 mmol/L (ref 3.5–5.2)
Sodium: 138 mmol/L (ref 134–144)
Total Protein: 6.5 g/dL (ref 6.0–8.5)

## 2017-08-07 LAB — TSH: TSH: 1.9 u[IU]/mL (ref 0.450–4.500)

## 2017-08-07 LAB — VITAMIN D 25 HYDROXY (VIT D DEFICIENCY, FRACTURES): Vit D, 25-Hydroxy: 51.6 ng/mL (ref 30.0–100.0)

## 2017-08-07 LAB — CBC WITH DIFFERENTIAL/PLATELET
BASOS: 1 %
Basophils Absolute: 0 10*3/uL (ref 0.0–0.2)
EOS (ABSOLUTE): 0.1 10*3/uL (ref 0.0–0.4)
Eos: 1 %
Hematocrit: 34 % (ref 34.0–46.6)
Hemoglobin: 10.9 g/dL — ABNORMAL LOW (ref 11.1–15.9)
Immature Grans (Abs): 0 10*3/uL (ref 0.0–0.1)
Immature Granulocytes: 0 %
Lymphocytes Absolute: 1.5 10*3/uL (ref 0.7–3.1)
Lymphs: 23 %
MCH: 29.7 pg (ref 26.6–33.0)
MCHC: 32.1 g/dL (ref 31.5–35.7)
MCV: 93 fL (ref 79–97)
MONOS ABS: 0.5 10*3/uL (ref 0.1–0.9)
Monocytes: 8 %
NEUTROS ABS: 4.6 10*3/uL (ref 1.4–7.0)
Neutrophils: 67 %
PLATELETS: 326 10*3/uL (ref 150–450)
RBC: 3.67 x10E6/uL — ABNORMAL LOW (ref 3.77–5.28)
RDW: 14.5 % (ref 12.3–15.4)
WBC: 6.8 10*3/uL (ref 3.4–10.8)

## 2017-08-07 LAB — LIPID PANEL
Chol/HDL Ratio: 1.6 ratio (ref 0.0–4.4)
Cholesterol, Total: 159 mg/dL (ref 100–199)
HDL: 99 mg/dL (ref >39)
LDL Calculated: 52 mg/dL (ref 0–99)
TRIGLYCERIDES: 39 mg/dL (ref 0–149)
VLDL Cholesterol Cal: 8 mg/dL (ref 5–40)

## 2017-08-09 NOTE — Addendum Note (Signed)
Addended by: Valere Dross on: 08/09/2017 08:13 AM   Modules accepted: Orders

## 2017-08-12 ENCOUNTER — Other Ambulatory Visit: Payer: Self-pay | Admitting: *Deleted

## 2017-08-12 NOTE — Patient Outreach (Signed)
[place] [facility]) Care Management  08/12/2017  Julie Castillo Jul 19, 1955 [id number]   Received voicemail message from patient, states she has an additional question regarding how to obtain documentation for hospital indemnity claim and requested call back.   Telephone call to patient's work number, spoke with patient, and HIPAA verified.   States she has spoken with [employer], received answers to her questions, and has no further questions.  States she is appreciative of this RNCM's call back.  No CM needs and case will remain closed.     Jaiel Saraceno H. Annia Friendly, BSN, [place] Management [facility] Telephonic CM Phone: [phone] Fax: [phone]

## 2017-09-01 ENCOUNTER — Ambulatory Visit: Payer: 59 | Admitting: Gastroenterology

## 2017-09-01 ENCOUNTER — Encounter: Payer: Self-pay | Admitting: Gastroenterology

## 2017-09-01 ENCOUNTER — Encounter

## 2017-09-01 VITALS — BP 136/81 | HR 74 | Ht 63.0 in | Wt 161.0 lb

## 2017-09-01 DIAGNOSIS — D500 Iron deficiency anemia secondary to blood loss (chronic): Secondary | ICD-10-CM | POA: Diagnosis not present

## 2017-09-01 NOTE — Progress Notes (Signed)
Primary Care Physician: Glean Hess, MD  Primary Gastroenterologist:  Dr. Lucilla Lame  Chief Complaint  Patient presents with  . Anemia    HPI: Julie Castillo is a 62 y.o. female here for follow-up after being in the hospital.  The patient was admitted with a GI bleed and underwent an upper endoscopy and colonoscopy.  At the time of the colonoscopy the patient was found to have diverticulosis and a large amount of blood in her colon.  The source of bleeding was not seen but was presumed to be diverticular.  The patient has had no further bleeding.  She denies any abdominal pain nausea vomiting fevers or chills.  The patient did have a repeat blood count which showed her to have a slight drop in her blood count since discharge.  The patient's last colonoscopy was 10 years ago for screening purposes.  Current Outpatient Medications  Medication Sig Dispense Refill  . Cholecalciferol (VITAMIN D) 2000 units CAPS Take 2,000 Units by mouth daily.    . Cyanocobalamin (VITAMIN B 12 PO) Take 1 tablet by mouth daily.    Marland Kitchen docusate sodium (COLACE) 100 MG capsule Take 1 capsule (100 mg total) by mouth 2 (two) times daily. 10 capsule 0  . enalapril (VASOTEC) 20 MG tablet TAKE 1 TABLET BY MOUTH 2 TIMES DAILY. (Patient taking differently: 1 tab daily) 180 tablet 3  . ferrous sulfate 325 (65 FE) MG EC tablet Take 1 tablet (325 mg total) by mouth daily. 90 tablet 3  . fluticasone (FLONASE) 50 MCG/ACT nasal spray PLACE 2 SPRAYS DAILY INTO BOTH NOSTRILS. 16 g 2  . hydrochlorothiazide (HYDRODIURIL) 25 MG tablet TAKE 1 TABLET DAILY BY MOUTH. 90 tablet 3  . levothyroxine (SYNTHROID, LEVOTHROID) 25 MCG tablet TAKE 1 TABLET (25 MCG TOTAL) BY MOUTH DAILY BEFORE BREAKFAST. 90 tablet 3  . omeprazole (PRILOSEC) 20 MG capsule Take 1 capsule (20 mg total) by mouth 2 (two) times daily. 180 capsule 3  . Pyridoxine HCl (VITAMIN B6 PO) Take 1 tablet by mouth daily.    Marland Kitchen acetaminophen (TYLENOL) 325 MG tablet Take 2  tablets (650 mg total) by mouth every 6 (six) hours as needed for mild pain (or Fever >/= 101). (Patient not taking: Reported on 09/01/2017)     No current facility-administered medications for this visit.     Allergies as of 09/01/2017 - Review Complete 09/01/2017  Allergen Reaction Noted  . Sulfa antibiotics Hives 09/06/2013  . Tape Other (See Comments) and Rash 03/08/2014    ROS:  General: Negative for anorexia, weight loss, fever, chills, fatigue, weakness. ENT: Negative for hoarseness, difficulty swallowing , nasal congestion. CV: Negative for chest pain, angina, palpitations, dyspnea on exertion, peripheral edema.  Respiratory: Negative for dyspnea at rest, dyspnea on exertion, cough, sputum, wheezing.  GI: See history of present illness. GU:  Negative for dysuria, hematuria, urinary incontinence, urinary frequency, nocturnal urination.  Endo: Negative for unusual weight change.    Physical Examination:   BP 136/81   Pulse 74   Ht 5\' 3"  (1.6 m)   Wt 161 lb (73 kg)   BMI 28.52 kg/m   General: Well-nourished, well-developed in no acute distress.  Eyes: No icterus. Conjunctivae pink. Extremities: No lower extremity edema. No clubbing or deformities. Neuro: Alert and oriented x 3.  Grossly intact. Skin: Warm and dry, no jaundice.   Psych: Alert and cooperative, normal mood and affect.  Labs:    Imaging Studies: No results found.  Assessment  and Plan:   Julie Castillo is a 62 y.o. y/o female who comes in today with a history of a diverticular bleed with significant anemia at that time.  The patient was followed up by her primary care provider and her blood count had gone down slightly since discharge.  The patient has had no further bleeding.  The patient also had a significant amount of blood obscuring her colon thereby not having a full look at her colon.  The patient will have her blood checked for a CBC to make sure her blood count is coming back up.  The patient will  also be set up for a screening colonoscopy.    Lucilla Lame, MD. Marval Regal   Note: This dictation was prepared with Dragon dictation along with smaller phrase technology. Any transcriptional errors that result from this process are unintentional.

## 2017-09-02 LAB — CBC
HEMATOCRIT: 38.4 % (ref 34.0–46.6)
HEMOGLOBIN: 12.8 g/dL (ref 11.1–15.9)
MCH: 30 pg (ref 26.6–33.0)
MCHC: 33.3 g/dL (ref 31.5–35.7)
MCV: 90 fL (ref 79–97)
Platelets: 211 10*3/uL (ref 150–450)
RBC: 4.27 x10E6/uL (ref 3.77–5.28)
RDW: 14.6 % (ref 12.3–15.4)
WBC: 4.5 10*3/uL (ref 3.4–10.8)

## 2017-09-03 ENCOUNTER — Telehealth: Payer: Self-pay

## 2017-09-03 NOTE — Telephone Encounter (Signed)
-----   Message from Lucilla Lame, MD sent at 09/02/2017 10:01 PM EDT ----- Let the patient know her blood count went back to normal.

## 2017-09-03 NOTE — Telephone Encounter (Signed)
Pt notified of results

## 2017-09-06 ENCOUNTER — Telehealth: Payer: Self-pay

## 2017-09-06 NOTE — Telephone Encounter (Signed)
Patient called from work downstairs at [employer] in [place] ([place] Hmgbl is now normal and she needs to know if she needs to stay on Iron or not.

## 2017-09-06 NOTE — Telephone Encounter (Signed)
I believe that she can stop the iron supplement.

## 2017-09-06 NOTE — Telephone Encounter (Signed)
Patient informed to stop iron suppl.

## 2017-09-09 ENCOUNTER — Ambulatory Visit: Payer: 59 | Admitting: Gastroenterology

## 2017-09-16 DIAGNOSIS — H40003 Preglaucoma, unspecified, bilateral: Secondary | ICD-10-CM | POA: Diagnosis not present

## 2017-09-16 DIAGNOSIS — H2513 Age-related nuclear cataract, bilateral: Secondary | ICD-10-CM | POA: Diagnosis not present

## 2017-10-14 DIAGNOSIS — M170 Bilateral primary osteoarthritis of knee: Secondary | ICD-10-CM | POA: Diagnosis not present

## 2017-10-14 DIAGNOSIS — M1712 Unilateral primary osteoarthritis, left knee: Secondary | ICD-10-CM | POA: Diagnosis not present

## 2017-10-20 ENCOUNTER — Other Ambulatory Visit: Payer: Self-pay

## 2017-10-20 DIAGNOSIS — Z1211 Encounter for screening for malignant neoplasm of colon: Secondary | ICD-10-CM

## 2017-11-10 NOTE — Discharge Instructions (Signed)
General Anesthesia, Adult, Care After °These instructions provide you with information about caring for yourself after your procedure. Your health care provider may also give you more specific instructions. Your treatment has been planned according to current medical practices, but problems sometimes occur. Call your health care provider if you have any problems or questions after your procedure. °What can I expect after the procedure? °After the procedure, it is common to have: °· Vomiting. °· A sore throat. °· Mental slowness. ° °It is common to feel: °· Nauseous. °· Cold or shivery. °· Sleepy. °· Tired. °· Sore or achy, even in parts of your body where you did not have surgery. ° °Follow these instructions at home: °For at least 24 hours after the procedure: °· Do not: °? Participate in activities where you could fall or become injured. °? Drive. °? Use heavy machinery. °? Drink alcohol. °? Take sleeping pills or medicines that cause drowsiness. °? Make important decisions or sign legal documents. °? Take care of children on your own. °· Rest. °Eating and drinking °· If you vomit, drink water, juice, or soup when you can drink without vomiting. °· Drink enough fluid to keep your urine clear or pale yellow. °· Make sure you have little or no nausea before eating solid foods. °· Follow the diet recommended by your health care provider. °General instructions °· Have a responsible adult stay with you until you are awake and alert. °· Return to your normal activities as told by your health care provider. Ask your health care provider what activities are safe for you. °· Take over-the-counter and prescription medicines only as told by your health care provider. °· If you smoke, do not smoke without supervision. °· Keep all follow-up visits as told by your health care provider. This is important. °Contact a health care provider if: °· You continue to have nausea or vomiting at home, and medicines are not helpful. °· You  cannot drink fluids or start eating again. °· You cannot urinate after 8-12 hours. °· You develop a skin rash. °· You have fever. °· You have increasing redness at the site of your procedure. °Get help right away if: °· You have difficulty breathing. °· You have chest pain. °· You have unexpected bleeding. °· You feel that you are having a life-threatening or urgent problem. °This information is not intended to replace advice given to you by your health care provider. Make sure you discuss any questions you have with your health care provider. °Document Released: 05/18/2000 Document Revised: 07/15/2015 Document Reviewed: 01/24/2015 °Elsevier Interactive Patient Education © 2018 [employer] ° °

## 2017-11-11 ENCOUNTER — Ambulatory Visit: Payer: 59 | Admitting: Anesthesiology

## 2017-11-11 ENCOUNTER — Ambulatory Visit
Admission: RE | Admit: 2017-11-11 | Discharge: 2017-11-11 | Disposition: A | Discharge status: Home / Self Care | Payer: 59 | Source: Ambulatory Visit | Attending: Gastroenterology | Admitting: Gastroenterology

## 2017-11-11 ENCOUNTER — Encounter
Admission: RE | Disposition: A | Discharge status: Home / Self Care | Payer: Self-pay | Source: Ambulatory Visit | Attending: Gastroenterology

## 2017-11-11 DIAGNOSIS — Z91048 Other nonmedicinal substance allergy status: Secondary | ICD-10-CM | POA: Diagnosis not present

## 2017-11-11 DIAGNOSIS — M1990 Unspecified osteoarthritis, unspecified site: Secondary | ICD-10-CM | POA: Insufficient documentation

## 2017-11-11 DIAGNOSIS — Z79899 Other long term (current) drug therapy: Secondary | ICD-10-CM | POA: Insufficient documentation

## 2017-11-11 DIAGNOSIS — K5730 Diverticulosis of large intestine without perforation or abscess without bleeding: Secondary | ICD-10-CM | POA: Diagnosis not present

## 2017-11-11 DIAGNOSIS — K219 Gastro-esophageal reflux disease without esophagitis: Secondary | ICD-10-CM | POA: Diagnosis not present

## 2017-11-11 DIAGNOSIS — I10 Essential (primary) hypertension: Secondary | ICD-10-CM | POA: Insufficient documentation

## 2017-11-11 DIAGNOSIS — Z1211 Encounter for screening for malignant neoplasm of colon: Secondary | ICD-10-CM | POA: Diagnosis not present

## 2017-11-11 DIAGNOSIS — E039 Hypothyroidism, unspecified: Secondary | ICD-10-CM | POA: Diagnosis not present

## 2017-11-11 DIAGNOSIS — Z882 Allergy status to sulfonamides status: Secondary | ICD-10-CM | POA: Diagnosis not present

## 2017-11-11 DIAGNOSIS — D122 Benign neoplasm of ascending colon: Secondary | ICD-10-CM | POA: Diagnosis not present

## 2017-11-11 DIAGNOSIS — Z803 Family history of malignant neoplasm of breast: Secondary | ICD-10-CM | POA: Insufficient documentation

## 2017-11-11 DIAGNOSIS — Z79890 Hormone replacement therapy (postmenopausal): Secondary | ICD-10-CM | POA: Diagnosis not present

## 2017-11-11 DIAGNOSIS — Z7951 Long term (current) use of inhaled steroids: Secondary | ICD-10-CM | POA: Insufficient documentation

## 2017-11-11 DIAGNOSIS — D126 Benign neoplasm of colon, unspecified: Secondary | ICD-10-CM | POA: Diagnosis not present

## 2017-11-11 HISTORY — PX: POLYPECTOMY: SHX5525

## 2017-11-11 HISTORY — PX: COLONOSCOPY WITH PROPOFOL: SHX5780

## 2017-11-11 SURGERY — COLONOSCOPY WITH PROPOFOL
Anesthesia: General | Site: Rectum

## 2017-11-11 MED ORDER — SODIUM CHLORIDE 0.9 % IV SOLN: INTRAVENOUS | Status: DC

## 2017-11-11 MED ORDER — PROPOFOL 10 MG/ML IV BOLUS
INTRAVENOUS | Status: DC | PRN
  Administered 2017-11-11 (×2): 20 mg via INTRAVENOUS
  Administered 2017-11-11: 40 mg via INTRAVENOUS
  Administered 2017-11-11: 20 mg via INTRAVENOUS
  Administered 2017-11-11: 40 mg via INTRAVENOUS
  Administered 2017-11-11: 80 mg via INTRAVENOUS
  Administered 2017-11-11: 20 mg via INTRAVENOUS

## 2017-11-11 MED ORDER — LIDOCAINE HCL (CARDIAC) PF 100 MG/5ML IV SOSY
PREFILLED_SYRINGE | INTRAVENOUS | Status: DC | PRN
  Administered 2017-11-11: 80 mg via INTRAVENOUS

## 2017-11-11 MED ORDER — STERILE WATER FOR IRRIGATION IR SOLN
Status: DC | PRN
  Administered 2017-11-11: 09:00:00

## 2017-11-11 MED ORDER — LACTATED RINGERS IV SOLN
INTRAVENOUS | Status: DC | PRN
  Administered 2017-11-11: 09:00:00 via INTRAVENOUS

## 2017-11-11 SURGICAL SUPPLY — 6 items
GOWN CVR UNV OPN BCK APRN NK (MISCELLANEOUS) ×2 IMPLANT
GOWN ISOL THUMB LOOP REG UNIV (MISCELLANEOUS) ×2
KIT ENDO PROCEDURE OLY (KITS) ×2 IMPLANT
SNARE SHORT THROW 13M SML OVAL (MISCELLANEOUS) ×2 IMPLANT
TRAP ETRAP POLY (MISCELLANEOUS) ×2 IMPLANT
WATER STERILE IRR 250ML POUR (IV SOLUTION) ×2 IMPLANT

## 2017-11-11 NOTE — Transfer of Care (Signed)
Immediate Anesthesia Transfer of Care Note  Patient: Julie Castillo  Procedure(s) Performed: COLONOSCOPY WITH PROPOFOL WITH BIOPSIES (N/A Rectum) POLYPECTOMY (N/A Rectum)  Patient Location: PACU  Anesthesia Type: General  Level of Consciousness: awake, alert  and patient cooperative  Airway and Oxygen Therapy: Patient Spontanous Breathing and Patient connected to supplemental oxygen  Post-op Assessment: Post-op Vital signs reviewed, Patient's Cardiovascular Status Stable, Respiratory Function Stable, Patent Airway and No signs of Nausea or vomiting  Post-op Vital Signs: Reviewed and stable  Complications: No apparent anesthesia complications

## 2017-11-11 NOTE — Anesthesia Preprocedure Evaluation (Signed)
Anesthesia Evaluation  Patient identified by MRN, date of birth, ID band Patient awake    Reviewed: Allergy & Precautions, NPO status , Patient's Chart, lab work & pertinent test results, reviewed documented beta blocker date and time   History of Anesthesia Complications (+) PONV and history of anesthetic complications  Airway Mallampati: II  TM Distance: >3 FB Neck ROM: Full    Dental no notable dental hx.    Pulmonary pneumonia,    Pulmonary exam normal breath sounds clear to auscultation       Cardiovascular hypertension, Normal cardiovascular exam Rhythm:Regular Rate:Normal     Neuro/Psych negative neurological ROS     GI/Hepatic Neg liver ROS, GERD  ,  Endo/Other  Hypothyroidism   Renal/GU negative Renal ROS  negative genitourinary   Musculoskeletal  (+) Arthritis ,   Abdominal Normal abdominal exam  (+)   Peds  Hematology  (+) anemia , Blood loss anemia from diverticulosis   Anesthesia Other Findings   Reproductive/Obstetrics                             Anesthesia Physical Anesthesia Plan  ASA: II  Anesthesia Plan: General   Post-op Pain Management:    Induction: Intravenous  PONV Risk Score and Plan:   Airway Management Planned: Natural Airway  Additional Equipment: None  Intra-op Plan:   Post-operative Plan:   Informed Consent: I have reviewed the patients History and Physical, chart, labs and discussed the procedure including the risks, benefits and alternatives for the proposed anesthesia with the patient or authorized representative who has indicated his/her understanding and acceptance.     Plan Discussed with: CRNA, Anesthesiologist and Surgeon  Anesthesia Plan Comments:         Anesthesia Quick Evaluation

## 2017-11-11 NOTE — Anesthesia Postprocedure Evaluation (Signed)
Anesthesia Post Note  Patient: Julie Castillo  Procedure(s) Performed: COLONOSCOPY WITH PROPOFOL WITH BIOPSIES (N/A Rectum) POLYPECTOMY (N/A Rectum)  Patient location during evaluation: PACU Anesthesia Type: General Level of consciousness: awake Pain management: pain level controlled Vital Signs Assessment: post-procedure vital signs reviewed and stable Respiratory status: spontaneous breathing Cardiovascular status: blood pressure returned to baseline Postop Assessment: no headache Anesthetic complications: no    Lavonna Monarch

## 2017-11-11 NOTE — Op Note (Signed)
[facility] Gastroenterology Patient Name: Julie Castillo Procedure Date: 11/11/2017 9:04 AM MRN: [id number] Account #: [id number] Date of Birth: 04/24/1955 Admit Type: Outpatient Age: 62 Room: [facility] OR ROOM 01 Gender: Female Note Status: Finalized Procedure:            Colonoscopy Indications:          Screening for colorectal malignant neoplasm Providers:            Lucilla Lame MD, MD Referring MD:         Halina Maidens, MD (Referring MD) Medicines:            Propofol per Anesthesia Complications:        No immediate complications. Procedure:            Pre-Anesthesia Assessment:                       - Prior to the procedure, a History and Physical was                        performed, and patient medications and allergies were                        reviewed. The patient's tolerance of previous                        anesthesia was also reviewed. The risks and benefits of                        the procedure and the sedation options and risks were                        discussed with the patient. All questions were                        answered, and informed consent was obtained. Prior                        Anticoagulants: The patient has taken no previous                        anticoagulant or antiplatelet agents. ASA Grade                        Assessment: II - A patient with mild systemic disease.                        After reviewing the risks and benefits, the patient was                        deemed in satisfactory condition to undergo the                        procedure.                       After obtaining informed consent, the colonoscope was                        passed under direct vision. Throughout the procedure,  the patient's blood pressure, pulse, and oxygen                        saturations were monitored continuously. The was                        introduced through the anus and advanced to the the              cecum, identified by appendiceal orifice and ileocecal                        valve. The colonoscopy was performed without                        difficulty. The patient tolerated the procedure well.                        The quality of the bowel preparation was good. Findings:      The perianal and digital rectal examinations were normal.      A 5 mm polyp was found in the ascending colon. The polyp was sessile.       The polyp was removed with a cold snare. Resection and retrieval were       complete.      Multiple small-mouthed diverticula were found in the sigmoid colon. Impression:           - One 5 mm polyp in the ascending colon, removed with a                        cold snare. Resected and retrieved.                       - Diverticulosis in the sigmoid colon. Recommendation:       - Discharge patient to home.                       - Resume previous diet.                       - Continue present medications.                       - Await pathology results.                       - Repeat colonoscopy in 5 years if polyp adenoma and 10                        years if hyperplastic Procedure Code(s):    --- Professional ---                       [phone], Colonoscopy, flexible; with removal of tumor(s),                        polyp(s), or other lesion(s) by snare technique Diagnosis Code(s):    --- Professional ---                       Z12.11, Encounter for screening for malignant neoplasm  of colon                       D12.2, Benign neoplasm of ascending colon CPT copyright 2017 American Medical Association. All rights reserved. The codes documented in this report are preliminary and upon coder review may  be revised to meet current compliance requirements. Lucilla Lame MD, MD 11/11/2017 9:43:07 AM This report has been signed electronically. Number of Addenda: 0 Note Initiated On: 11/11/2017 9:04 AM Scope Withdrawal Time: 0 hours 6 minutes 56  seconds  Total Procedure Duration: 0 hours 22 minutes 12 seconds       [facility]

## 2017-11-11 NOTE — H&P (Signed)
Julie Lame, MD [facility] [address], [place], [place] [address] Phone: [phone] Fax : [phone]  Primary Care Physician:  Glean Hess, MD Primary Gastroenterologist:  Dr. Allen Norris  Pre-Procedure History & Physical: HPI:  Julie Castillo is a 62 y.o. female is here for a screening colonoscopy.   Past Medical History:  Diagnosis Date  . Arthritis   . Edema    lower extremities after standing a long time   . Family history of adverse reaction to anesthesia    father- post op became violent   . Foot drop, right 2016   developed foot drop following RTKA in 2016; had to wear foot drop shoe following discharge ; per pat "i had pain in my ankle real bad but it ended up being dx as foot drop"  . GERD (gastroesophageal reflux disease)   . Hypertension   . Hypothyroidism   . Pneumonia    hx of   . PONV (postoperative nausea and vomiting)    post op day 1   . Varicose veins     Past Surgical History:  Procedure Laterality Date  . BREAST CYST ASPIRATION Left 1988   "it just a fineneedle aspiration"   . COLONOSCOPY N/A 07/10/2017   Procedure: COLONOSCOPY;  Surgeon: Toledo, Benay Pike, MD;  Location: [facility] ENDOSCOPY;  Service: Gastroenterology;  Laterality: N/A;  . ESOPHAGOGASTRODUODENOSCOPY N/A 07/10/2017   Procedure: ESOPHAGOGASTRODUODENOSCOPY (EGD);  Surgeon: Toledo, Benay Pike, MD;  Location: [facility] ENDOSCOPY;  Service: Gastroenterology;  Laterality: N/A;  . EYE SURGERY  2001,2005   left eye surgery x 2 ( eyelid)   . 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 TOTAL KNEE ARTHROPLASTY Left 10/19/2016   Procedure: LEFT TOTAL KNEE ARTHROPLASTY;  Surgeon: Paralee Cancel, MD;  Location: [facility] ORS;  Service: Orthopedics;  Laterality: Left;  Marland Kitchen VARICOSE VEIN SURGERY  2007    Prior to Admission medications   Medication Sig Start Date End Date Taking?  Authorizing Provider  acetaminophen (TYLENOL) 325 MG tablet Take 2 tablets (650 mg total) by mouth every 6 (six) hours as needed for mild pain (or Fever >/= 101). 07/12/17  Yes Gouru, Illene Silver, MD  Cholecalciferol (VITAMIN D) 2000 units CAPS Take 2,000 Units by mouth daily.   Yes [provider]  Cyanocobalamin (VITAMIN B 12 PO) Take 1 tablet by mouth daily.   Yes [provider]  docusate sodium (COLACE) 100 MG capsule Take 1 capsule (100 mg total) by mouth 2 (two) times daily. 07/12/17  Yes Gouru, Aruna, MD  enalapril (VASOTEC) 20 MG tablet TAKE 1 TABLET BY MOUTH 2 TIMES DAILY. Patient taking differently: 1 tab daily 04/12/17  Yes Glean Hess, MD  fluticasone (FLONASE) 50 MCG/ACT nasal spray PLACE 2 SPRAYS DAILY INTO BOTH NOSTRILS. 07/05/17  Yes Glean Hess, MD  hydrochlorothiazide (HYDRODIURIL) 25 MG tablet TAKE 1 TABLET DAILY BY MOUTH. 04/12/17  Yes Glean Hess, MD  levothyroxine (SYNTHROID, LEVOTHROID) 25 MCG tablet TAKE 1 TABLET (25 MCG TOTAL) BY MOUTH DAILY BEFORE BREAKFAST. 07/05/17  Yes Glean Hess, MD  omeprazole (PRILOSEC) 20 MG capsule Take 1 capsule (20 mg total) by mouth 2 (two) times daily. 01/29/17  Yes Glean Hess, MD  Pyridoxine HCl (VITAMIN B6 PO) Take 1 tablet by mouth daily.   Yes [provider]  ferrous sulfate 325 (65 FE) MG EC tablet  Take 1 tablet (325 mg total) by mouth daily. Patient not taking: Reported on 11/02/2017 07/12/17 10/10/17  Nicholes Mango, MD    Allergies as of 10/20/2017 - Review Complete 09/01/2017  Allergen Reaction Noted  . Sulfa antibiotics Hives 09/06/2013  . Tape Other (See Comments) and Rash 03/08/2014    Family History  Problem Relation Age of Onset  . Breast cancer Mother 30  . Breast cancer Paternal Aunt 55  . Breast cancer Cousin 35       BRCA Negative    Social History   Socioeconomic History  . Marital status: Married    Spouse name: Not on file  . Number of children: Not on file  .  Years of education: Not on file  . Highest education level: Not on file  Occupational History  . Not on file  Social Needs  . Financial resource strain: Not on file  . Food insecurity:    Worry: Not on file    Inability: Not on file  . Transportation needs:    Medical: Not on file    Non-medical: Not on file  Tobacco Use  . Smoking status: Never Smoker  . Smokeless tobacco: Never Used  Substance and Sexual Activity  . Alcohol use: Yes    Comment: rarely  . Drug use: No  . Sexual activity: Not on file  Lifestyle  . Physical activity:    Days per week: Not on file    Minutes per session: Not on file  . Stress: Not on file  Relationships  . Social connections:    Talks on phone: Not on file    Gets together: Not on file    Attends religious service: Not on file    Active member of club or organization: Not on file    Attends meetings of clubs or organizations: Not on file    Relationship status: Not on file  . Intimate partner violence:    Fear of current or ex partner: Not on file    Emotionally abused: Not on file    Physically abused: Not on file    Forced sexual activity: Not on file  Other Topics Concern  . Not on file  Social History Narrative  . Not on file    Review of Systems: See HPI, otherwise negative ROS  Physical Exam: BP 139/86   Pulse 83   Temp 97.7 F (36.5 C) (Temporal)   Resp 16   Ht 5' 3" (1.6 m)   Wt 68.9 kg   SpO2 100%   BMI 26.93 kg/m  General:   Alert,  pleasant and cooperative in NAD Head:  Normocephalic and atraumatic. Neck:  Supple; no masses or thyromegaly. Lungs:  Clear throughout to auscultation.    Heart:  Regular rate and rhythm. Abdomen:  Soft, nontender and nondistended. Normal bowel sounds, without guarding, and without rebound.   Neurologic:  Alert and  oriented x4;  grossly normal neurologically.  Impression/Plan: Julie Castillo is now here to undergo a screening colonoscopy.  Risks, benefits, and alternatives  regarding colonoscopy have been reviewed with the patient.  Questions have been answered.  All parties agreeable.

## 2017-11-11 NOTE — Anesthesia Procedure Notes (Signed)
Procedure Name: MAC Date/Time: 11/11/2017 9:19 AM Performed by: Lind Guest, CRNA Pre-anesthesia Checklist: Patient identified, Emergency Drugs available, Suction available, Patient being monitored and Timeout performed Patient Re-evaluated:Patient Re-evaluated prior to induction Oxygen Delivery Method: Nasal cannula

## 2017-11-12 ENCOUNTER — Encounter: Payer: Self-pay | Admitting: Gastroenterology

## 2017-11-15 ENCOUNTER — Encounter: Payer: Self-pay | Admitting: Gastroenterology

## 2017-11-15 LAB — SURGICAL PATHOLOGY

## 2017-12-13 ENCOUNTER — Other Ambulatory Visit: Payer: Self-pay | Admitting: Internal Medicine

## 2017-12-21 ENCOUNTER — Encounter: Payer: Self-pay | Admitting: Internal Medicine

## 2017-12-21 NOTE — Telephone Encounter (Signed)
Please advise my chart message.  Patient changed how taking bp meds because of elevated Bp.  Thank you.

## 2018-01-17 ENCOUNTER — Ambulatory Visit: Payer: 59 | Admitting: Internal Medicine

## 2018-01-18 ENCOUNTER — Ambulatory Visit: Payer: 59 | Admitting: Internal Medicine

## 2018-01-18 ENCOUNTER — Encounter: Payer: Self-pay | Admitting: Internal Medicine

## 2018-01-18 VITALS — BP 162/97 | HR 76 | Resp 16 | Ht 63.0 in | Wt 164.0 lb

## 2018-01-18 DIAGNOSIS — I10 Essential (primary) hypertension: Secondary | ICD-10-CM

## 2018-01-18 DIAGNOSIS — M7582 Other shoulder lesions, left shoulder: Secondary | ICD-10-CM | POA: Diagnosis not present

## 2018-01-18 DIAGNOSIS — M778 Other enthesopathies, not elsewhere classified: Secondary | ICD-10-CM | POA: Insufficient documentation

## 2018-01-18 MED ORDER — AMLODIPINE BESYLATE 5 MG PO TABS: 5.0000 mg | ORAL_TABLET | Freq: Every day | ORAL | 3 refills | Status: DC

## 2018-01-18 NOTE — Patient Instructions (Signed)
Tylenol 2 tablets twice a day  Ice shoulder for 10 minutes twice a day

## 2018-01-18 NOTE — Progress Notes (Signed)
Date:  01/18/2018   Name:  Julie Castillo   DOB:  06-29-1955   MRN:  [id number]   Chief Complaint: Hypertension and Arm Pain (Left arm pain 3 weeks no injury )  Hypertension  This is a chronic (seems to be higher despite same medication - resumed bid enalapril a month ago) problem. The problem has been gradually worsening since onset. Pertinent negatives include no chest pain, headaches, palpitations, peripheral edema or shortness of breath. Past treatments include ACE inhibitors and diuretics. The current treatment provides significant improvement.  Shoulder Pain   The pain is present in the left shoulder. This is a new problem. The current episode started 1 to 4 weeks ago. The problem occurs daily. The problem has been unchanged. The quality of the pain is described as aching. The pain is moderate. Associated symptoms include an inability to bear weight and a limited range of motion. Pertinent negatives include no fever, joint locking, joint swelling, numbness or tingling. The symptoms are aggravated by activity. She has tried nothing for the symptoms.    Review of Systems  Constitutional: Negative for chills, fatigue and fever.  Respiratory: Negative for cough, chest tightness and shortness of breath.   Cardiovascular: Negative for chest pain and palpitations.  Musculoskeletal: Positive for arthralgias and myalgias. Negative for joint swelling.  Neurological: Negative for tingling, weakness, numbness and headaches.    Patient Active Problem List   Diagnosis Date Noted  . Colon cancer screening   . Benign neoplasm of ascending colon   . Snoring 07/14/2017  . GIB (gastrointestinal bleeding) 07/08/2017  . Vitreous floaters of right eye 01/29/2017  . 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 Mixed hyperlipidemia 10/02/2016  . Lower extremity edema 03/27/2016  . Vitamin D deficiency 03/27/2016  . Seasonal allergies 03/27/2016  . Ptosis of eyelid, left 03/27/2016  . Essential hypertension  03/22/2015  . Gastroesophageal reflux disease without esophagitis 03/22/2015  . Hypothyroidism due to acquired atrophy of thyroid 03/22/2015  . 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 Sulfa Antibiotics Hives  . Tape Other (See Comments) and Rash    Sensitive skin- can use paper tape bandaids- make skin red  Sensitive skin- can use paper tape    Past Surgical History:  Procedure Laterality Date  . BREAST CYST ASPIRATION Left 1988   "it just a fineneedle aspiration"   . COLONOSCOPY N/A 07/10/2017   Procedure: COLONOSCOPY;  Surgeon: Toledo, Benay Pike, MD;  Location: [facility];  Service: Gastroenterology;  Laterality: N/A;  . COLONOSCOPY WITH PROPOFOL N/A 11/11/2017   Procedure: COLONOSCOPY WITH PROPOFOL WITH BIOPSIES;  Surgeon: Lucilla Lame, MD;  Location: [place];  Service: Endoscopy;  Laterality: N/A;  . ESOPHAGOGASTRODUODENOSCOPY N/A 07/10/2017   Procedure: ESOPHAGOGASTRODUODENOSCOPY (EGD);  Surgeon: Toledo, Benay Pike, MD;  Location: [facility];  Service: Gastroenterology;  Laterality: N/A;  . EYE SURGERY  2001,2005   left eye surgery x 2 ( eyelid)   . POLYPECTOMY N/A 11/11/2017   Procedure: POLYPECTOMY;  Surgeon: Lucilla Lame, MD;  Location: [place];  Service: Endoscopy;  Laterality: N/A;  . 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 TOTAL KNEE ARTHROPLASTY Left 10/19/2016   Procedure: LEFT TOTAL KNEE ARTHROPLASTY;  Surgeon: Paralee Cancel, MD;  Location: [facility];  Service: Orthopedics;  Laterality: Left;  Marland Kitchen [place] SURGERY  2007  Social History   Tobacco Use  . Smoking status: Never Smoker  . Smokeless tobacco: Never Used  Substance Use Topics  . Alcohol use: Yes    Comment: rarely  . Drug use: No     Medication list has been reviewed and updated.  Current Meds  Medication Sig   . acetaminophen (TYLENOL) 325 MG tablet Take 2 tablets (650 mg total) by mouth every 6 (six) hours as needed for mild pain (or Fever >/= 101).  . Cholecalciferol (VITAMIN D) 2000 units CAPS Take 2,000 Units by mouth daily.  . Cyanocobalamin (VITAMIN B 12 PO) Take 1 tablet by mouth daily.  Marland Kitchen docusate sodium (COLACE) 100 MG capsule Take 1 capsule (100 mg total) by mouth 2 (two) times daily.  . enalapril (VASOTEC) 20 MG tablet TAKE 1 TABLET BY MOUTH 2 TIMES DAILY. (Patient taking differently: 1 tab daily)  . fluticasone (FLONASE) 50 MCG/ACT nasal spray PLACE 2 SPRAYS DAILY INTO BOTH NOSTRILS.  . hydrochlorothiazide (HYDRODIURIL) 25 MG tablet TAKE 1 TABLET DAILY BY MOUTH.  Marland Kitchen levothyroxine (SYNTHROID, LEVOTHROID) 25 MCG tablet TAKE 1 TABLET (25 MCG TOTAL) BY MOUTH DAILY BEFORE BREAKFAST.  Marland Kitchen omeprazole (PRILOSEC) 20 MG capsule Take 1 capsule (20 mg total) by mouth 2 (two) times daily.  . Pyridoxine HCl (VITAMIN B6 PO) Take 1 tablet by mouth daily.    PHQ 2/9 Scores 08/06/2017 08/06/2017 10/02/2016  PHQ - 2 Score 0 0 0  PHQ- 9 Score - 0 -    Physical Exam  Constitutional: She is oriented to person, place, and time. She appears well-developed. No distress.  HENT:  Head: Normocephalic and atraumatic.  Neck: Normal range of motion. Neck supple.  Cardiovascular: Normal rate, regular rhythm and normal heart sounds.  Pulmonary/Chest: Effort normal and breath sounds normal. No respiratory distress.  Musculoskeletal:       Left shoulder: She exhibits decreased range of motion and tenderness. She exhibits normal pulse and normal strength.       Cervical back: Normal.  Neurological: She is alert and oriented to person, place, and time.  Skin: Skin is warm and dry. No rash noted.  Psychiatric: She has a normal mood and affect. Her behavior is normal. Thought content normal.  Nursing note and vitals reviewed.   BP (!) 162/97   Pulse 76   Resp 16   Ht 5\' 3"  (1.6 m)   Wt 164 lb (74.4 kg)   BMI 29.05  kg/m   Assessment and Plan: 1. Essential hypertension Continue enalapril and HCTZ Add amlodipine Follow up in 6 wks - amLODipine (NORVASC) 5 MG tablet; Take 1 tablet (5 mg total) by mouth daily.  Dispense: 90 tablet; Refill: 3  2. Shoulder tendonitis, left Tylenol bid plus ice twice a day May need to see Ortho if no improvement in ~ 2 weeks   Partially dictated using Editor, commissioning. Any errors are unintentional.  Halina Maidens, MD [facility]  01/18/2018

## 2018-01-24 ENCOUNTER — Other Ambulatory Visit: Payer: Self-pay | Admitting: Internal Medicine

## 2018-01-24 DIAGNOSIS — Z1231 Encounter for screening mammogram for malignant neoplasm of breast: Secondary | ICD-10-CM

## 2018-01-31 ENCOUNTER — Ambulatory Visit
Admission: RE | Admit: 2018-01-31 | Discharge: 2018-01-31 | Disposition: A | Discharge status: Home / Self Care | Payer: 59 | Source: Ambulatory Visit | Attending: Internal Medicine | Admitting: Internal Medicine

## 2018-01-31 DIAGNOSIS — Z1231 Encounter for screening mammogram for malignant neoplasm of breast: Secondary | ICD-10-CM | POA: Insufficient documentation

## 2018-03-02 ENCOUNTER — Encounter: Payer: Self-pay | Admitting: Internal Medicine

## 2018-03-02 ENCOUNTER — Ambulatory Visit: Payer: 59 | Admitting: Internal Medicine

## 2018-03-02 VITALS — BP 122/76 | HR 70 | Ht 63.0 in | Wt 168.6 lb

## 2018-03-02 DIAGNOSIS — I10 Essential (primary) hypertension: Secondary | ICD-10-CM | POA: Diagnosis not present

## 2018-03-02 MED ORDER — ENALAPRIL MALEATE 20 MG PO TABS: 20.0000 mg | ORAL_TABLET | Freq: Two times a day (BID) | ORAL | 3 refills | Status: DC

## 2018-03-02 NOTE — Progress Notes (Signed)
Date:  03/02/2018   Name:  Julie Castillo   DOB:  1955-03-22   MRN:  [id number]   Chief Complaint: Hypertension (6 week follow up. )  Hypertension  This is a chronic problem. The problem has been gradually improving since onset. Pertinent negatives include no chest pain, headaches, palpitations or shortness of breath. Past treatments include calcium channel blockers and ACE inhibitors (amlodipine added last visit).  She has done well with no side effects.  BP has been normal when checked at home.  Review of Systems  Constitutional: Negative for chills, fatigue and fever.  Respiratory: Negative for cough, chest tightness, shortness of breath and wheezing.   Cardiovascular: Negative for chest pain, palpitations and leg swelling.  Gastrointestinal: Negative for constipation.  Musculoskeletal: Positive for arthralgias.  Neurological: Negative for dizziness, light-headedness and headaches.  Hematological: Negative for adenopathy.    Patient Active Problem List   Diagnosis Date Noted  . Shoulder tendonitis, left 01/18/2018  . Colon cancer screening   . Benign neoplasm of ascending colon   . Snoring 07/14/2017  . GIB (gastrointestinal bleeding) 07/08/2017  . Vitreous floaters of right eye 01/29/2017  . 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 Mixed hyperlipidemia 10/02/2016  . Lower extremity edema 03/27/2016  . Vitamin D deficiency 03/27/2016  . Seasonal allergies 03/27/2016  . Ptosis of eyelid, left 03/27/2016  . Essential hypertension 03/22/2015  . Gastroesophageal reflux disease without esophagitis 03/22/2015  . Hypothyroidism due to acquired atrophy of thyroid 03/22/2015  . 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 Sulfa Antibiotics Hives  . Tape Other (See Comments) and Rash    Sensitive skin- can use paper tape bandaids- make skin red  Sensitive skin- can use paper tape    Past Surgical History:  Procedure Laterality Date  . BREAST CYST ASPIRATION Left 1988   "it just a fineneedle aspiration"   . COLONOSCOPY N/A 07/10/2017   Procedure: COLONOSCOPY;  Surgeon: Toledo, Benay Pike, MD;  Location: [facility];  Service: Gastroenterology;  Laterality: N/A;  . COLONOSCOPY WITH PROPOFOL N/A 11/11/2017   Procedure: COLONOSCOPY WITH PROPOFOL WITH BIOPSIES;  Surgeon: Lucilla Lame, MD;  Location: [place];  Service: Endoscopy;  Laterality: N/A;  . ESOPHAGOGASTRODUODENOSCOPY N/A 07/10/2017   Procedure: ESOPHAGOGASTRODUODENOSCOPY (EGD);  Surgeon: Toledo, Benay Pike, MD;  Location: [facility];  Service: Gastroenterology;  Laterality: N/A;  . EYE SURGERY  2001,2005   left eye surgery x 2 ( eyelid)   . POLYPECTOMY N/A 11/11/2017   Procedure: POLYPECTOMY;  Surgeon: Lucilla Lame, MD;  Location: [place];  Service: Endoscopy;  Laterality: N/A;  . 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 TOTAL KNEE ARTHROPLASTY Left 10/19/2016   Procedure: LEFT TOTAL KNEE ARTHROPLASTY;  Surgeon: Paralee Cancel, MD;  Location: [facility];  Service: Orthopedics;  Laterality: Left;  Marland Kitchen VARICOSE VEIN SURGERY  2007    Social History   Tobacco Use  . Smoking status: Never Smoker  . Smokeless tobacco: Never Used  Substance Use Topics  . Alcohol use: Yes    Comment: rarely  . Drug use: No     Medication list has been reviewed and updated.  Current Meds  Medication Sig  . acetaminophen (TYLENOL) 325 MG tablet Take 2 tablets (650 mg total) by mouth every 6 (six) hours as needed for mild pain (or Fever >/=  101).  . amLODipine (NORVASC) 5 MG tablet Take 1 tablet (5 mg total) by mouth daily.  . Cholecalciferol (VITAMIN D) 2000 units CAPS Take 2,000 Units by mouth daily.  . Cyanocobalamin (VITAMIN B 12 PO) Take 1 tablet by mouth daily.  Marland Kitchen docusate sodium (COLACE) 100 MG capsule Take 1 capsule (100 mg total) by mouth 2 (two) times  daily.  . enalapril (VASOTEC) 20 MG tablet TAKE 1 TABLET BY MOUTH 2 TIMES DAILY. (Patient taking differently: 1 tab daily)  . fluticasone (FLONASE) 50 MCG/ACT nasal spray PLACE 2 SPRAYS DAILY INTO BOTH NOSTRILS.  . hydrochlorothiazide (HYDRODIURIL) 25 MG tablet TAKE 1 TABLET DAILY BY MOUTH.  Marland Kitchen levothyroxine (SYNTHROID, LEVOTHROID) 25 MCG tablet TAKE 1 TABLET (25 MCG TOTAL) BY MOUTH DAILY BEFORE BREAKFAST.  Marland Kitchen omeprazole (PRILOSEC) 20 MG capsule Take 1 capsule (20 mg total) by mouth 2 (two) times daily.  . Pyridoxine HCl (VITAMIN B6 PO) Take 1 tablet by mouth daily.    PHQ 2/9 Scores 08/06/2017 08/06/2017 10/02/2016  PHQ - 2 Score 0 0 0  PHQ- 9 Score - 0 -    Physical Exam Vitals signs and nursing note reviewed.  Constitutional:      General: She is not in acute distress.    Appearance: She is well-developed.  HENT:     Head: Normocephalic and atraumatic.  Neck:     Musculoskeletal: Normal range of motion and neck supple.  Cardiovascular:     Rate and Rhythm: Normal rate and regular rhythm.     Pulses: Normal pulses.  Pulmonary:     Effort: Pulmonary effort is normal. No respiratory distress.  Musculoskeletal:     Right lower leg: No edema.     Left lower leg: No edema.  Lymphadenopathy:     Cervical: No cervical adenopathy.  Skin:    General: Skin is warm and dry.     Findings: No rash.  Neurological:     Mental Status: She is alert and oriented to person, place, and time.  Psychiatric:        Behavior: Behavior normal.        Thought Content: Thought content normal.     BP 122/76 (BP Location: Right Arm, Patient Position: Sitting, Cuff Size: Normal)   Pulse 70   Ht 5\' 3"  (1.6 m)   Wt 168 lb 9.6 oz (76.5 kg)   SpO2 100%   BMI 29.87 kg/m   Assessment and Plan: 1. Essential hypertension Controlled Continue amlodipine and vasotec   Partially dictated using Editor, commissioning. Any errors are unintentional.  Halina Maidens, MD [facility]  03/02/2018

## 2018-03-14 ENCOUNTER — Other Ambulatory Visit: Payer: Self-pay | Admitting: Internal Medicine

## 2018-03-14 DIAGNOSIS — K219 Gastro-esophageal reflux disease without esophagitis: Secondary | ICD-10-CM

## 2018-03-20 ENCOUNTER — Encounter: Payer: Self-pay | Admitting: Internal Medicine

## 2018-03-21 ENCOUNTER — Encounter: Payer: Self-pay | Admitting: Internal Medicine

## 2018-03-21 ENCOUNTER — Ambulatory Visit: Payer: 59 | Admitting: Internal Medicine

## 2018-03-21 ENCOUNTER — Telehealth: Payer: Self-pay | Admitting: Gastroenterology

## 2018-03-21 VITALS — BP 136/80 | HR 64 | Temp 97.5°F | Ht 63.0 in | Wt 168.0 lb

## 2018-03-21 DIAGNOSIS — K5733 Diverticulitis of large intestine without perforation or abscess with bleeding: Secondary | ICD-10-CM

## 2018-03-21 MED ORDER — METRONIDAZOLE 500 MG PO TABS: 500.0000 mg | ORAL_TABLET | Freq: Three times a day (TID) | ORAL | 0 refills | Status: AC

## 2018-03-21 MED ORDER — CIPROFLOXACIN HCL 500 MG PO TABS: 500.0000 mg | ORAL_TABLET | Freq: Two times a day (BID) | ORAL | 0 refills | Status: AC

## 2018-03-21 NOTE — Telephone Encounter (Signed)
PATIENT HAD SOME RECTAL BLEEDING YESTERDAY & THIS MORINING. DR Jonna Munro PUT HER ON ANTIBIOTIC'S TODAY. DOES DR Allen Norris NEED TO SEE HER?

## 2018-03-21 NOTE — Progress Notes (Signed)
Date:  03/21/2018   Name:  Julie Castillo   DOB:  06-21-1955   MRN:  [id number]   Chief Complaint: Rectal Bleeding (started yesterday am- only had one time- in stool and on paper)  Rectal Bleeding   The current episode started yesterday. The pain is mild. The stool is described as soft. Associated symptoms include abdominal pain. Pertinent negatives include no fever, no diarrhea, no vomiting, no chest pain, no headaches and no coughing.  She had a significant rectal bleed last year due to diverticuli. She has avoided nsaids, eats a high fiber diet. She saw a small amount of blood on her stool and on the tissue once yesterday and again this AM.   Review of colonoscopy - 2 polyps and multiple small mouth diverticuli in the sigmoid colon.   Review of Systems  Constitutional: Negative for chills, fatigue and fever.  Respiratory: Negative for cough, chest tightness, shortness of breath and wheezing.   Cardiovascular: Negative for chest pain, palpitations and leg swelling.  Gastrointestinal: Positive for abdominal pain, blood in stool and hematochezia. Negative for constipation, diarrhea and vomiting.  Neurological: Negative for dizziness and headaches.    Patient Active Problem List   Diagnosis Date Noted  . Shoulder tendonitis, left 01/18/2018  . Colon cancer screening   . Benign neoplasm of ascending colon   . Snoring 07/14/2017  . GIB (gastrointestinal bleeding) 07/08/2017  . Vitreous floaters of right eye 01/29/2017  . 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 Mixed hyperlipidemia 10/02/2016  . Lower extremity edema 03/27/2016  . Vitamin D deficiency 03/27/2016  . Seasonal allergies 03/27/2016  . Ptosis of eyelid, left 03/27/2016  . Essential hypertension 03/22/2015  . Gastroesophageal reflux disease without esophagitis 03/22/2015  . Hypothyroidism due to acquired atrophy of thyroid 03/22/2015  . 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 Sulfa Antibiotics Hives  .  Tape Other (See Comments) and Rash    Sensitive skin- can use paper tape bandaids- make skin red  Sensitive skin- can use paper tape    Past Surgical History:  Procedure Laterality Date  . BREAST CYST ASPIRATION Left 1988   "it just a fineneedle aspiration"   . COLONOSCOPY N/A 07/10/2017   Procedure: COLONOSCOPY;  Surgeon: Toledo, Benay Pike, MD;  Location: [facility];  Service: Gastroenterology;  Laterality: N/A;  . COLONOSCOPY WITH PROPOFOL N/A 11/11/2017   Procedure: COLONOSCOPY WITH PROPOFOL WITH BIOPSIES;  Surgeon: Lucilla Lame, MD;  Location: [place];  Service: Endoscopy;  Laterality: N/A;  . ESOPHAGOGASTRODUODENOSCOPY N/A 07/10/2017   Procedure: ESOPHAGOGASTRODUODENOSCOPY (EGD);  Surgeon: Toledo, Benay Pike, MD;  Location: [facility];  Service: Gastroenterology;  Laterality: N/A;  . EYE SURGERY  2001,2005   left eye surgery x 2 ( eyelid)   . POLYPECTOMY N/A 11/11/2017   Procedure: POLYPECTOMY;  Surgeon: Lucilla Lame, MD;  Location: [place];  Service: Endoscopy;  Laterality: N/A;  . 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 TOTAL KNEE ARTHROPLASTY Left 10/19/2016   Procedure: LEFT TOTAL KNEE ARTHROPLASTY;  Surgeon: Paralee Cancel, MD;  [facility];  Service: Orthopedics;  Laterality: Left;  Marland Kitchen VARICOSE VEIN SURGERY  2007    Social History   Tobacco Use  . Smoking status: Never Smoker  . Smokeless tobacco: Never Used  Substance Use Topics  . Alcohol use: Yes  Comment: rarely  . Drug use: No     Medication list has been reviewed and updated.  Current Meds  Medication Sig  . acetaminophen (TYLENOL) 325 MG tablet Take 2 tablets (650 mg total) by mouth every 6 (six) hours as needed for mild pain (or Fever >/= 101).  Marland Kitchen amLODipine (NORVASC) 5 MG tablet Take 1 tablet (5 mg total) by mouth daily.  .  Cholecalciferol (VITAMIN D) 2000 units CAPS Take 2,000 Units by mouth daily.  Marland Kitchen docusate sodium (COLACE) 100 MG capsule Take 1 capsule (100 mg total) by mouth 2 (two) times daily.  . enalapril (VASOTEC) 20 MG tablet Take 1 tablet (20 mg total) by mouth 2 (two) times daily.  . fluticasone (FLONASE) 50 MCG/ACT nasal spray PLACE 2 SPRAYS DAILY INTO BOTH NOSTRILS.  . hydrochlorothiazide (HYDRODIURIL) 25 MG tablet TAKE 1 TABLET DAILY BY MOUTH.  Marland Kitchen levothyroxine (SYNTHROID, LEVOTHROID) 25 MCG tablet TAKE 1 TABLET (25 MCG TOTAL) BY MOUTH DAILY BEFORE BREAKFAST.  Marland Kitchen omeprazole (PRILOSEC) 20 MG capsule TAKE 1 CAPSULE BY MOUTH TWICE DAILY    PHQ 2/9 Scores 08/06/2017 08/06/2017 10/02/2016  PHQ - 2 Score 0 0 0  PHQ- 9 Score - 0 -    Physical Exam Vitals signs and nursing note reviewed.  Constitutional:      General: She is not in acute distress.    Appearance: She is well-developed.  HENT:     Head: Normocephalic and atraumatic.  Neck:     Musculoskeletal: Normal range of motion and neck supple.  Cardiovascular:     Rate and Rhythm: Normal rate and regular rhythm.     Pulses: Normal pulses.     Heart sounds: Normal heart sounds.  Pulmonary:     Effort: Pulmonary effort is normal. No respiratory distress.  Abdominal:     General: Abdomen is flat.     Palpations: Abdomen is soft. There is no mass.     Tenderness: There is abdominal tenderness. There is no guarding or rebound.     Hernia: No hernia is present.  Musculoskeletal: Normal range of motion.  Skin:    General: Skin is warm and dry.     Findings: No rash.  Neurological:     Mental Status: She is alert and oriented to person, place, and time.  Psychiatric:        Behavior: Behavior normal.        Thought Content: Thought content normal.     BP 136/80   Pulse 64   Temp (!) 97.5 F (36.4 C) (Oral)   Ht 5\' 3"  (1.6 m)   Wt 168 lb (76.2 kg)   BMI 29.76 kg/m   Assessment and Plan: 1. Diverticulitis of large intestine without  perforation or abscess with bleeding Will message Dr. Allen Norris for his input Check CBC and start antibiotics - CBC with Differential/Platelet - ciprofloxacin (CIPRO) 500 MG tablet; Take 1 tablet (500 mg total) by mouth 2 (two) times daily for 7 days.  Dispense: 14 tablet; Refill: 0 - metroNIDAZOLE (FLAGYL) 500 MG tablet; Take 1 tablet (500 mg total) by mouth 3 (three) times daily for 7 days.  Dispense: 21 tablet; Refill: 0   Partially dictated using Editor, commissioning. Any errors are unintentional.  Halina Maidens, MD [facility]  03/21/2018

## 2018-03-21 NOTE — Telephone Encounter (Signed)
Sent pt a Mychart to pt regarding her symptoms. Advised her Dr. Allen Norris has been advised of her symptoms. She has also been advised to go ahead and start the antibiotic that was prescribed to her by her PCP and we will wait on the result of her CBC.

## 2018-03-22 LAB — CBC WITH DIFFERENTIAL/PLATELET
BASOS: 1 %
Basophils Absolute: 0.1 10*3/uL (ref 0.0–0.2)
EOS (ABSOLUTE): 0.1 10*3/uL (ref 0.0–0.4)
EOS: 1 %
HEMATOCRIT: 36.7 % (ref 34.0–46.6)
Hemoglobin: 12.4 g/dL (ref 11.1–15.9)
IMMATURE GRANS (ABS): 0 10*3/uL (ref 0.0–0.1)
IMMATURE GRANULOCYTES: 0 %
Lymphocytes Absolute: 1.6 10*3/uL (ref 0.7–3.1)
Lymphs: 24 %
MCH: 28.9 pg (ref 26.6–33.0)
MCHC: 33.8 g/dL (ref 31.5–35.7)
MCV: 86 fL (ref 79–97)
Monocytes Absolute: 0.6 10*3/uL (ref 0.1–0.9)
Monocytes: 9 %
NEUTROS PCT: 65 %
Neutrophils Absolute: 4.4 10*3/uL (ref 1.4–7.0)
Platelets: 263 10*3/uL (ref 150–450)
RBC: 4.29 x10E6/uL (ref 3.77–5.28)
RDW: 13.7 % (ref 11.7–15.4)
WBC: 6.7 10*3/uL (ref 3.4–10.8)

## 2018-03-24 ENCOUNTER — Encounter: Payer: Self-pay | Admitting: Internal Medicine

## 2018-03-28 ENCOUNTER — Encounter: Payer: Self-pay | Admitting: Internal Medicine

## 2018-03-28 NOTE — Telephone Encounter (Signed)
Please Advise patient My Chart message.

## 2018-05-09 ENCOUNTER — Other Ambulatory Visit: Payer: Self-pay | Admitting: Internal Medicine

## 2018-05-17 ENCOUNTER — Other Ambulatory Visit: Payer: Self-pay | Admitting: Internal Medicine

## 2018-05-18 MED FILL — HYDROCHLOROTHIAZIDE 25 MG T: 25 | 90 days supply | Qty: 90 | Fill #0

## 2018-05-19 MED FILL — LEVOTHYROXINE 25 MCG TABLET: 25 | 90 days supply | Qty: 90 | Fill #0

## 2018-06-06 MED FILL — OMEPRAZOLE 20 MG CPDR: 20 | 90 days supply | Qty: 180 | Fill #0

## 2018-08-11 ENCOUNTER — Other Ambulatory Visit: Payer: Self-pay | Admitting: Internal Medicine

## 2018-08-19 ENCOUNTER — Ambulatory Visit (INDEPENDENT_AMBULATORY_CARE_PROVIDER_SITE_OTHER): Payer: 59 | Admitting: Internal Medicine

## 2018-08-19 ENCOUNTER — Other Ambulatory Visit: Payer: Self-pay

## 2018-08-19 ENCOUNTER — Encounter: Payer: Self-pay | Admitting: Internal Medicine

## 2018-08-19 VITALS — BP 124/80 | HR 62 | Ht 63.0 in | Wt 161.0 lb

## 2018-08-19 DIAGNOSIS — I10 Essential (primary) hypertension: Secondary | ICD-10-CM | POA: Diagnosis not present

## 2018-08-19 DIAGNOSIS — E782 Mixed hyperlipidemia: Secondary | ICD-10-CM

## 2018-08-19 DIAGNOSIS — Z0000 Encounter for general adult medical examination without abnormal findings: Secondary | ICD-10-CM

## 2018-08-19 DIAGNOSIS — Z1231 Encounter for screening mammogram for malignant neoplasm of breast: Secondary | ICD-10-CM

## 2018-08-19 DIAGNOSIS — K219 Gastro-esophageal reflux disease without esophagitis: Secondary | ICD-10-CM | POA: Diagnosis not present

## 2018-08-19 DIAGNOSIS — E034 Atrophy of thyroid (acquired): Secondary | ICD-10-CM

## 2018-08-19 LAB — POCT URINALYSIS DIPSTICK
Bilirubin, UA: NEGATIVE
Blood, UA: NEGATIVE
Glucose, UA: NEGATIVE
Ketones, UA: NEGATIVE
Leukocytes, UA: NEGATIVE
Nitrite, UA: NEGATIVE
Protein, UA: NEGATIVE
Spec Grav, UA: 1.01 (ref 1.010–1.025)
Urobilinogen, UA: 0.2 E.U./dL
pH, UA: 6.5 (ref 5.0–8.0)

## 2018-08-19 NOTE — Progress Notes (Signed)
Date:  08/19/2018   Name:  Julie Castillo   DOB:  August 07, 1955   MRN:  [id number]   Chief Complaint: Annual Exam (Breast Exam.) Julie Castillo is a 63 y.o. female who presents today for her Complete Annual Exam. She feels fairly well. She reports exercising walking very little. She reports she is sleeping well. She denies breast issues.  Colonoscopy 10/2017 Mammogram 01/2018 Pap 2017 - neg with cotesting  Hypertension This is a chronic problem. The problem is controlled. Pertinent negatives include no chest pain, headaches, palpitations or shortness of breath. Past treatments include calcium channel blockers and ACE inhibitors. The current treatment provides significant improvement. Identifiable causes of hypertension include a thyroid problem.  Thyroid Problem Presents for follow-up visit. Patient reports no anxiety, constipation, diarrhea, fatigue, palpitations or tremors. The symptoms have been stable.  Gastroesophageal Reflux She complains of heartburn. She reports no abdominal pain, no chest pain, no coughing or no wheezing. The problem occurs occasionally. Pertinent negatives include no fatigue. She has tried a PPI for the symptoms. The treatment provided significant relief.    Review of Systems  Constitutional: Negative for chills, fatigue and fever.  HENT: Negative for congestion, hearing loss, tinnitus, trouble swallowing and voice change.   Eyes: Negative for visual disturbance.  Respiratory: Negative for cough, chest tightness, shortness of breath and wheezing.   Cardiovascular: Negative for chest pain, palpitations and leg swelling.  Gastrointestinal: Positive for heartburn. Negative for abdominal pain, constipation, diarrhea and vomiting.  Endocrine: Negative for polydipsia and polyuria.  Genitourinary: Negative for dysuria, frequency, genital sores, vaginal bleeding and vaginal discharge.  Musculoskeletal: Negative for arthralgias, gait problem and joint swelling.  Skin:  Negative for color change and rash.  Allergic/Immunologic: Negative for environmental allergies.  Neurological: Negative for dizziness, tremors, light-headedness and headaches.  Hematological: Negative for adenopathy. Does not bruise/bleed easily.  Psychiatric/Behavioral: Negative for dysphoric mood and sleep disturbance. The patient is not nervous/anxious.     Patient Active Problem List   Diagnosis Date Noted  . Shoulder tendonitis, left 01/18/2018  . Colon cancer screening   . Benign neoplasm of ascending colon   . Snoring 07/14/2017  . Vitreous floaters of right eye 01/29/2017  . 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 Mixed hyperlipidemia 10/02/2016  . Lower extremity edema 03/27/2016  . Vitamin D deficiency 03/27/2016  . Seasonal allergies 03/27/2016  . Ptosis of eyelid, left 03/27/2016  . Essential hypertension 03/22/2015  . Gastroesophageal reflux disease without esophagitis 03/22/2015  . Hypothyroidism due to acquired atrophy of thyroid 03/22/2015  . 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 Sulfa Antibiotics Hives  . Tape Other (See Comments) and Rash    Sensitive skin- can use paper tape bandaids- make skin red  Sensitive skin- can use paper tape    Past Surgical History:  Procedure Laterality Date  . BREAST CYST ASPIRATION Left 1988   "it just a fineneedle aspiration"   . COLONOSCOPY N/A 07/10/2017   Procedure: COLONOSCOPY;  Surgeon: Toledo, Benay Pike, MD;  Location: [facility];  Service: Gastroenterology;  Laterality: N/A;  . COLONOSCOPY WITH PROPOFOL N/A 11/11/2017   Procedure: COLONOSCOPY WITH PROPOFOL WITH BIOPSIES;  Surgeon: Lucilla Lame, MD;  Location: [facility];  Service: Endoscopy;  Laterality: N/A;  . ESOPHAGOGASTRODUODENOSCOPY N/A 07/10/2017   Procedure: ESOPHAGOGASTRODUODENOSCOPY (EGD);  Surgeon: Toledo, Benay Pike, MD;  Location: [facility];  Service: Gastroenterology;  Laterality: N/A;  . EYE SURGERY  2001,2005   left eye  surgery x 2 ( eyelid)   . POLYPECTOMY N/A 11/11/2017   Procedure: POLYPECTOMY;  Surgeon: Lucilla Lame, MD;  Location: [facility];  Service: Endoscopy;  Laterality: N/A;  . 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 TOTAL KNEE ARTHROPLASTY Left 10/19/2016   Procedure: LEFT TOTAL KNEE ARTHROPLASTY;  Surgeon: Paralee Cancel, MD;  Location: [facility];  Service: Orthopedics;  Laterality: Left;  Marland Kitchen VARICOSE VEIN SURGERY  2007    Social History   Tobacco Use  . Smoking status: Never Smoker  . Smokeless tobacco: Never Used  Substance Use Topics  . Alcohol use: Yes    Comment: rarely  . Drug use: No     Medication list has been reviewed and updated.  Current Meds  Medication Sig  . acetaminophen (TYLENOL) 325 MG tablet Take 2 tablets (650 mg total) by mouth every 6 (six) hours as needed for mild pain (or Fever >/= 101).  Marland Kitchen amLODipine (NORVASC) 5 MG tablet Take 1 tablet (5 mg total) by mouth daily.  . Cholecalciferol (VITAMIN D) 2000 units CAPS Take 2,000 Units by mouth daily.  . Cyanocobalamin (VITAMIN B 12 PO) Take 1 tablet by mouth daily.  Marland Kitchen docusate sodium (COLACE) 100 MG capsule Take 1 capsule (100 mg total) by mouth 2 (two) times daily.  . enalapril (VASOTEC) 20 MG tablet Take 1 tablet (20 mg total) by mouth 2 (two) times daily.  . fluticasone (FLONASE) 50 MCG/ACT nasal spray PLACE 2 SPRAYS DAILY INTO BOTH NOSTRILS.  . hydrochlorothiazide (HYDRODIURIL) 25 MG tablet TAKE 1 TABLET BY MOUTH DAILY  . levothyroxine (SYNTHROID) 25 MCG tablet TAKE 1 TABLET BY MOUTH DAILY BEFORE BREAKFAST.  Marland Kitchen omeprazole (PRILOSEC) 20 MG capsule TAKE 1 CAPSULE BY MOUTH TWICE DAILY  . Pyridoxine HCl (VITAMIN B6 PO) Take 1 tablet by mouth daily.    PHQ 2/9 Scores 08/19/2018 08/06/2017 08/06/2017 10/02/2016  PHQ - 2 Score 0 0 0 0  PHQ- 9 Score 5 - 0 -    BP Readings from  Last 3 Encounters:  08/19/18 124/80  03/21/18 136/80  03/02/18 122/76    Physical Exam Vitals signs and nursing note reviewed.  Constitutional:      General: She is not in acute distress.    Appearance: She is well-developed.  HENT:     Head: Normocephalic and atraumatic.     Right Ear: Tympanic membrane and ear canal normal.     Left Ear: Tympanic membrane and ear canal normal.     Nose:     Right Sinus: No maxillary sinus tenderness.     Left Sinus: No maxillary sinus tenderness.     Mouth/Throat:     Pharynx: Uvula midline.  Eyes:     General: No scleral icterus.       Right eye: No discharge.        Left eye: No discharge.     Conjunctiva/sclera: Conjunctivae normal.  Neck:     Musculoskeletal: Normal range of motion. No erythema.     Thyroid: No thyromegaly.     Vascular: No carotid bruit.  Cardiovascular:     Rate and Rhythm: Normal rate and regular rhythm.     Pulses: Normal pulses.     Heart sounds: Normal heart sounds.  Pulmonary:     Effort: Pulmonary effort is normal. No respiratory distress.     Breath sounds: No  wheezing.  Chest:     Breasts:        Right: No mass, nipple discharge, skin change or tenderness.        Left: No mass, nipple discharge, skin change or tenderness.  Abdominal:     General: Bowel sounds are normal.     Palpations: Abdomen is soft.     Tenderness: There is no abdominal tenderness.  Musculoskeletal: Normal range of motion.     Right lower leg: No edema.     Left lower leg: No edema.  Lymphadenopathy:     Cervical: No cervical adenopathy.  Skin:    General: Skin is warm and dry.     Capillary Refill: Capillary refill takes less than 2 seconds.     Findings: No rash.  Neurological:     Mental Status: She is alert and oriented to person, place, and time.     Cranial Nerves: No cranial nerve deficit.     Sensory: No sensory deficit.     Deep Tendon Reflexes: Reflexes are normal and symmetric.  Psychiatric:        Speech:  Speech normal.        Behavior: Behavior normal.        Thought Content: Thought content normal.     Wt Readings from Last 3 Encounters:  08/19/18 161 lb (73 kg)  03/21/18 168 lb (76.2 kg)  03/02/18 168 lb 9.6 oz (76.5 kg)    BP 124/80   Pulse 62   Ht 5\' 3"  (1.6 m)   Wt 161 lb (73 kg)   SpO2 100%   BMI 28.52 kg/m   Assessment and Plan: 1. Annual physical exam Normal exam Recommend regular exercise - POCT urinalysis dipstick  2. Encounter for screening mammogram for breast cancer - MM 3D SCREEN BREAST BILATERAL; Future  3. Essential hypertension controlled - Comprehensive metabolic panel  4. Hypothyroidism due to acquired atrophy of thyroid supplemented - TSH + free T4  5. Gastroesophageal reflux disease without esophagitis Stable with no sx and no recurrence of bleeding - CBC with Differential/Platelet  6. Mixed hyperlipidemia - Lipid panel   Partially dictated using Editor, commissioning. Any errors are unintentional.  Halina Maidens, MD [facility]  08/19/2018

## 2018-08-20 LAB — CBC WITH DIFFERENTIAL/PLATELET
Basophils Absolute: 0.1 10*3/uL (ref 0.0–0.2)
Basos: 2 %
EOS (ABSOLUTE): 0.1 10*3/uL (ref 0.0–0.4)
Eos: 1 %
Hematocrit: 41 % (ref 34.0–46.6)
Hemoglobin: 13.5 g/dL (ref 11.1–15.9)
Immature Grans (Abs): 0 10*3/uL (ref 0.0–0.1)
Immature Granulocytes: 0 %
Lymphocytes Absolute: 1.7 10*3/uL (ref 0.7–3.1)
Lymphs: 30 %
MCH: 27.8 pg (ref 26.6–33.0)
MCHC: 32.9 g/dL (ref 31.5–35.7)
MCV: 84 fL (ref 79–97)
Monocytes Absolute: 0.5 10*3/uL (ref 0.1–0.9)
Monocytes: 9 %
Neutrophils Absolute: 3.4 10*3/uL (ref 1.4–7.0)
Neutrophils: 58 %
Platelets: 263 10*3/uL (ref 150–450)
RBC: 4.86 x10E6/uL (ref 3.77–5.28)
RDW: 14.5 % (ref 11.7–15.4)
WBC: 5.8 10*3/uL (ref 3.4–10.8)

## 2018-08-20 LAB — COMPREHENSIVE METABOLIC PANEL
ALT: 11 IU/L (ref 0–32)
AST: 12 IU/L (ref 0–40)
Albumin/Globulin Ratio: 1.9 (ref 1.2–2.2)
Albumin: 4.6 g/dL (ref 3.8–4.8)
Alkaline Phosphatase: 84 IU/L (ref 39–117)
BUN/Creatinine Ratio: 22 (ref 12–28)
BUN: 17 mg/dL (ref 8–27)
Bilirubin Total: 0.4 mg/dL (ref 0.0–1.2)
CO2: 26 mmol/L (ref 20–29)
Calcium: 9.8 mg/dL (ref 8.7–10.3)
Chloride: 93 mmol/L — ABNORMAL LOW (ref 96–106)
Creatinine, Ser: 0.76 mg/dL (ref 0.57–1.00)
GFR calc Af Amer: 96 mL/min/{1.73_m2} (ref >59)
GFR calc non Af Amer: 83 mL/min/{1.73_m2} (ref >59)
Globulin, Total: 2.4 g/dL (ref 1.5–4.5)
Glucose: 90 mg/dL (ref 65–99)
Potassium: 4.4 mmol/L (ref 3.5–5.2)
Sodium: 134 mmol/L (ref 134–144)
Total Protein: 7 g/dL (ref 6.0–8.5)

## 2018-08-20 LAB — LIPID PANEL
Chol/HDL Ratio: 1.9 ratio (ref 0.0–4.4)
Cholesterol, Total: 191 mg/dL (ref 100–199)
HDL: 103 mg/dL (ref >39)
LDL Calculated: 80 mg/dL (ref 0–99)
Triglycerides: 39 mg/dL (ref 0–149)
VLDL Cholesterol Cal: 8 mg/dL (ref 5–40)

## 2018-08-20 LAB — TSH+FREE T4
Free T4: 1.39 ng/dL (ref 0.82–1.77)
TSH: 2.24 u[IU]/mL (ref 0.450–4.500)

## 2018-09-09 DIAGNOSIS — H2512 Age-related nuclear cataract, left eye: Secondary | ICD-10-CM | POA: Diagnosis not present

## 2018-12-27 ENCOUNTER — Other Ambulatory Visit: Payer: Self-pay | Admitting: Internal Medicine

## 2018-12-27 DIAGNOSIS — I10 Essential (primary) hypertension: Secondary | ICD-10-CM

## 2019-01-10 ENCOUNTER — Other Ambulatory Visit: Payer: Self-pay | Admitting: Internal Medicine

## 2019-02-06 ENCOUNTER — Ambulatory Visit
Admission: RE | Admit: 2019-02-06 | Discharge: 2019-02-06 | Disposition: A | Discharge status: Home / Self Care | Payer: 59 | Source: Ambulatory Visit | Attending: Internal Medicine | Admitting: Internal Medicine

## 2019-02-06 ENCOUNTER — Other Ambulatory Visit: Payer: Self-pay

## 2019-02-06 DIAGNOSIS — Z1231 Encounter for screening mammogram for malignant neoplasm of breast: Secondary | ICD-10-CM | POA: Insufficient documentation

## 2019-02-08 ENCOUNTER — Ambulatory Visit: Payer: 59 | Admitting: Internal Medicine

## 2019-02-08 ENCOUNTER — Other Ambulatory Visit: Payer: Self-pay

## 2019-02-08 ENCOUNTER — Encounter: Payer: Self-pay | Admitting: Internal Medicine

## 2019-02-08 VITALS — BP 134/84 | HR 80 | Ht 63.0 in | Wt 168.0 lb

## 2019-02-08 DIAGNOSIS — M533 Sacrococcygeal disorders, not elsewhere classified: Secondary | ICD-10-CM | POA: Diagnosis not present

## 2019-02-08 MED ORDER — PREDNISONE 10 MG PO TABS: 10.0000 mg | ORAL_TABLET | ORAL | 0 refills | Status: AC

## 2019-02-08 NOTE — Progress Notes (Signed)
Date:  02/08/2019   Name:  Julie Castillo   DOB:  1955-10-10   MRN:  [id number]   Chief Complaint: Leg Pain (Started Saturday afternoon. Hurts in the Left buttcheek. Not radiating into leg. Painful to walk. )  Back Pain This is a new problem. Episode onset: started saturday after making up her bed. The problem occurs constantly. The problem has been gradually improving since onset. The pain is present in the sacro-iliac. The quality of the pain is described as stabbing. The pain does not radiate. The pain is moderate. Exacerbated by: walking. Pertinent negatives include no bladder incontinence, bowel incontinence, chest pain, headaches, numbness or weakness. She has tried ice for the symptoms. The treatment provided mild relief.    Lab Results  Component Value Date   CREATININE 0.76 08/19/2018   BUN 17 08/19/2018   NA 134 08/19/2018   K 4.4 08/19/2018   CL 93 (L) 08/19/2018   CO2 26 08/19/2018   Lab Results  Component Value Date   CHOL 191 08/19/2018   HDL 103 08/19/2018   LDLCALC 80 08/19/2018   TRIG 39 08/19/2018   CHOLHDL 1.9 08/19/2018   Lab Results  Component Value Date   TSH 2.240 08/19/2018   No results found for: HGBA1C   Review of Systems  Constitutional: Negative for chills and fatigue.  Respiratory: Negative for chest tightness and shortness of breath.   Cardiovascular: Negative for chest pain.  Gastrointestinal: Negative for bowel incontinence.  Genitourinary: Negative for bladder incontinence.  Musculoskeletal: Positive for back pain, gait problem and myalgias.  Neurological: Negative for dizziness, weakness, numbness and headaches.    Patient Active Problem List   Diagnosis Date Noted  . Shoulder tendonitis, left 01/18/2018  . Colon cancer screening   . Benign neoplasm of ascending colon   . Snoring 07/14/2017  . Vitreous floaters of right eye 01/29/2017  . 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 Mixed hyperlipidemia 10/02/2016  . Lower extremity edema  03/27/2016  . Vitamin D deficiency 03/27/2016  . Seasonal allergies 03/27/2016  . Ptosis of eyelid, left 03/27/2016  . Essential hypertension 03/22/2015  . Gastroesophageal reflux disease without esophagitis 03/22/2015  . Hypothyroidism due to acquired atrophy of thyroid 03/22/2015  . 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 Sulfa Antibiotics Hives  . Tape Other (See Comments) and Rash    Sensitive skin- can use paper tape bandaids- make skin red  Sensitive skin- can use paper tape    Past Surgical History:  Procedure Laterality Date  . BREAST CYST ASPIRATION Left 1988   "it just a fineneedle aspiration"   . COLONOSCOPY N/A 07/10/2017   Procedure: COLONOSCOPY;  Surgeon: Toledo, Benay Pike, MD;  Location: [facility];  Service: Gastroenterology;  Laterality: N/A;  . COLONOSCOPY WITH PROPOFOL N/A 11/11/2017   Procedure: COLONOSCOPY WITH PROPOFOL WITH BIOPSIES;  Surgeon: Lucilla Lame, MD;  Location: [place];  Service: Endoscopy;  Laterality: N/A;  . ESOPHAGOGASTRODUODENOSCOPY N/A 07/10/2017   Procedure: ESOPHAGOGASTRODUODENOSCOPY (EGD);  Surgeon: Toledo, Benay Pike, MD;  Location: [facility];  Service: Gastroenterology;  Laterality: N/A;  . EYE SURGERY  2001,2005   left eye surgery x 2 ( eyelid)   . POLYPECTOMY N/A 11/11/2017   Procedure: POLYPECTOMY;  Surgeon: Lucilla Lame, MD;  Location: [place];  Service: Endoscopy;  Laterality: N/A;  . TOTAL KNEE ARTHROPLASTY Right 03/20/2014   Procedure: RIGHT TOTAL KNEE ARTHROPLASTY;  Surgeon: Mauri Pole, MD;  Location: [facility];  Service: Orthopedics;  Laterality: Right;  . TOTAL KNEE ARTHROPLASTY Left 10/19/2016   Procedure: LEFT TOTAL KNEE ARTHROPLASTY;  Surgeon: Paralee Cancel, MD;  Location: [facility];  Service: Orthopedics;  Laterality: Left;  Marland Kitchen VARICOSE VEIN SURGERY  2007    Social History   Tobacco Use  . Smoking status: Never Smoker  . Smokeless tobacco: Never Used  Substance Use Topics   . Alcohol use: Yes    Comment: rarely  . Drug use: No     Medication list has been reviewed and updated.  Current Meds  Medication Sig  . acetaminophen (TYLENOL) 325 MG tablet Take 2 tablets (650 mg total) by mouth every 6 (six) hours as needed for mild pain (or Fever >/= 101).  Marland Kitchen amLODipine (NORVASC) 5 MG tablet TAKE 1 TABLET BY MOUTH DAILY.  Marland Kitchen Cholecalciferol (VITAMIN D) 2000 units CAPS Take 2,000 Units by mouth daily.  . Cyanocobalamin (VITAMIN B 12 PO) Take 1 tablet by mouth daily.  Marland Kitchen docusate sodium (COLACE) 100 MG capsule Take 1 capsule (100 mg total) by mouth 2 (two) times daily.  . enalapril (VASOTEC) 20 MG tablet Take 1 tablet (20 mg total) by mouth 2 (two) times daily.  . fluticasone (FLONASE) 50 MCG/ACT nasal spray PLACE 2 SPRAYS DAILY INTO BOTH NOSTRILS.  . hydrochlorothiazide (HYDRODIURIL) 25 MG tablet TAKE 1 TABLET BY MOUTH DAILY  . levothyroxine (SYNTHROID) 25 MCG tablet TAKE 1 TABLET BY MOUTH DAILY BEFORE BREAKFAST.  Marland Kitchen omeprazole (PRILOSEC) 20 MG capsule TAKE 1 CAPSULE BY MOUTH TWICE DAILY  . Pyridoxine HCl (VITAMIN B6 PO) Take 1 tablet by mouth daily.    PHQ 2/9 Scores 02/08/2019 08/19/2018 08/06/2017 08/06/2017  PHQ - 2 Score 0 0 0 0  PHQ- 9 Score - 5 - 0    BP Readings from Last 3 Encounters:  02/08/19 134/84  08/19/18 124/80  03/21/18 136/80    Physical Exam Vitals and nursing note reviewed.  Constitutional:      General: She is not in acute distress.    Appearance: Normal appearance. She is well-developed.  HENT:     Head: Normocephalic and atraumatic.  Cardiovascular:     Rate and Rhythm: Normal rate and regular rhythm.  Pulmonary:     Effort: Pulmonary effort is normal. No respiratory distress.     Breath sounds: No wheezing or rhonchi.  Musculoskeletal:     Cervical back: Normal range of motion.     Lumbar back: Bony tenderness (over left SI region) present. No swelling, deformity or signs of trauma. Positive left straight leg raise test.  Skin:     General: Skin is warm and dry.     Findings: No rash.  Neurological:     Mental Status: She is alert and oriented to person, place, and time.  Psychiatric:        Attention and Perception: Attention normal.        Mood and Affect: Mood normal.        Behavior: Behavior normal.        Thought Content: Thought content normal.     Wt Readings from Last 3 Encounters:  02/08/19 168 lb (76.2 kg)  08/19/18 161 lb (73 kg)  03/21/18 168 lb (76.2 kg)    BP 134/84   Pulse 80   Ht 5\' 3"  (1.6 m)   Wt 168 lb (76.2 kg)   SpO2 98%   BMI 29.76 kg/m   Assessment and Plan: 1. Sacro-iliac pain Pt is unable to take  NSAIDS due to hx of GI bleed Recommend continuing ice, rest and will give steroid taper to be taken with food If no improvement, will refer to PT - predniSONE (DELTASONE) 10 MG tablet; Take 1 tablet (10 mg total) by mouth as directed for 6 days. Take 6,5,4,3,2,1 then stop  Dispense: 21 tablet; Refill: 0   Partially dictated using Editor, commissioning. Any errors are unintentional.  Halina Maidens, MD [facility]  02/08/2019

## 2019-02-13 ENCOUNTER — Other Ambulatory Visit: Payer: Self-pay | Admitting: Internal Medicine

## 2019-02-13 ENCOUNTER — Encounter: Payer: Self-pay | Admitting: Internal Medicine

## 2019-02-13 DIAGNOSIS — M533 Sacrococcygeal disorders, not elsewhere classified: Secondary | ICD-10-CM

## 2019-02-16 ENCOUNTER — Other Ambulatory Visit: Payer: Self-pay

## 2019-02-16 ENCOUNTER — Encounter: Payer: Self-pay | Admitting: Physical Therapy

## 2019-02-16 ENCOUNTER — Ambulatory Visit: Payer: 59 | Attending: Internal Medicine | Admitting: Physical Therapy

## 2019-02-16 DIAGNOSIS — M6281 Muscle weakness (generalized): Secondary | ICD-10-CM | POA: Diagnosis not present

## 2019-02-16 DIAGNOSIS — R269 Unspecified abnormalities of gait and mobility: Secondary | ICD-10-CM | POA: Diagnosis present

## 2019-02-16 DIAGNOSIS — M533 Sacrococcygeal disorders, not elsewhere classified: Secondary | ICD-10-CM | POA: Insufficient documentation

## 2019-02-16 DIAGNOSIS — M2560 Stiffness of unspecified joint, not elsewhere classified: Secondary | ICD-10-CM

## 2019-02-16 DIAGNOSIS — M545 Low back pain, unspecified: Secondary | ICD-10-CM

## 2019-02-16 DIAGNOSIS — G8929 Other chronic pain: Secondary | ICD-10-CM

## 2019-02-17 NOTE — Therapy (Signed)
[facility] [address] [place], [place], [address] Phone: [phone]   Fax:  [phone]  Physical Therapy Evaluation  Patient Details  Name: Julie Castillo MRN: [id number] Date of Birth: [birth year] Referring Provider (PT): Dr. Army Melia   Encounter Date: 02/16/2019  PT End of Session - 02/17/19 1547    Visit Number  1    Number of Visits  8    Date for PT Re-Evaluation  03/16/19    PT Start Time  0807    PT Stop Time  0902    PT Time Calculation (min)  55 min    Activity Tolerance  Patient tolerated treatment well;Patient limited by pain    Behavior During Therapy  [facility] for tasks assessed/performed       Past Medical History:  Diagnosis Date  . Arthritis   . Edema    lower extremities after standing a long time   . Family history of adverse reaction to anesthesia    father- post op became violent   . Foot drop, right 2016   developed foot drop following RTKA in 2016; had to wear foot drop shoe following discharge ; per pat "i had pain in my ankle real bad but it ended up being dx as foot drop"  . GERD (gastroesophageal reflux disease)   . GIB (gastrointestinal bleeding) 07/08/2017  . Hypertension   . Hypothyroidism   . Pneumonia    hx of   . PONV (postoperative nausea and vomiting)    post op day 1   . Varicose veins     Past Surgical History:  Procedure Laterality Date  . BREAST CYST ASPIRATION Left 1988   "it just a fineneedle aspiration"   . COLONOSCOPY N/A 07/10/2017   Procedure: COLONOSCOPY;  Surgeon: Toledo, Benay Pike, MD;  Location: [facility];  Service: Gastroenterology;  Laterality: N/A;  . COLONOSCOPY WITH PROPOFOL N/A 11/11/2017   Procedure: COLONOSCOPY WITH PROPOFOL WITH BIOPSIES;  Surgeon: Lucilla Lame, MD;  Location: [place];  Service: Endoscopy;  Laterality: N/A;  . ESOPHAGOGASTRODUODENOSCOPY N/A 07/10/2017   Procedure: ESOPHAGOGASTRODUODENOSCOPY (EGD);  Surgeon: Toledo, Benay Pike, MD;   Location: [facility];  Service: Gastroenterology;  Laterality: N/A;  . EYE SURGERY  2001,2005   left eye surgery x 2 ( eyelid)   . POLYPECTOMY N/A 11/11/2017   Procedure: POLYPECTOMY;  Surgeon: Lucilla Lame, MD;  Location: [place];  Service: Endoscopy;  Laterality: N/A;  . 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 TOTAL KNEE ARTHROPLASTY Left 10/19/2016   Procedure: LEFT TOTAL KNEE ARTHROPLASTY;  Surgeon: Paralee Cancel, MD;  Location: [facility];  Service: Orthopedics;  Laterality: Left;  Marland Kitchen VARICOSE VEIN SURGERY  2007    There were no vitals filed for this visit.   Subjective Assessment - 02/17/19 1533    Subjective  Pt. states she started having sharp increase in L low back pain with no reported MOI.  Pt. states she is unable to take Ibuprofen due to GI issues.  Pt. tried Prednisone taper with minimal improvment.  Pt. reports 5/10 L superior gluteal/ piriformis pain.  Pt. states lying in bed hurts (R sidesleeper) and walking/ sitting helps.    Pertinent History  Pt. known to PT clinic.    Limitations  Sitting;Lifting;Standing;Walking;House hold activities    Patient Stated Goals  Decrease back pain  Currently in Pain?  Yes    Pain Score  5     Pain Location  Back    Pain Orientation  Left    Pain Descriptors / Indicators  Aching    Pain Type  Acute pain    Pain Onset  1 to 4 weeks ago    Pain Frequency  Intermittent    Aggravating Factors   movement/ lying in bed/ repetitive tasks    Pain Relieving Factors  sitting/ walking.         [facility] PT Assessment - 02/17/19 0001      Assessment   Medical Diagnosis  Sacroiliac pain    Referring Provider (PT)  Dr. Army Melia    Onset Date/Surgical Date  02/04/19    Prior Therapy  yes for TKAs      [place] residence    Living Arrangements   Spouse/significant other    Home Access  Stairs to enter    Entrance Stairs-Number of Steps  4      Prior Function   Level of Independence  Independent          Objective measurements completed on examination: See above findings.     See HEP MH to low back during lumbar/gluteal assessment Ice to low back in supine after tx. Session/ there.ex.     PT Education - 02/17/19 1546    Education provided  Yes    Education Details  Piriformis stretches/ TrA muscle activation    Person(s) Educated  Patient    Methods  Explanation;Demonstration;Handout    Comprehension  Verbalized understanding;Returned demonstration          PT Long Term Goals - 02/17/19 1603      PT LONG TERM GOAL #1   Title  Pt. will increase FOTO to 73 to improve pain-free mobility.    Baseline  FOTO: initial 51    Time  4    Period  Weeks    Status  New    Target Date  03/16/19      PT LONG TERM GOAL #2   Title  Pt. independent with HEP to increase B hamstring/ piriformis flexibility to WNL to improve pain-free mobility.    Baseline  Hamstring: L 53 deg./ R 58 deg.  B piriformis tightness noted with L pain limited.    Time  4    Period  Weeks    Status  New    Target Date  03/16/19      PT LONG TERM GOAL #3   Title  Pt. will able to roll in bed/ stand with no increase c/o L sided back pain to improve daily tasks.    Baseline  5/10 pain with bed mobility/ sit to stands/ initial steps in walking.    Time  4    Period  Weeks    Status  New    Target Date  03/16/19      PT LONG TERM GOAL #4   Title  Pt. will ambulate with normalized gait pattern/ upright posture to improve pain-free mobility.    Baseline  Pt. ambulates with increase lateral sway while walking around PT clinic. Instructed in use of SPC on R to offset L back pain during gait/stairs    Time  4    Period  Weeks    Status  New    Target Date  03/16/19      PT LONG TERM GOAL #5   Title  Pt. able to ascend/descend 8 step with  recip. pattern and use of 1 handrail safely to improve mobility.      Baseline  L back/gluteal pain    Time  4    Period  Weeks    Status  New      PT LONG TERM GOAL #6   Title  Pt. able to return to work with no L knee pain or limitations safely.    Baseline  Pt. able to demonstrate work-related tasks with no L knee pain or limitations.      Time  4    Period  Weeks    Status  New    Target Date  03/16/19         Plan - 02/17/19 1548    Clinical Impression Statement  Pt. is a pleasant 63 y/o female with c/o L SI/gluteal pain starting on 02/04/2019 with no reported MOI.  Pt. reports 5/10 L superior gluteal pain with movement in bed/ sleeping position and repetitive tasks.  Pt. benefits from sitting and walking to decrease symptoms.  Pt. presents with limited lumbar AROM: flexion 25% limited, extension 50% (pain), rotn./lateral flexion 25% limited (increase pain with L rotn.).  PT is able to reproduce pt. L gluteal pain with palpation.  No radicular symptoms noted during evaluation.  B LE muscle strength grossly 5/5 MMT except hip flexion 4/5 MMT with increase pain during resisted L hip flexion.  Good alignment symmetry of ASIS/ PSIS.  No LLD.  Pt. ambulates with increase lateral sway while walking around PT clinic.  Instructed in use of SPC on R to offset L back pain during gait/stairs.  FOTO: initial 51/ goal 73.  Pt. will benefit from skilled PT services to increase LE flexibility/ core strengthening to improve pain-free mobility.    Stability/Clinical Decision Making  Evolving/Moderate complexity    Clinical Decision Making  Moderate    Rehab Potential  Good    PT Frequency  2x / week    PT Duration  4 weeks    PT Treatment/Interventions  ADLs/Self Care Home Management;Cryotherapy;Balance training;Therapeutic exercise;Therapeutic activities;Functional mobility training;Stair training;Gait training;Neuromuscular re-education;Patient/family education;Passive range of motion;Scar  mobilization;Manual techniques;Electrical Stimulation;Moist Heat;Dry needling    PT Next Visit Plan  Reassess L SI/ gluteal pain/ progress HEP    PT Home Exercise Plan  see HEP    Consulted and Agree with Plan of Care  Patient       Patient will benefit from skilled therapeutic intervention in order to improve the following deficits and impairments:  Abnormal gait, Pain, Decreased scar mobility, Decreased mobility, Decreased activity tolerance, Decreased endurance, Decreased range of motion, Decreased strength, Hypomobility, Impaired flexibility, Increased edema, Difficulty walking, Decreased balance, Postural dysfunction  Visit Diagnosis: Chronic left sacroiliac pain  Acute left-sided low back pain without sciatica  Joint stiffness  Muscle weakness (generalized)  Gait difficulty     Problem List Patient Active Problem List   Diagnosis Date Noted  . Shoulder tendonitis, left 01/18/2018  . Colon cancer screening   . Benign neoplasm of ascending colon   . Snoring 07/14/2017  . Vitreous floaters of right eye 01/29/2017  . 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 Mixed hyperlipidemia 10/02/2016  . Lower extremity edema 03/27/2016  . Vitamin D deficiency 03/27/2016  . Seasonal allergies 03/27/2016  . Ptosis of eyelid, left 03/27/2016  . Essential hypertension 03/22/2015  . Gastroesophageal reflux disease without esophagitis 03/22/2015  . Hypothyroidism due to acquired atrophy of thyroid 03/22/2015  . S/P  right TKA 03/20/2014   Pura Spice, PT, DPT # [id number] 02/17/2019, 4:10 PM  [place] [facility] [address] [place], [place], [address] Phone: [phone]   Fax:  [phone]  Name: Julie Castillo MRN: [id number] Date of Birth: 10/04/1955

## 2019-02-21 ENCOUNTER — Ambulatory Visit: Payer: 59 | Admitting: Physical Therapy

## 2019-02-22 ENCOUNTER — Other Ambulatory Visit: Payer: Self-pay

## 2019-02-22 ENCOUNTER — Ambulatory Visit: Payer: 59 | Admitting: Physical Therapy

## 2019-02-22 DIAGNOSIS — M6281 Muscle weakness (generalized): Secondary | ICD-10-CM | POA: Diagnosis not present

## 2019-02-22 DIAGNOSIS — R269 Unspecified abnormalities of gait and mobility: Secondary | ICD-10-CM

## 2019-02-22 DIAGNOSIS — M533 Sacrococcygeal disorders, not elsewhere classified: Secondary | ICD-10-CM | POA: Diagnosis not present

## 2019-02-22 DIAGNOSIS — M2560 Stiffness of unspecified joint, not elsewhere classified: Secondary | ICD-10-CM

## 2019-02-22 DIAGNOSIS — M545 Low back pain, unspecified: Secondary | ICD-10-CM

## 2019-02-22 DIAGNOSIS — G8929 Other chronic pain: Secondary | ICD-10-CM

## 2019-02-23 ENCOUNTER — Ambulatory Visit: Payer: 59 | Admitting: Physical Therapy

## 2019-02-23 NOTE — Therapy (Addendum)
[facility] [address] [place], [place], [address] Phone: [phone]   Fax:  [phone]  Physical Therapy Treatment  Patient Details  Name: Julie Castillo MRN: [id number] Date of Birth: 02/15/1956 Referring Provider (PT): Dr. Army Melia   Encounter Date: 02/22/2019  PT End of Session - 02/23/19 2018    Visit Number  2    Number of Visits  8    Date for PT Re-Evaluation  03/16/19    PT Start Time  [id number]    PT Stop Time  1518    PT Time Calculation (min)  47 min    Activity Tolerance  Patient tolerated treatment well;Patient limited by pain    Behavior During Therapy  [facility] for tasks assessed/performed       Past Medical History:  Diagnosis Date  . Arthritis   . Edema    lower extremities after standing a long time   . Family history of adverse reaction to anesthesia    father- post op became violent   . Foot drop, right 2016   developed foot drop following RTKA in 2016; had to wear foot drop shoe following discharge ; per pat "i had pain in my ankle real bad but it ended up being dx as foot drop"  . GERD (gastroesophageal reflux disease)   . GIB (gastrointestinal bleeding) 07/08/2017  . Hypertension   . Hypothyroidism   . Pneumonia    hx of   . PONV (postoperative nausea and vomiting)    post op day 1   . Varicose veins     Past Surgical History:  Procedure Laterality Date  . BREAST CYST ASPIRATION Left 1988   "it just a fineneedle aspiration"   . COLONOSCOPY N/A 07/10/2017   Procedure: COLONOSCOPY;  Surgeon: Toledo, Benay Pike, MD;  Location: [facility];  Service: Gastroenterology;  Laterality: N/A;  . COLONOSCOPY WITH PROPOFOL N/A 11/11/2017   Procedure: COLONOSCOPY WITH PROPOFOL WITH BIOPSIES;  Surgeon: Lucilla Lame, MD;  Location: [place];  Service: Endoscopy;  Laterality: N/A;  . ESOPHAGOGASTRODUODENOSCOPY N/A 07/10/2017   Procedure: ESOPHAGOGASTRODUODENOSCOPY (EGD);  Surgeon: Toledo, Benay Pike, MD;   Location: [facility];  Service: Gastroenterology;  Laterality: N/A;  . EYE SURGERY  2001,2005   left eye surgery x 2 ( eyelid)   . POLYPECTOMY N/A 11/11/2017   Procedure: POLYPECTOMY;  Surgeon: Lucilla Lame, MD;  Location: [place];  Service: Endoscopy;  Laterality: N/A;  . 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 TOTAL KNEE ARTHROPLASTY Left 10/19/2016   Procedure: LEFT TOTAL KNEE ARTHROPLASTY;  Surgeon: Paralee Cancel, MD;  Location: [facility];  Service: Orthopedics;  Laterality: Left;  Marland Kitchen VARICOSE VEIN SURGERY  2007    There were no vitals filed for this visit.  Subjective Assessment - 02/24/19 0828    Subjective  Pt. states she is feeling a little better in low back since intial PT evaluation.  Pt. using SPC on L side (PT corrected SPC use to R side).    Pertinent History  Pt. known to PT clinic.    Limitations  Sitting;Lifting;Standing;Walking;House hold activities    Patient Stated Goals  Decrease back pain    Currently in Pain?  Yes    Pain Score  3     Pain Location  Back    Pain Orientation  Left  Pain Onset  1 to 4 weeks ago        There.ex.:  Reviewed/ discussed HEP Supine TrA activation: marching/ hip abduction 10x  Manual tx.:  Supine LE/lumbar stretches (focus on L piriformis/ trunk rotn.)- static holds as tolerated.  Sidelying/ prone STM to low back/ B gluts/ piriformis (benefits from [employer])  Discussed use of ice vs. heat    PT Long Term Goals - 02/17/19 1603      PT LONG TERM GOAL #1   Title  Pt. will increase FOTO to 73 to improve pain-free mobility.    Baseline  FOTO: initial 51    Time  4    Period  Weeks    Status  New    Target Date  03/16/19      PT LONG TERM GOAL #2   Title  Pt. independent with HEP to increase B hamstring/ piriformis flexibility to WNL to improve pain-free mobility.     Baseline  Hamstring: L 53 deg./ R 58 deg.  B piriformis tightness noted with L pain limited.    Time  4    Period  Weeks    Status  New    Target Date  03/16/19      PT LONG TERM GOAL #3   Title  Pt. will able to roll in bed/ stand with no increase c/o L sided back pain to improve daily tasks.    Baseline  5/10 pain with bed mobility/ sit to stands/ initial steps in walking.    Time  4    Period  Weeks    Status  New    Target Date  03/16/19      PT LONG TERM GOAL #4   Title  Pt. will ambulate with normalized gait pattern/ upright posture to improve pain-free mobility.    Baseline  Pt. ambulates with increase lateral sway while walking around PT clinic. Instructed in use of SPC on R to offset L back pain during gait/stairs    Time  4    Period  Weeks    Status  New    Target Date  03/16/19      PT LONG TERM GOAL #5   Title  Pt. able to ascend/descend 8 step with recip. pattern and use of 1 handrail safely to improve mobility.      Baseline  L back/gluteal pain    Time  4    Period  Weeks    Status  New      PT LONG TERM GOAL #6   Title  Pt. able to return to work with no L knee pain or limitations safely.    Baseline  Pt. able to demonstrate work-related tasks with no L knee pain or limitations.      Time  4    Period  Weeks    Status  New    Target Date  03/16/19            Plan - 02/24/19 0829    Clinical Impression Statement  Pt. presents with decrease tenderness to L gluteal region/ piriformis muscle.  Pt. ambulates with improved gait pattern and benefits from use of SPC on R side to decrease L back/ SI discomfort with walking.  Pt. will continue with stretching/ core based ex. and instructed to contact PT if any qustions.    Stability/Clinical Decision Making  Evolving/Moderate complexity    Clinical Decision Making  Moderate    Rehab Potential  Good  PT Frequency  2x / week    PT Duration  4 weeks    PT Treatment/Interventions  ADLs/Self Care Home  Management;Cryotherapy;Balance training;Therapeutic exercise;Therapeutic activities;Functional mobility training;Stair training;Gait training;Neuromuscular re-education;Patient/family education;Passive range of motion;Scar mobilization;Manual techniques;Electrical Stimulation;Moist Heat;Dry needling    PT Next Visit Plan  Reassess L SI/ gluteal pain/ progress HEP    PT Home Exercise Plan  see HEP    Consulted and Agree with Plan of Care  Patient       Patient will benefit from skilled therapeutic intervention in order to improve the following deficits and impairments:  Abnormal gait, Pain, Decreased scar mobility, Decreased mobility, Decreased activity tolerance, Decreased endurance, Decreased range of motion, Decreased strength, Hypomobility, Impaired flexibility, Increased edema, Difficulty walking, Decreased balance, Postural dysfunction  Visit Diagnosis: Chronic left sacroiliac pain  Acute left-sided low back pain without sciatica  Joint stiffness  Muscle weakness (generalized)  Gait difficulty     Problem List Patient Active Problem List   Diagnosis Date Noted  . Shoulder tendonitis, left 01/18/2018  . Colon cancer screening   . Benign neoplasm of ascending colon   . Snoring 07/14/2017  . Vitreous floaters of right eye 01/29/2017  . 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 Mixed hyperlipidemia 10/02/2016  . Lower extremity edema 03/27/2016  . Vitamin D deficiency 03/27/2016  . Seasonal allergies 03/27/2016  . Ptosis of eyelid, left 03/27/2016  . Essential hypertension 03/22/2015  . Gastroesophageal reflux disease without esophagitis 03/22/2015  . Hypothyroidism due to acquired atrophy of thyroid 03/22/2015  . S/P right TKA 03/20/2014   Pura Spice, PT, DPT # [id number] 02/24/2019, 8:35 AM  [facility] [address] [place], [place], [address] Phone: [phone]   Fax:  [phone]  Name: Julie Castillo MRN: [id number] Date of Birth:  1955-12-11

## 2019-02-24 ENCOUNTER — Encounter: Payer: Self-pay | Admitting: Physical Therapy

## 2019-02-28 ENCOUNTER — Encounter: Payer: Self-pay | Admitting: Internal Medicine

## 2019-03-02 ENCOUNTER — Ambulatory Visit: Payer: 59 | Admitting: Physical Therapy

## 2019-03-06 ENCOUNTER — Other Ambulatory Visit: Payer: Self-pay | Admitting: Internal Medicine

## 2019-03-06 DIAGNOSIS — K219 Gastro-esophageal reflux disease without esophagitis: Secondary | ICD-10-CM

## 2019-03-09 ENCOUNTER — Encounter: Payer: Self-pay | Admitting: Physical Therapy

## 2019-03-09 ENCOUNTER — Ambulatory Visit: Payer: 59 | Attending: Internal Medicine | Admitting: Physical Therapy

## 2019-03-09 ENCOUNTER — Other Ambulatory Visit: Payer: Self-pay

## 2019-03-09 DIAGNOSIS — M545 Low back pain, unspecified: Secondary | ICD-10-CM

## 2019-03-09 DIAGNOSIS — R269 Unspecified abnormalities of gait and mobility: Secondary | ICD-10-CM | POA: Insufficient documentation

## 2019-03-09 DIAGNOSIS — M6281 Muscle weakness (generalized): Secondary | ICD-10-CM | POA: Insufficient documentation

## 2019-03-09 DIAGNOSIS — M533 Sacrococcygeal disorders, not elsewhere classified: Secondary | ICD-10-CM | POA: Insufficient documentation

## 2019-03-09 DIAGNOSIS — M2560 Stiffness of unspecified joint, not elsewhere classified: Secondary | ICD-10-CM | POA: Insufficient documentation

## 2019-03-09 DIAGNOSIS — G8929 Other chronic pain: Secondary | ICD-10-CM | POA: Insufficient documentation

## 2019-03-09 NOTE — Therapy (Addendum)
[facility] [address] [place], [place], [address] Phone: [phone]   Fax:  [phone]  Physical Therapy Treatment  Patient Details  Name: Julie Castillo MRN: [id number] Date of Birth: May 16, 1955 Referring Provider (PT): Dr. Army Melia   Encounter Date: 03/09/2019  PT End of Session - 03/10/19 0909    Visit Number  3    Number of Visits  8    Date for PT Re-Evaluation  03/16/19    PT Start Time  [id number]    PT Stop Time  1121    PT Time Calculation (min)  37 min    Activity Tolerance  Patient tolerated treatment well;Patient limited by pain    Behavior During Therapy  [facility] for tasks assessed/performed       Past Medical History:  Diagnosis Date  . Arthritis   . Edema    lower extremities after standing a long time   . Family history of adverse reaction to anesthesia    father- post op became violent   . Foot drop, right 2016   developed foot drop following RTKA in 2016; had to wear foot drop shoe following discharge ; per pat "i had pain in my ankle real bad but it ended up being dx as foot drop"  . GERD (gastroesophageal reflux disease)   . GIB (gastrointestinal bleeding) 07/08/2017  . Hypertension   . Hypothyroidism   . Pneumonia    hx of   . PONV (postoperative nausea and vomiting)    post op day 1   . Varicose veins     Past Surgical History:  Procedure Laterality Date  . BREAST CYST ASPIRATION Left 1988   "it just a fineneedle aspiration"   . COLONOSCOPY N/A 07/10/2017   Procedure: COLONOSCOPY;  Surgeon: Toledo, Benay Pike, MD;  Location: [facility];  Service: Gastroenterology;  Laterality: N/A;  . COLONOSCOPY WITH PROPOFOL N/A 11/11/2017   Procedure: COLONOSCOPY WITH PROPOFOL WITH BIOPSIES;  Surgeon: Lucilla Lame, MD;  Location: [place];  Service: Endoscopy;  Laterality: N/A;  . ESOPHAGOGASTRODUODENOSCOPY N/A 07/10/2017   Procedure: ESOPHAGOGASTRODUODENOSCOPY (EGD);  Surgeon: Toledo, Benay Pike, MD;   Location: [facility];  Service: Gastroenterology;  Laterality: N/A;  . EYE SURGERY  2001,2005   left eye surgery x 2 ( eyelid)   . POLYPECTOMY N/A 11/11/2017   Procedure: POLYPECTOMY;  Surgeon: Lucilla Lame, MD;  Location: [place];  Service: Endoscopy;  Laterality: N/A;  . 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 TOTAL KNEE ARTHROPLASTY Left 10/19/2016   Procedure: LEFT TOTAL KNEE ARTHROPLASTY;  Surgeon: Paralee Cancel, MD;  Location: [facility];  Service: Orthopedics;  Laterality: Left;  Marland Kitchen VARICOSE VEIN SURGERY  2007    There were no vitals filed for this visit.  Subjective Assessment - 03/10/19 0907    Subjective  Pt. states she has been getting better each week.  Pt. entered with use of SPC and minimal to no c/o back/ SI pain.    Pertinent History  Pt. known to PT clinic.    Limitations  Sitting;Lifting;Standing;Walking;House hold activities    Patient Stated Goals  Decrease back pain    Currently in Pain?  No/denies    Pain Onset  1 to 4 weeks ago            There.ex.:  Reviewed/ discussed HEP  No charge for tx. Session.  Discussed goals.     PT Long Term Goals - 02/17/19 1603      PT LONG TERM GOAL #1   Title  Pt. will increase FOTO to 73 to improve pain-free mobility.    Baseline  FOTO: initial 51    Time  4    Period  Weeks    Status  New    Target Date  03/16/19      PT LONG TERM GOAL #2   Title  Pt. independent with HEP to increase B hamstring/ piriformis flexibility to WNL to improve pain-free mobility.    Baseline  Hamstring: L 53 deg./ R 58 deg.  B piriformis tightness noted with L pain limited.    Time  4    Period  Weeks    Status  New    Target Date  03/16/19      PT LONG TERM GOAL #3   Title  Pt. will able to roll in bed/ stand with no increase c/o L sided back pain to improve daily tasks.     Baseline  5/10 pain with bed mobility/ sit to stands/ initial steps in walking.    Time  4    Period  Weeks    Status  New    Target Date  03/16/19      PT LONG TERM GOAL #4   Title  Pt. will ambulate with normalized gait pattern/ upright posture to improve pain-free mobility.    Baseline  Pt. ambulates with increase lateral sway while walking around PT clinic. Instructed in use of SPC on R to offset L back pain during gait/stairs    Time  4    Period  Weeks    Status  New    Target Date  03/16/19      PT LONG TERM GOAL #5   Title  Pt. able to ascend/descend 8 step with recip. pattern and use of 1 handrail safely to improve mobility.      Baseline  L back/gluteal pain    Time  4    Period  Weeks    Status  New      PT LONG TERM GOAL #6   Title  Pt. able to return to work with no L knee pain or limitations safely.    Baseline  Pt. able to demonstrate work-related tasks with no L knee pain or limitations.      Time  4    Period  Weeks    Status  New    Target Date  03/16/19        Discharge from PT at this time.  Pt. will continue with consistent stretching program and use of SPC as needed.  Pt. instructed to contact PT if SI/ L low back pain persists.     Plan - 03/10/19 0910    Stability/Clinical Decision Making  Evolving/Moderate complexity    Rehab Potential  Good    PT Frequency  2x / week    PT Duration  4 weeks    PT Treatment/Interventions  ADLs/Self Care Home Management;Cryotherapy;Balance training;Therapeutic exercise;Therapeutic activities;Functional mobility training;Stair training;Gait training;Neuromuscular re-education;Patient/family education;Passive range of motion;Scar mobilization;Manual techniques;Electrical Stimulation;Moist Heat;Dry needling    PT Next Visit Plan  Reassess L SI/ gluteal pain/ progress HEP    PT Home Exercise Plan  see HEP    Consulted and Agree with Plan of Care  Patient       Patient will benefit from skilled  therapeutic  intervention in order to improve the following deficits and impairments:  Abnormal gait, Pain, Decreased scar mobility, Decreased mobility, Decreased activity tolerance, Decreased endurance, Decreased range of motion, Decreased strength, Hypomobility, Impaired flexibility, Increased edema, Difficulty walking, Decreased balance, Postural dysfunction  Visit Diagnosis: Chronic left sacroiliac pain  Acute left-sided low back pain without sciatica  Joint stiffness  Muscle weakness (generalized)  Gait difficulty     Problem List Patient Active Problem List   Diagnosis Date Noted  . Shoulder tendonitis, left 01/18/2018  . Colon cancer screening   . Benign neoplasm of ascending colon   . Snoring 07/14/2017  . Vitreous floaters of right eye 01/29/2017  . 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 Mixed hyperlipidemia 10/02/2016  . Lower extremity edema 03/27/2016  . Vitamin D deficiency 03/27/2016  . Seasonal allergies 03/27/2016  . Ptosis of eyelid, left 03/27/2016  . Essential hypertension 03/22/2015  . Gastroesophageal reflux disease without esophagitis 03/22/2015  . Hypothyroidism due to acquired atrophy of thyroid 03/22/2015  . S/P right TKA 03/20/2014   Pura Spice, PT, DPT # [phone] 03/10/2019, 4:25 PM  [facility] [address] [place], [place], [address] Phone: [phone]   Fax:  [phone]  Name: UCHENNA GOURD MRN: [id number] Date of Birth: 24-Mar-1955

## 2019-03-16 ENCOUNTER — Encounter: Payer: 59 | Admitting: Physical Therapy

## 2019-03-23 ENCOUNTER — Encounter: Payer: 59 | Admitting: Physical Therapy

## 2019-04-03 ENCOUNTER — Other Ambulatory Visit: Payer: Self-pay | Admitting: Internal Medicine

## 2019-04-03 DIAGNOSIS — I10 Essential (primary) hypertension: Secondary | ICD-10-CM

## 2019-04-27 ENCOUNTER — Other Ambulatory Visit: Payer: Self-pay | Admitting: Family Medicine

## 2019-06-27 ENCOUNTER — Other Ambulatory Visit: Payer: Self-pay | Admitting: Internal Medicine

## 2019-06-27 NOTE — Telephone Encounter (Signed)
Requested medication (s) are due for refill today: Yes  Requested medication (s) are on the active medication list: Yes  Last refill:  05/17/18  Future visit scheduled: Yes  Notes to clinic:  Prescription has expired.    Requested Prescriptions  Pending Prescriptions Disp Refills   hydrochlorothiazide (HYDRODIURIL) 25 MG tablet [Pharmacy Med Name: HYDROCHLOROTHIAZIDE 25 MG T 25 Tablet] 90 tablet 2    Sig: TAKE 1 TABLET BY MOUTH ONCE DAILY      Cardiovascular: Diuretics - Thiazide Passed - 06/27/2019  7:51 AM      Passed - Ca in normal range and within 360 days    Calcium  Date Value Ref Range Status  08/19/2018 9.8 8.7 - 10.3 mg/dL Final   Calcium, Total  Date Value Ref Range Status  09/21/2012 9.3 8.5 - 10.1 mg/dL Final          Passed - Cr in normal range and within 360 days    Creatinine  Date Value Ref Range Status  09/21/2012 0.93 0.60 - 1.30 mg/dL Final   Creatinine, Ser  Date Value Ref Range Status  08/19/2018 0.76 0.57 - 1.00 mg/dL Final          Passed - K in normal range and within 360 days    Potassium  Date Value Ref Range Status  08/19/2018 4.4 3.5 - 5.2 mmol/L Final  09/21/2012 3.9 3.5 - 5.1 mmol/L Final          Passed - Na in normal range and within 360 days    Sodium  Date Value Ref Range Status  08/19/2018 134 134 - 144 mmol/L Final  09/21/2012 140 136 - 145 mmol/L Final          Passed - Last BP in normal range    BP Readings from Last 1 Encounters:  02/08/19 134/84          Passed - Valid encounter within last 6 months    Recent Outpatient Visits           4 months ago Sacro-iliac pain   [place], Laura H, MD   10 months ago Annual physical exam   [facility] Glean Hess, MD   1 year ago Diverticulitis of large intestine without perforation or abscess with bleeding   [facility] Glean Hess, MD   1 year ago Essential hypertension   [place], Laura H,  MD   1 year ago Essential hypertension   [place], Laura H, MD       Future Appointments             In 2 months Army Melia Jesse Sans, MD [facility], [facility]

## 2019-08-29 ENCOUNTER — Other Ambulatory Visit: Payer: Self-pay | Admitting: Internal Medicine

## 2019-08-29 DIAGNOSIS — K219 Gastro-esophageal reflux disease without esophagitis: Secondary | ICD-10-CM

## 2019-09-01 ENCOUNTER — Ambulatory Visit (INDEPENDENT_AMBULATORY_CARE_PROVIDER_SITE_OTHER): Payer: 59 | Admitting: Internal Medicine

## 2019-09-01 ENCOUNTER — Encounter: Payer: Self-pay | Admitting: Internal Medicine

## 2019-09-01 ENCOUNTER — Other Ambulatory Visit: Payer: Self-pay

## 2019-09-01 VITALS — BP 138/84 | HR 69 | Temp 98.4°F | Ht 63.0 in | Wt 167.0 lb

## 2019-09-01 DIAGNOSIS — Z0000 Encounter for general adult medical examination without abnormal findings: Secondary | ICD-10-CM | POA: Diagnosis not present

## 2019-09-01 DIAGNOSIS — K219 Gastro-esophageal reflux disease without esophagitis: Secondary | ICD-10-CM

## 2019-09-01 DIAGNOSIS — Z23 Encounter for immunization: Secondary | ICD-10-CM

## 2019-09-01 DIAGNOSIS — I10 Essential (primary) hypertension: Secondary | ICD-10-CM

## 2019-09-01 DIAGNOSIS — E034 Atrophy of thyroid (acquired): Secondary | ICD-10-CM

## 2019-09-01 LAB — POCT URINALYSIS DIPSTICK
Bilirubin, UA: NEGATIVE
Blood, UA: NEGATIVE
Glucose, UA: NEGATIVE
Ketones, UA: NEGATIVE
Leukocytes, UA: NEGATIVE
Nitrite, UA: NEGATIVE
Protein, UA: NEGATIVE
Spec Grav, UA: 1.01 (ref 1.010–1.025)
Urobilinogen, UA: 0.2 E.U./dL
pH, UA: 6 (ref 5.0–8.0)

## 2019-09-01 NOTE — Progress Notes (Signed)
Date:  09/01/2019   Name:  Julie Castillo   DOB:  May 29, 1955   MRN:  [id number]   Chief Complaint: Annual Exam (breast exam no pap/pneu13)  Julie Castillo is a 64 y.o. female who presents today for her Complete Annual Exam. She feels well. She reports exercising walk 2-3 times a week. She reports she is sleeping fairly well. Breast complaints none.  Mammogram: 01/2019 Pap smear: discontinued DEXA 2013 - normal Colonoscopy:  10/2017  Immunization History  Administered Date(s) Administered  . Influenza Split 11/22/2013, 12/03/2014  . Influenza-Unspecified 12/21/2018  . MMR 07/08/2017  . PFIZER SARS-COV-2 Vaccination 02/20/2019, 03/13/2019  . Tdap 07/01/2017  . Zoster 02/23/2013    Hypertension This is a chronic problem. The problem is controlled. Pertinent negatives include no chest pain, headaches, palpitations or shortness of breath. Past treatments include ACE inhibitors. The current treatment provides significant improvement. Identifiable causes of hypertension include a thyroid problem.  Gastroesophageal Reflux She complains of heartburn. She reports no abdominal pain, no chest pain, no coughing or no wheezing. This is a recurrent problem. The problem occurs rarely. Pertinent negatives include no fatigue. She has tried a PPI for the symptoms. The treatment provided significant relief.  Thyroid Problem Presents for follow-up visit. Patient reports no anxiety, constipation, diarrhea, fatigue, palpitations or tremors. The symptoms have been stable.    Lab Results  Component Value Date   CREATININE 0.76 08/19/2018   BUN 17 08/19/2018   NA 134 08/19/2018   K 4.4 08/19/2018   CL 93 (L) 08/19/2018   CO2 26 08/19/2018   Lab Results  Component Value Date   CHOL 191 08/19/2018   HDL 103 08/19/2018   LDLCALC 80 08/19/2018   TRIG 39 08/19/2018   CHOLHDL 1.9 08/19/2018   Lab Results  Component Value Date   TSH 2.240 08/19/2018   No results found for: HGBA1C Lab Results   Component Value Date   WBC 5.8 08/19/2018   HGB 13.5 08/19/2018   HCT 41.0 08/19/2018   MCV 84 08/19/2018   PLT 263 08/19/2018   Lab Results  Component Value Date   ALT 11 08/19/2018   AST 12 08/19/2018   ALKPHOS 84 08/19/2018   BILITOT 0.4 08/19/2018     Review of Systems  Constitutional: Negative for chills, fatigue and fever.  HENT: Negative for congestion, hearing loss, tinnitus, trouble swallowing and voice change.   Eyes: Negative for visual disturbance.  Respiratory: Negative for cough, chest tightness, shortness of breath and wheezing.   Cardiovascular: Positive for leg swelling (when she stands at work every day). Negative for chest pain and palpitations.  Gastrointestinal: Positive for heartburn. Negative for abdominal pain, constipation, diarrhea and vomiting.  Endocrine: Negative for polydipsia and polyuria.  Genitourinary: Negative for dysuria, frequency, genital sores, vaginal bleeding and vaginal discharge.  Musculoskeletal: Negative for arthralgias, gait problem and joint swelling.  Skin: Negative for color change and rash.  Neurological: Negative for dizziness, tremors, light-headedness and headaches.  Hematological: Negative for adenopathy. Does not bruise/bleed easily.  Psychiatric/Behavioral: Negative for dysphoric mood and sleep disturbance. The patient is not nervous/anxious.     Patient Active Problem List   Diagnosis Date Noted  . Shoulder tendonitis, left 01/18/2018  . Colon cancer screening   . Benign neoplasm of ascending colon   . Snoring 07/14/2017  . Vitreous floaters of right eye 01/29/2017  . 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 Mixed hyperlipidemia 10/02/2016  . Lower extremity edema 03/27/2016  . Vitamin  D deficiency 03/27/2016  . Seasonal allergies 03/27/2016  . Ptosis of eyelid, left 03/27/2016  . Essential hypertension 03/22/2015  . Gastroesophageal reflux disease without esophagitis 03/22/2015  . Hypothyroidism due to acquired atrophy of  thyroid 03/22/2015  . 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 Sulfa Antibiotics Hives  . Tape Other (See Comments) and Rash    Sensitive skin- can use paper tape bandaids- make skin red  Sensitive skin- can use paper tape    Past Surgical History:  Procedure Laterality Date  . BREAST CYST ASPIRATION Left 1988   "it just a fineneedle aspiration"   . COLONOSCOPY N/A 07/10/2017   Procedure: COLONOSCOPY;  Surgeon: Toledo, Benay Pike, MD;  Location: [facility];  Service: Gastroenterology;  Laterality: N/A;  . COLONOSCOPY WITH PROPOFOL N/A 11/11/2017   Procedure: COLONOSCOPY WITH PROPOFOL WITH BIOPSIES;  Surgeon: Lucilla Lame, MD;  Location: [place];  Service: Endoscopy;  Laterality: N/A;  . ESOPHAGOGASTRODUODENOSCOPY N/A 07/10/2017   Procedure: ESOPHAGOGASTRODUODENOSCOPY (EGD);  Surgeon: Toledo, Benay Pike, MD;  Location: [facility];  Service: Gastroenterology;  Laterality: N/A;  . EYE SURGERY  2001,2005   left eye surgery x 2 ( eyelid)   . POLYPECTOMY N/A 11/11/2017   Procedure: POLYPECTOMY;  Surgeon: Lucilla Lame, MD;  Location: [place];  Service: Endoscopy;  Laterality: N/A;  . 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 TOTAL KNEE ARTHROPLASTY Left 10/19/2016   Procedure: LEFT TOTAL KNEE ARTHROPLASTY;  Surgeon: Paralee Cancel, MD;  Location[facility];  Service: Orthopedics;  Laterality: Left;  Marland Kitchen VARICOSE VEIN SURGERY  2007    Social History   Tobacco Use  . Smoking status: Never Smoker  . Smokeless tobacco: Never Used  Substance Use Topics  . Alcohol use: Yes    Comment: rarely  . Drug use: No     Medication list has been reviewed and updated.  Current Meds  Medication Sig  . acetaminophen (TYLENOL) 325 MG tablet Take 2 tablets (650 mg total) by mouth every 6 (six) hours as needed for mild  pain (or Fever >/= 101).  Marland Kitchen amLODipine (NORVASC) 5 MG tablet TAKE 1 TABLET BY MOUTH DAILY.  Marland Kitchen Cholecalciferol (VITAMIN D) 2000 units CAPS Take 2,000 Units by mouth daily.  . Cyanocobalamin (VITAMIN B 12 PO) Take 1 tablet by mouth daily.  Marland Kitchen docusate sodium (COLACE) 100 MG capsule Take 1 capsule (100 mg total) by mouth 2 (two) times daily. (Patient taking differently: Take 100 mg by mouth daily. )  . enalapril (VASOTEC) 20 MG tablet TAKE 1 TABLET BY MOUTH TWICE DAILY  . fluticasone (FLONASE) 50 MCG/ACT nasal spray PLACE 2 SPRAYS DAILY INTO BOTH NOSTRILS.  . hydrochlorothiazide (HYDRODIURIL) 25 MG tablet TAKE 1 TABLET BY MOUTH ONCE DAILY  . levothyroxine (SYNTHROID) 25 MCG tablet TAKE 1 TABLET BY MOUTH DAILY BEFORE BREAKFAST.  Marland Kitchen omeprazole (PRILOSEC) 20 MG capsule TAKE 1 CAPSULE BY MOUTH TWICE DAILY  . Pyridoxine HCl (VITAMIN B6 PO) Take 1 tablet by mouth daily.    PHQ 2/9 Scores 09/01/2019 02/08/2019 08/19/2018 08/06/2017  PHQ - 2 Score 0 0 0 0  PHQ- 9 Score 2 - 5 -    GAD 7 : Generalized Anxiety Score 09/01/2019  Nervous, Anxious, on Edge 0  Control/stop worrying 0  Worry too much - different things 0  Trouble relaxing 0  Restless 0  Easily annoyed or irritable 0  Afraid - awful might happen 0  Total GAD 7 Score 0  Anxiety Difficulty Not difficult at all    BP Readings from Last 3 Encounters:  09/01/19 138/84  02/08/19 134/84  08/19/18 124/80    Physical Exam Vitals and nursing note reviewed.  Constitutional:      General: She is not in acute distress.    Appearance: She is well-developed.  HENT:     Head: Normocephalic and atraumatic.     Right Ear: Tympanic membrane and ear canal normal.     Left Ear: Tympanic membrane and ear canal normal.     Nose:     Right Sinus: No maxillary sinus tenderness.     Left Sinus: No maxillary sinus tenderness.  Eyes:     General: No scleral icterus.       Right eye: No discharge.        Left eye: No discharge.     Conjunctiva/sclera:  Conjunctivae normal.  Neck:     Thyroid: No thyromegaly.     Vascular: No carotid bruit.  Cardiovascular:     Rate and Rhythm: Normal rate and regular rhythm.     Pulses: Normal pulses.     Heart sounds: Normal heart sounds.  Pulmonary:     Effort: Pulmonary effort is normal. No respiratory distress.     Breath sounds: No wheezing.  Chest:     Breasts:        Right: No mass, nipple discharge, skin change or tenderness.        Left: No mass, nipple discharge, skin change or tenderness.  Abdominal:     General: Bowel sounds are normal.     Palpations: Abdomen is soft.     Tenderness: There is no abdominal tenderness.  Musculoskeletal:        General: Normal range of motion.     Cervical back: Normal range of motion. No erythema.     Right lower leg: No edema.     Left lower leg: No edema.  Lymphadenopathy:     Cervical: No cervical adenopathy.  Skin:    General: Skin is warm and dry.     Capillary Refill: Capillary refill takes less than 2 seconds.     Findings: No rash.     Comments: Dry patch on dorsum of right wrist  Neurological:     General: No focal deficit present.     Mental Status: She is alert and oriented to person, place, and time.     Cranial Nerves: No cranial nerve deficit.     Sensory: No sensory deficit.     Deep Tendon Reflexes: Reflexes are normal and symmetric.  Psychiatric:        Attention and Perception: Attention normal.        Mood and Affect: Mood normal.     Wt Readings from Last 3 Encounters:  09/01/19 167 lb (75.8 kg)  02/08/19 168 lb (76.2 kg)  08/19/18 161 lb (73 kg)    BP 138/84   Pulse 69   Temp 98.4 F (36.9 C) (Oral)   Ht 5' 3"  (1.6 m)   Wt 167 lb (75.8 kg)   SpO2 98%   BMI 29.58 kg/m   Assessment and Plan: 1. Annual physical exam Normal exam except for weight Continue healthy diet, exercise as able She has phentermine Rx from bariatrics but is not taking it regularly - Lipid panel - POCT urinalysis dipstick  2.  Essential hypertension Clinically stable exam with well controlled BP. Tolerating medications without side effects at this time. Pt to continue current regimen and low sodium diet; benefits of regular exercise as able discussed. - Comprehensive metabolic panel  3. Hypothyroidism due to acquired atrophy of thyroid supplemented - TSH + free T4  4. Gastroesophageal reflux disease without esophagitis Symptoms well controlled on daily PPI No red flag signs such as weight loss, n/v, melena Will continue omeprazole. - CBC with Differential/Platelet  5. Need for vaccination for pneumococcus - Pneumococcal conjugate vaccine 13-valent IM  Rec use of hydrocortisone cream to dry skin on wrist - suspected eczema.  Partially dictated using Editor, commissioning. Any errors are unintentional.  Halina Maidens, MD [facility]  09/01/2019

## 2019-09-02 LAB — LIPID PANEL
Chol/HDL Ratio: 1.9 ratio (ref 0.0–4.4)
Cholesterol, Total: 225 mg/dL — ABNORMAL HIGH (ref 100–199)
HDL: 118 mg/dL (ref >39)
LDL Chol Calc (NIH): 98 mg/dL (ref 0–99)
Triglycerides: 49 mg/dL (ref 0–149)
VLDL Cholesterol Cal: 9 mg/dL (ref 5–40)

## 2019-09-02 LAB — CBC WITH DIFFERENTIAL/PLATELET
Basophils Absolute: 0.1 10*3/uL (ref 0.0–0.2)
Basos: 2 %
EOS (ABSOLUTE): 0.1 10*3/uL (ref 0.0–0.4)
Eos: 1 %
Hematocrit: 43.1 % (ref 34.0–46.6)
Hemoglobin: 14.4 g/dL (ref 11.1–15.9)
Immature Grans (Abs): 0 10*3/uL (ref 0.0–0.1)
Immature Granulocytes: 0 %
Lymphocytes Absolute: 1.7 10*3/uL (ref 0.7–3.1)
Lymphs: 32 %
MCH: 29.4 pg (ref 26.6–33.0)
MCHC: 33.4 g/dL (ref 31.5–35.7)
MCV: 88 fL (ref 79–97)
Monocytes Absolute: 0.5 10*3/uL (ref 0.1–0.9)
Monocytes: 9 %
Neutrophils Absolute: 3.1 10*3/uL (ref 1.4–7.0)
Neutrophils: 56 %
Platelets: 254 10*3/uL (ref 150–450)
RBC: 4.89 x10E6/uL (ref 3.77–5.28)
RDW: 12.6 % (ref 11.7–15.4)
WBC: 5.5 10*3/uL (ref 3.4–10.8)

## 2019-09-02 LAB — COMPREHENSIVE METABOLIC PANEL
ALT: 8 IU/L (ref 0–32)
AST: 14 IU/L (ref 0–40)
Albumin/Globulin Ratio: 2 (ref 1.2–2.2)
Albumin: 4.7 g/dL (ref 3.8–4.8)
Alkaline Phosphatase: 82 IU/L (ref 48–121)
BUN/Creatinine Ratio: 31 — ABNORMAL HIGH (ref 12–28)
BUN: 25 mg/dL (ref 8–27)
Bilirubin Total: 0.4 mg/dL (ref 0.0–1.2)
CO2: 28 mmol/L (ref 20–29)
Calcium: 9.6 mg/dL (ref 8.7–10.3)
Chloride: 95 mmol/L — ABNORMAL LOW (ref 96–106)
Creatinine, Ser: 0.8 mg/dL (ref 0.57–1.00)
GFR calc Af Amer: 89 mL/min/{1.73_m2} (ref >59)
GFR calc non Af Amer: 78 mL/min/{1.73_m2} (ref >59)
Globulin, Total: 2.4 g/dL (ref 1.5–4.5)
Glucose: 84 mg/dL (ref 65–99)
Potassium: 3.7 mmol/L (ref 3.5–5.2)
Sodium: 137 mmol/L (ref 134–144)
Total Protein: 7.1 g/dL (ref 6.0–8.5)

## 2019-09-02 LAB — TSH+FREE T4
Free T4: 1.29 ng/dL (ref 0.82–1.77)
TSH: 1.94 u[IU]/mL (ref 0.450–4.500)

## 2019-09-04 ENCOUNTER — Encounter: Payer: Self-pay | Admitting: Internal Medicine

## 2019-09-15 DIAGNOSIS — H2511 Age-related nuclear cataract, right eye: Secondary | ICD-10-CM | POA: Diagnosis not present

## 2019-09-28 ENCOUNTER — Other Ambulatory Visit: Payer: Self-pay | Admitting: Internal Medicine

## 2019-09-28 DIAGNOSIS — H6123 Impacted cerumen, bilateral: Secondary | ICD-10-CM | POA: Diagnosis not present

## 2019-09-28 DIAGNOSIS — H903 Sensorineural hearing loss, bilateral: Secondary | ICD-10-CM | POA: Diagnosis not present

## 2019-09-28 DIAGNOSIS — H90A22 Sensorineural hearing loss, unilateral, left ear, with restricted hearing on the contralateral side: Secondary | ICD-10-CM | POA: Diagnosis not present

## 2019-10-18 ENCOUNTER — Other Ambulatory Visit: Payer: Self-pay | Admitting: Internal Medicine

## 2019-10-18 NOTE — Telephone Encounter (Signed)
Requested medication (s) are due for refill today: Yes  Requested medication (s) are on the active medication list: Yes  Last refill:  08/11/18  Future visit scheduled: Yes  Notes to clinic:  Prescription has expired.    Requested Prescriptions  Pending Prescriptions Disp Refills   levothyroxine (SYNTHROID) 25 MCG tablet [Pharmacy Med Name: LEVOTHYROXINE 25 MCG TABLET 25 Tablet] 90 tablet 3    Sig: TAKE 1 TABLET BY MOUTH DAILY BEFORE BREAKFAST.      Endocrinology:  Hypothyroid Agents Failed - 10/18/2019  7:57 AM      Failed - TSH needs to be rechecked within 3 months after an abnormal result. Refill until TSH is due.      Passed - TSH in normal range and within 360 days    TSH  Date Value Ref Range Status  09/01/2019 1.940 0.450 - 4.500 uIU/mL Final          Passed - Valid encounter within last 12 months    Recent Outpatient Visits           1 month ago Annual physical exam   [facility] Glean Hess, MD   8 months ago Sacro-iliac pain   [facility] Glean Hess, MD   1 year ago Annual physical exam   [facility] Glean Hess, MD   1 year ago Diverticulitis of large intestine without perforation or abscess with bleeding   [facility] Glean Hess, MD   1 year ago Essential hypertension   [facility] Glean Hess, MD       Future Appointments             In 4 months Army Melia Jesse Sans, MD [facility], [place]   In 10 months Army Melia Jesse Sans, MD [facility], [facility]

## 2019-12-26 ENCOUNTER — Other Ambulatory Visit: Payer: Self-pay | Admitting: Internal Medicine

## 2019-12-26 DIAGNOSIS — I10 Essential (primary) hypertension: Secondary | ICD-10-CM

## 2019-12-26 NOTE — Telephone Encounter (Signed)
Requested Prescriptions  Pending Prescriptions Disp Refills   amLODipine (NORVASC) 5 MG tablet [Pharmacy Med Name: AMLODIPINE BESYLATE 5 MG TA 5 Tablet] 90 tablet 0    Sig: TAKE 1 TABLET BY MOUTH DAILY.     Cardiovascular:  Calcium Channel Blockers Passed - 12/26/2019  7:55 AM      Passed - Last BP in normal range    BP Readings from Last 1 Encounters:  09/01/19 138/84         Passed - Valid encounter within last 6 months    Recent Outpatient Visits          3 months ago Annual physical exam   [facility] Glean Hess, MD   10 months ago Sacro-iliac pain   [facility] Glean Hess, MD   1 year ago Annual physical exam   [facility] Glean Hess, MD   1 year ago Diverticulitis of large intestine without perforation or abscess with bleeding   [facility] Glean Hess, MD   1 year ago Essential hypertension   [facility] Glean Hess, MD      Future Appointments            In 2 months Army Melia Jesse Sans, MD [facility], [place]   In 8 months Army Melia Jesse Sans, MD [facility], [facility]

## 2020-01-04 ENCOUNTER — Other Ambulatory Visit: Payer: Self-pay | Admitting: Internal Medicine

## 2020-01-04 DIAGNOSIS — Z1231 Encounter for screening mammogram for malignant neoplasm of breast: Secondary | ICD-10-CM

## 2020-01-29 ENCOUNTER — Other Ambulatory Visit: Payer: Self-pay

## 2020-01-29 ENCOUNTER — Ambulatory Visit
Admission: RE | Admit: 2020-01-29 | Discharge: 2020-01-29 | Disposition: A | Discharge status: Home / Self Care | Payer: 59 | Source: Ambulatory Visit | Attending: Internal Medicine | Admitting: Internal Medicine

## 2020-01-29 DIAGNOSIS — Z1231 Encounter for screening mammogram for malignant neoplasm of breast: Secondary | ICD-10-CM | POA: Insufficient documentation

## 2020-02-05 ENCOUNTER — Ambulatory Visit: Payer: 59

## 2020-02-15 ENCOUNTER — Other Ambulatory Visit: Payer: Self-pay | Admitting: Internal Medicine

## 2020-02-15 DIAGNOSIS — K219 Gastro-esophageal reflux disease without esophagitis: Secondary | ICD-10-CM

## 2020-03-05 ENCOUNTER — Other Ambulatory Visit: Payer: Self-pay | Admitting: Internal Medicine

## 2020-03-05 ENCOUNTER — Other Ambulatory Visit: Payer: Self-pay

## 2020-03-05 ENCOUNTER — Ambulatory Visit: Payer: 59 | Admitting: Internal Medicine

## 2020-03-05 ENCOUNTER — Encounter: Payer: Self-pay | Admitting: Internal Medicine

## 2020-03-05 VITALS — BP 128/70 | HR 86 | Ht 63.0 in | Wt 175.0 lb

## 2020-03-05 DIAGNOSIS — I10 Essential (primary) hypertension: Secondary | ICD-10-CM | POA: Diagnosis not present

## 2020-03-05 DIAGNOSIS — R0781 Pleurodynia: Secondary | ICD-10-CM | POA: Diagnosis not present

## 2020-03-05 DIAGNOSIS — E034 Atrophy of thyroid (acquired): Secondary | ICD-10-CM

## 2020-03-05 MED ORDER — LEVOTHYROXINE SODIUM 25 MCG PO TABS: 25.0000 ug | ORAL_TABLET | Freq: Every day | ORAL | 1 refills | Status: DC

## 2020-03-05 MED ORDER — AMLODIPINE BESYLATE 5 MG PO TABS: 5.0000 mg | ORAL_TABLET | Freq: Every day | ORAL | 3 refills | Status: DC

## 2020-03-05 MED ORDER — ENALAPRIL MALEATE 20 MG PO TABS: 20.0000 mg | ORAL_TABLET | Freq: Two times a day (BID) | ORAL | 3 refills | Status: DC

## 2020-03-05 MED ORDER — HYDROCHLOROTHIAZIDE 25 MG PO TABS: 25.0000 mg | ORAL_TABLET | Freq: Every day | ORAL | 3 refills | Status: DC

## 2020-03-05 NOTE — Progress Notes (Signed)
Date:  03/05/2020   Name:  Julie Castillo   DOB:  12-22-1955   MRN:  [id number]   Chief Complaint: Hypertension (Follow up )  Hypertension This is a chronic problem. The problem is controlled. Pertinent negatives include no chest pain, headaches, palpitations or shortness of breath. Past treatments include calcium channel blockers, diuretics and ACE inhibitors. The current treatment provides significant improvement. There are no compliance problems.     Lab Results  Component Value Date   CREATININE 0.80 09/01/2019   BUN 25 09/01/2019   NA 137 09/01/2019   K 3.7 09/01/2019   CL 95 (L) 09/01/2019   CO2 28 09/01/2019   Lab Results  Component Value Date   CHOL 225 (H) 09/01/2019   HDL 118 09/01/2019   LDLCALC 98 09/01/2019   TRIG 49 09/01/2019   CHOLHDL 1.9 09/01/2019   Lab Results  Component Value Date   TSH 1.940 09/01/2019   No results found for: HGBA1C Lab Results  Component Value Date   WBC 5.5 09/01/2019   HGB 14.4 09/01/2019   HCT 43.1 09/01/2019   MCV 88 09/01/2019   PLT 254 09/01/2019   Lab Results  Component Value Date   ALT 8 09/01/2019   AST 14 09/01/2019   ALKPHOS 82 09/01/2019   BILITOT 0.4 09/01/2019     Review of Systems  Constitutional: Negative for appetite change, fatigue, fever and unexpected weight change.  HENT: Negative for tinnitus and trouble swallowing.   Eyes: Negative for visual disturbance.  Respiratory: Negative for cough, chest tightness and shortness of breath.   Cardiovascular: Negative for chest pain, palpitations and leg swelling.  Gastrointestinal: Negative for abdominal pain, constipation and diarrhea.  Genitourinary: Negative for dysuria.  Musculoskeletal: Positive for arthralgias (left ring finger tender since fall in december) and myalgias (left sided rib discomfort since fall).  Neurological: Negative for tremors, numbness and headaches.  Psychiatric/Behavioral: Negative for dysphoric mood and sleep disturbance. The  patient is not nervous/anxious.     Patient Active Problem List   Diagnosis Date Noted  . Shoulder tendonitis, left 01/18/2018  . Colon cancer screening   . Benign neoplasm of ascending colon   . Snoring 07/14/2017  . Vitreous floaters of right eye 01/29/2017  . 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 Mixed hyperlipidemia 10/02/2016  . Lower extremity edema 03/27/2016  . Vitamin D deficiency 03/27/2016  . Seasonal allergies 03/27/2016  . Ptosis of eyelid, left 03/27/2016  . Essential hypertension 03/22/2015  . Gastroesophageal reflux disease without esophagitis 03/22/2015  . Hypothyroidism due to acquired atrophy of thyroid 03/22/2015  . 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 Sulfa Antibiotics Hives  . Tape Other (See Comments) and Rash    Sensitive skin- can use paper tape bandaids- make skin red  Sensitive skin- can use paper tape    Past Surgical History:  Procedure Laterality Date  . BREAST CYST ASPIRATION Left 1988   "it just a fineneedle aspiration"   . COLONOSCOPY N/A 07/10/2017   Procedure: COLONOSCOPY;  Surgeon: Toledo, Benay Pike, MD;  Location: [facility];  Service: Gastroenterology;  Laterality: N/A;  . COLONOSCOPY WITH PROPOFOL N/A 11/11/2017   Procedure: COLONOSCOPY WITH PROPOFOL WITH BIOPSIES;  Surgeon: Lucilla Lame, MD;  Location: [place];  Service: Endoscopy;  Laterality: N/A;  . ESOPHAGOGASTRODUODENOSCOPY N/A 07/10/2017   Procedure: ESOPHAGOGASTRODUODENOSCOPY (EGD);  Surgeon: Toledo, Benay Pike, MD;  Location: [facility];  Service: Gastroenterology;  Laterality: N/A;  .  EYE SURGERY  2001,2005   left eye surgery x 2 ( eyelid)   . POLYPECTOMY N/A 11/11/2017   Procedure: POLYPECTOMY;  Surgeon: Lucilla Lame, MD;  Location: [place];  Service: Endoscopy;  Laterality: N/A;  . TOTAL KNEE ARTHROPLASTY Right 03/20/2014   Procedure: RIGHT TOTAL KNEE ARTHROPLASTY;  Surgeon: Mauri Pole, MD;  Location: [facility];  Service:  Orthopedics;  Laterality: Right;  . TOTAL KNEE ARTHROPLASTY Left 10/19/2016   Procedure: LEFT TOTAL KNEE ARTHROPLASTY;  Surgeon: Paralee Cancel, MD;  Location: [facility];  Service: Orthopedics;  Laterality: Left;  Marland Kitchen VARICOSE VEIN SURGERY  2007    Social History   Tobacco Use  . Smoking status: Never Smoker  . Smokeless tobacco: Never Used  Substance Use Topics  . Alcohol use: Yes    Comment: rarely  . Drug use: No     Medication list has been reviewed and updated.  Current Meds  Medication Sig  . amLODipine (NORVASC) 5 MG tablet TAKE 1 TABLET BY MOUTH DAILY.  Marland Kitchen Cholecalciferol (VITAMIN D) 2000 units CAPS Take 2,000 Units by mouth daily.  . Cyanocobalamin (VITAMIN B 12 PO) Take 1 tablet by mouth daily.  Marland Kitchen docusate sodium (COLACE) 100 MG capsule Take 1 capsule (100 mg total) by mouth 2 (two) times daily. (Patient taking differently: Take 100 mg by mouth daily.)  . enalapril (VASOTEC) 20 MG tablet TAKE 1 TABLET BY MOUTH TWICE DAILY  . fluticasone (FLONASE) 50 MCG/ACT nasal spray PLACE 2 SPRAYS DAILY INTO BOTH NOSTRILS.  . hydrochlorothiazide (HYDRODIURIL) 25 MG tablet TAKE 1 TABLET BY MOUTH ONCE DAILY  . levothyroxine (SYNTHROID) 25 MCG tablet TAKE 1 TABLET BY MOUTH DAILY BEFORE BREAKFAST.  Marland Kitchen omeprazole (PRILOSEC) 20 MG capsule TAKE 1 CAPSULE BY MOUTH TWICE DAILY  . phentermine (ADIPEX-P) 37.5 MG tablet Take 37.5 mg by mouth every morning. PRN  . Pyridoxine HCl (VITAMIN B6 PO) Take 1 tablet by mouth daily.    PHQ 2/9 Scores 03/05/2020 09/01/2019 02/08/2019 08/19/2018  PHQ - 2 Score 0 0 0 0  PHQ- 9 Score 0 2 - 5    GAD 7 : Generalized Anxiety Score 03/05/2020 09/01/2019  Nervous, Anxious, on Edge 0 0  Control/stop worrying 0 0  Worry too much - different things 0 0  Trouble relaxing 0 0  Restless 0 0  Easily annoyed or irritable 0 0  Afraid - awful might happen 0 0  Total GAD 7 Score 0 0  Anxiety Difficulty Not difficult at all Not difficult at all    BP Readings from Last 3  Encounters:  03/05/20 128/70  09/01/19 138/84  02/08/19 134/84    Physical Exam Vitals and nursing note reviewed.  Constitutional:      General: She is not in acute distress.    Appearance: She is well-developed.  HENT:     Head: Normocephalic and atraumatic.  Cardiovascular:     Rate and Rhythm: Normal rate and regular rhythm.     Pulses: Normal pulses.     Heart sounds: No murmur heard.   Pulmonary:     Effort: Pulmonary effort is normal. No respiratory distress.     Breath sounds: No wheezing or rhonchi.  Musculoskeletal:     Cervical back: Normal range of motion.     Right lower leg: No edema.     Left lower leg: No edema.     Comments: Tender over axillary ribs on left  Left 4 th finger slightly swollen but full ROM  Skin:    General: Skin is warm and dry.     Capillary Refill: Capillary refill takes less than 2 seconds.     Findings: No rash.     Comments: Resolving abrasions on both knees  Neurological:     General: No focal deficit present.     Mental Status: She is alert and oriented to person, place, and time.  Psychiatric:        Attention and Perception: Attention normal.        Mood and Affect: Mood and affect normal.     Wt Readings from Last 3 Encounters:  03/05/20 175 lb (79.4 kg)  09/01/19 167 lb (75.8 kg)  02/08/19 168 lb (76.2 kg)    BP 128/70   Pulse 86   Ht 5\' 3"  (1.6 m)   Wt 175 lb (79.4 kg)   SpO2 97%   BMI 31.00 kg/m   Assessment and Plan: 1. Essential hypertension Clinically stable exam with well controlled BP. Tolerating medications without side effects at this time. Pt to continue current regimen and low sodium diet; benefits of regular exercise as able discussed. - amLODipine (NORVASC) 5 MG tablet; Take 1 tablet (5 mg total) by mouth daily.  Dispense: 90 tablet; Refill: 3 - enalapril (VASOTEC) 20 MG tablet; Take 1 tablet (20 mg total) by mouth 2 (two) times daily.  Dispense: 180 tablet; Refill: 3 - hydrochlorothiazide  (HYDRODIURIL) 25 MG tablet; Take 1 tablet (25 mg total) by mouth daily.  Dispense: 90 tablet; Refill: 3  2. Hypothyroidism due to acquired atrophy of thyroid supplemented - levothyroxine (SYNTHROID) 25 MCG tablet; Take 1 tablet (25 mcg total) by mouth daily before breakfast.  Dispense: 90 tablet; Refill: 1  3. Rib pain on left side S/p fall - likely muscular strain from fall No indication for X-rays at this time   Partially dictated using [employer]. Any errors are unintentional.  Halina Maidens, MD [facility]  03/05/2020

## 2020-03-07 ENCOUNTER — Other Ambulatory Visit: Payer: Self-pay

## 2020-03-07 ENCOUNTER — Ambulatory Visit: Payer: 59

## 2020-03-07 ENCOUNTER — Ambulatory Visit (INDEPENDENT_AMBULATORY_CARE_PROVIDER_SITE_OTHER): Payer: 59

## 2020-03-07 DIAGNOSIS — Z23 Encounter for immunization: Secondary | ICD-10-CM

## 2020-05-13 MED FILL — LEVOTHYROXINE 25 MCG TABLET: 25 | 90 days supply | Qty: 90 | Fill #0

## 2020-05-13 MED FILL — OMEPRAZOLE 20 MG CAP: 20 | 90 days supply | Qty: 180 | Fill #0

## 2020-05-31 ENCOUNTER — Other Ambulatory Visit (HOSPITAL_COMMUNITY): Payer: Self-pay

## 2020-06-03 ENCOUNTER — Other Ambulatory Visit (HOSPITAL_COMMUNITY): Payer: Self-pay

## 2020-06-03 MED ORDER — AMOXICILLIN 500 MG PO CAPS
2000.0000 mg | ORAL_CAPSULE | ORAL | 1 refills | Status: DC
Start: 2020-06-03 — End: 2020-08-01
  Filled 2020-06-03: qty 20, 5d supply, fill #0

## 2020-06-04 ENCOUNTER — Other Ambulatory Visit (HOSPITAL_COMMUNITY): Payer: Self-pay

## 2020-06-11 ENCOUNTER — Ambulatory Visit: Payer: 59

## 2020-06-11 ENCOUNTER — Other Ambulatory Visit: Payer: Self-pay

## 2020-06-11 ENCOUNTER — Ambulatory Visit (INDEPENDENT_AMBULATORY_CARE_PROVIDER_SITE_OTHER): Payer: 59

## 2020-06-11 DIAGNOSIS — Z23 Encounter for immunization: Secondary | ICD-10-CM

## 2020-06-13 ENCOUNTER — Ambulatory Visit: Payer: 59

## 2020-06-13 ENCOUNTER — Other Ambulatory Visit (HOSPITAL_COMMUNITY): Payer: Self-pay

## 2020-06-13 MED FILL — Amlodipine Besylate Tab 5 MG (Base Equivalent): ORAL | 90 days supply | Qty: 90 | Fill #0 | Status: AC

## 2020-06-13 MED FILL — Hydrochlorothiazide Tab 25 MG: ORAL | 90 days supply | Qty: 90 | Fill #0 | Status: AC

## 2020-06-13 MED FILL — Enalapril Maleate Tab 20 MG: ORAL | 90 days supply | Qty: 180 | Fill #0 | Status: AC

## 2020-06-17 ENCOUNTER — Other Ambulatory Visit (HOSPITAL_COMMUNITY): Payer: Self-pay

## 2020-07-18 ENCOUNTER — Other Ambulatory Visit: Payer: Self-pay | Admitting: Internal Medicine

## 2020-07-18 ENCOUNTER — Other Ambulatory Visit (HOSPITAL_COMMUNITY): Payer: Self-pay

## 2020-07-18 MED ORDER — FLUTICASONE PROPIONATE 50 MCG/ACT NA SUSP
NASAL | 1 refills | Status: DC
Start: 2020-07-18 — End: 2021-03-05
  Filled 2020-07-18: qty 48, 90d supply, fill #0
  Filled 2020-12-06: qty 48, 90d supply, fill #1

## 2020-07-28 ENCOUNTER — Encounter: Payer: Self-pay | Admitting: Internal Medicine

## 2020-07-31 ENCOUNTER — Encounter: Payer: Self-pay | Admitting: Internal Medicine

## 2020-08-01 ENCOUNTER — Other Ambulatory Visit: Payer: Self-pay

## 2020-08-01 ENCOUNTER — Telehealth (INDEPENDENT_AMBULATORY_CARE_PROVIDER_SITE_OTHER): Payer: 59 | Admitting: Internal Medicine

## 2020-08-01 ENCOUNTER — Telehealth: Payer: Self-pay

## 2020-08-01 ENCOUNTER — Encounter: Payer: Self-pay | Admitting: Internal Medicine

## 2020-08-01 VITALS — Temp 97.6°F | Ht 63.0 in

## 2020-08-01 DIAGNOSIS — U071 COVID-19: Secondary | ICD-10-CM

## 2020-08-01 MED ORDER — MOLNUPIRAVIR EUA 200MG CAPSULE: 4.0000 | ORAL_CAPSULE | Freq: Two times a day (BID) | ORAL | 0 refills | Status: AC

## 2020-08-01 NOTE — Telephone Encounter (Signed)

## 2020-08-01 NOTE — Progress Notes (Signed)
Date:  08/01/2020   Name:  Julie Castillo   DOB:  [birth year]   MRN:  [id number]  This encounter was conducted via video encounter due to the need for social distancing in light of the Covid-19 pandemic.  The patient was correctly identified.  I advised that I am conducting the visit from a secure room in my office at [facility] clinic.  The patient is located at home. The limitations of this form of encounter were discussed with the patient and he/she agreed to proceed.  Some vital signs will be absent.  Due to connectivity issues the visit was converted to a telephone encounter.  Chief Complaint: Covid Positive (Tested positive 6/7 home test, snizzy, fatiuge, congestion, no fever)  Sinus Problem This is a new problem. The current episode started yesterday. The problem has been gradually worsening since onset. There has been no fever. Associated symptoms include chills, congestion, coughing, headaches and sneezing. Pertinent negatives include no shortness of breath or sore throat. Past treatments include nothing (tested positive for Covid - husband sick earlier this week).   Immunization History  Administered Date(s) Administered   Influenza Split 11/22/2013, 12/03/2014   Influenza-Unspecified 12/21/2018, 11/22/2019   MMR 07/08/2017   PFIZER(Purple Top)SARS-COV-2 Vaccination 02/20/2019, 03/13/2019, 12/15/2019, 05/31/2020   Pneumococcal Conjugate-13 09/01/2019   Tdap 07/01/2017   Zoster Recombinat (Shingrix) 03/07/2020, 06/11/2020   Zoster, Live 02/23/2013    Lab Results  Component Value Date   CREATININE 0.80 09/01/2019   BUN 25 09/01/2019   NA 137 09/01/2019   K 3.7 09/01/2019   CL 95 (L) 09/01/2019   CO2 28 09/01/2019   Lab Results  Component Value Date   CHOL 225 (H) 09/01/2019   HDL 118 09/01/2019   LDLCALC 98 09/01/2019   TRIG 49 09/01/2019   CHOLHDL 1.9 09/01/2019   Lab Results  Component Value Date   TSH 1.940 09/01/2019   No results found for: HGBA1C Lab  Results  Component Value Date   WBC 5.5 09/01/2019   HGB 14.4 09/01/2019   HCT 43.1 09/01/2019   MCV 88 09/01/2019   PLT 254 09/01/2019   Lab Results  Component Value Date   ALT 8 09/01/2019   AST 14 09/01/2019   ALKPHOS 82 09/01/2019   BILITOT 0.4 09/01/2019     Review of Systems  Constitutional:  Positive for chills and fatigue. Negative for fever.  HENT:  Positive for congestion and sneezing. Negative for sore throat.   Respiratory:  Positive for cough. Negative for shortness of breath and wheezing.   Cardiovascular:  Negative for chest pain.  Gastrointestinal:  Negative for diarrhea, nausea and vomiting.  Neurological:  Positive for headaches. Negative for dizziness.   Patient Active Problem List   Diagnosis Date Noted   Shoulder tendonitis, left 01/18/2018   Colon cancer screening    Benign neoplasm of ascending colon    Snoring 07/14/2017   Vitreous floaters of right eye 01/29/2017   S/P left TKA 10/19/2016   Mixed hyperlipidemia 10/02/2016   Lower extremity edema 03/27/2016   Vitamin D deficiency 03/27/2016   Seasonal allergies 03/27/2016   Ptosis of eyelid, left 03/27/2016   Essential hypertension 03/22/2015   Gastroesophageal reflux disease without esophagitis 03/22/2015   Hypothyroidism due to acquired atrophy of thyroid 03/22/2015   S/P right TKA 03/20/2014    Allergies  Allergen Reactions   Sulfa Antibiotics Hives   Tape Other (See Comments) and Rash    Sensitive skin- can use paper tape  bandaids- make skin red  Sensitive skin- can use paper tape    Past Surgical History:  Procedure Laterality Date   BREAST CYST ASPIRATION Left 1988   "it just a fineneedle aspiration"    COLONOSCOPY N/A 07/10/2017   Procedure: COLONOSCOPY;  Surgeon: Toledo, Benay Pike, MD;  Location: [facility];  Service: Gastroenterology;  Laterality: N/A;   COLONOSCOPY WITH PROPOFOL N/A 11/11/2017   Procedure: COLONOSCOPY WITH PROPOFOL WITH BIOPSIES;  Surgeon: Lucilla Lame,  MD;  Location: [place];  Service: Endoscopy;  Laterality: N/A;   ESOPHAGOGASTRODUODENOSCOPY N/A 07/10/2017   Procedure: ESOPHAGOGASTRODUODENOSCOPY (EGD);  Surgeon: Toledo, Benay Pike, MD;  Location: [facility];  Service: Gastroenterology;  Laterality: N/A;   EYE SURGERY  2001,2005   left eye surgery x 2 ( eyelid)    POLYPECTOMY N/A 11/11/2017   Procedure: POLYPECTOMY;  Surgeon: Lucilla Lame, MD;  Location: [place];  Service: Endoscopy;  Laterality: N/A;   TOTAL KNEE ARTHROPLASTY Right 03/20/2014   Procedure: RIGHT TOTAL KNEE ARTHROPLASTY;  Surgeon: Mauri Pole, MD;  Location: [facility];  Service: Orthopedics;  Laterality: Right;   TOTAL KNEE ARTHROPLASTY Left 10/19/2016   Procedure: LEFT TOTAL KNEE ARTHROPLASTY;  Surgeon: Paralee Cancel, MD;  [facility];  Service: Orthopedics;  Laterality: Left;   VARICOSE VEIN SURGERY  2007    Social History   Tobacco Use   Smoking status: Never   Smokeless tobacco: Never  Substance Use Topics   Alcohol use: Yes    Comment: rarely   Drug use: No     Medication list has been reviewed and updated.  Current Meds  Medication Sig   amLODipine (NORVASC) 5 MG tablet TAKE 1 TABLET (5 MG TOTAL) BY MOUTH DAILY.   Cholecalciferol (VITAMIN D) 2000 units CAPS Take 2,000 Units by mouth daily.   Cyanocobalamin (VITAMIN B 12 PO) Take 1 tablet by mouth daily.   docusate sodium (COLACE) 100 MG capsule Take 1 capsule (100 mg total) by mouth 2 (two) times daily. (Patient taking differently: Take 100 mg by mouth daily.)   enalapril (VASOTEC) 20 MG tablet TAKE 1 TABLET (20 MG TOTAL) BY MOUTH 2 (TWO) TIMES DAILY.   fluticasone (FLONASE) 50 MCG/ACT nasal spray PLACE 2 SPRAYS INTO BOTH NOSTRILS ONCE A DAY.   hydrochlorothiazide (HYDRODIURIL) 25 MG tablet TAKE 1 TABLET (25 MG TOTAL) BY MOUTH DAILY.   levothyroxine (SYNTHROID) 25 MCG tablet TAKE 1 TABLET (25 MCG TOTAL) BY MOUTH DAILY BEFORE BREAKFAST.   omeprazole (PRILOSEC) 20 MG capsule TAKE  1 CAPSULE BY MOUTH TWICE DAILY   phentermine (ADIPEX-P) 37.5 MG tablet Take 37.5 mg by mouth every morning. PRN   Pyridoxine HCl (VITAMIN B6 PO) Take 1 tablet by mouth daily.    PHQ 2/9 Scores 08/01/2020 03/05/2020 09/01/2019 02/08/2019  PHQ - 2 Score 0 0 0 0  PHQ- 9 Score 3 0 2 -    GAD 7 : Generalized Anxiety Score 08/01/2020 03/05/2020 09/01/2019  Nervous, Anxious, on Edge 0 0 0  Control/stop worrying 0 0 0  Worry too much - different things 0 0 0  Trouble relaxing 0 0 0  Restless 0 0 0  Easily annoyed or irritable 0 0 0  Afraid - awful might happen 0 0 0  Total GAD 7 Score 0 0 0  Anxiety Difficulty - Not difficult at all Not difficult at all    BP Readings from Last 3 Encounters:  03/05/20 128/70  09/01/19 138/84  02/08/19 134/84    Physical Exam  Pulmonary:     Effort: Pulmonary effort is normal.     Comments: No cough or dyspnea appreciated during the call. Neurological:     Mental Status: She is alert.  Psychiatric:        Attention and Perception: Attention normal.        Mood and Affect: Mood normal.        Speech: Speech normal.        Cognition and Memory: Cognition normal.    Wt Readings from Last 3 Encounters:  03/05/20 175 lb (79.4 kg)  09/01/19 167 lb (75.8 kg)  02/08/19 168 lb (76.2 kg)    Temp 97.6 F (36.4 C) (Temporal)   Ht 5' 3" (1.6 m)   BMI 31.00 kg/m   Assessment and Plan: 1. COVID-19 virus infection Continue fluids and Tylenol, rest Await [facility] to advise on return. - molnupiravir EUA 200 mg CAPS; Take 4 capsules (800 mg total) by mouth 2 (two) times daily for 5 days.  Dispense: 40 capsule; Refill: 0  I spent 8 minutes dedicated to the care of this patient on the date of this encounter to include pre-visit review of chart, face to face time with the patient and post visit ordering of testing.  Partially dictated using [employer]. Any errors are unintentional.  Halina Maidens, MD [facility]  08/01/2020

## 2020-08-14 ENCOUNTER — Other Ambulatory Visit (HOSPITAL_BASED_OUTPATIENT_CLINIC_OR_DEPARTMENT_OTHER): Payer: Self-pay

## 2020-08-14 ENCOUNTER — Other Ambulatory Visit (HOSPITAL_COMMUNITY): Payer: Self-pay

## 2020-08-14 ENCOUNTER — Other Ambulatory Visit: Payer: Self-pay | Admitting: Internal Medicine

## 2020-08-14 DIAGNOSIS — K219 Gastro-esophageal reflux disease without esophagitis: Secondary | ICD-10-CM

## 2020-08-14 MED ORDER — OMEPRAZOLE 20 MG PO CPDR
1.0000 | DELAYED_RELEASE_CAPSULE | Freq: Two times a day (BID) | ORAL | 0 refills | Status: DC
  Filled 2020-08-14: qty 180, 90d supply, fill #0

## 2020-08-14 MED FILL — Levothyroxine Sodium Tab 25 MCG: ORAL | 90 days supply | Qty: 90 | Fill #0 | Status: AC

## 2020-08-19 ENCOUNTER — Other Ambulatory Visit (HOSPITAL_COMMUNITY): Payer: Self-pay

## 2020-08-27 ENCOUNTER — Ambulatory Visit
Admission: RE | Admit: 2020-08-27 | Discharge: 2020-08-27 | Disposition: A | Discharge status: Home / Self Care | Payer: 59 | Source: Ambulatory Visit | Attending: Internal Medicine | Admitting: Internal Medicine

## 2020-08-27 ENCOUNTER — Ambulatory Visit
Admission: RE | Admit: 2020-08-27 | Discharge: 2020-08-27 | Disposition: A | Discharge status: Home / Self Care | Payer: 59 | Attending: Internal Medicine | Admitting: Internal Medicine

## 2020-08-27 ENCOUNTER — Encounter: Payer: Self-pay | Admitting: Internal Medicine

## 2020-08-27 ENCOUNTER — Other Ambulatory Visit: Payer: Self-pay

## 2020-08-27 ENCOUNTER — Ambulatory Visit: Payer: 59 | Admitting: Internal Medicine

## 2020-08-27 VITALS — BP 144/78 | HR 74 | Temp 97.7°F | Ht 63.0 in | Wt 180.0 lb

## 2020-08-27 DIAGNOSIS — M2012 Hallux valgus (acquired), left foot: Secondary | ICD-10-CM | POA: Diagnosis not present

## 2020-08-27 DIAGNOSIS — M79672 Pain in left foot: Secondary | ICD-10-CM

## 2020-08-27 DIAGNOSIS — S92352A Displaced fracture of fifth metatarsal bone, left foot, initial encounter for closed fracture: Secondary | ICD-10-CM | POA: Diagnosis not present

## 2020-08-27 DIAGNOSIS — M7732 Calcaneal spur, left foot: Secondary | ICD-10-CM | POA: Diagnosis not present

## 2020-08-27 MED ORDER — INDOMETHACIN 50 MG PO CAPS
50.0000 mg | ORAL_CAPSULE | Freq: Two times a day (BID) | ORAL | 0 refills | Status: DC
  Filled 2020-08-27: qty 30, 15d supply, fill #0

## 2020-08-27 NOTE — Progress Notes (Signed)
Date:  08/27/2020   Name:  Julie Castillo   DOB:  Jan 26, 1956   MRN:  [id number]   Chief Complaint: Foot Swelling (More than x2 weeks, left foot, painful, doesn't go down when propping foot up )  Foot Injury  The incident occurred more than 1 week ago. There was no injury mechanism. The pain is present in the left foot. The quality of the pain is described as burning and shooting. The pain is moderate. The pain has been Fluctuating since onset. Associated symptoms include an inability to bear weight. The symptoms are aggravated by weight bearing, palpation and movement. She has tried ice and elevation for the symptoms. The treatment provided no relief (ice no relief, elevation helps some).   Lab Results  Component Value Date   CREATININE 0.80 09/01/2019   BUN 25 09/01/2019   NA 137 09/01/2019   K 3.7 09/01/2019   CL 95 (L) 09/01/2019   CO2 28 09/01/2019   Lab Results  Component Value Date   CHOL 225 (H) 09/01/2019   HDL 118 09/01/2019   LDLCALC 98 09/01/2019   TRIG 49 09/01/2019   CHOLHDL 1.9 09/01/2019   Lab Results  Component Value Date   TSH 1.940 09/01/2019   No results found for: HGBA1C Lab Results  Component Value Date   WBC 5.5 09/01/2019   HGB 14.4 09/01/2019   HCT 43.1 09/01/2019   MCV 88 09/01/2019   PLT 254 09/01/2019   Lab Results  Component Value Date   ALT 8 09/01/2019   AST 14 09/01/2019   ALKPHOS 82 09/01/2019   BILITOT 0.4 09/01/2019    Review of Systems  Constitutional:  Negative for chills and fatigue.  Musculoskeletal:  Positive for arthralgias, gait problem and joint swelling.   Patient Active Problem List   Diagnosis Date Noted   Shoulder tendonitis, left 01/18/2018   Colon cancer screening    Benign neoplasm of ascending colon    Snoring 07/14/2017   Vitreous floaters of right eye 01/29/2017   S/P left TKA 10/19/2016   Mixed hyperlipidemia 10/02/2016   Lower extremity edema 03/27/2016   Vitamin D deficiency 03/27/2016   Seasonal  allergies 03/27/2016   Ptosis of eyelid, left 03/27/2016   Essential hypertension 03/22/2015   Gastroesophageal reflux disease without esophagitis 03/22/2015   Hypothyroidism due to acquired atrophy of thyroid 03/22/2015   S/P right TKA 03/20/2014    Allergies  Allergen Reactions   Sulfa Antibiotics Hives   Tape Other (See Comments) and Rash    Sensitive skin- can use paper tape bandaids- make skin red  Sensitive skin- can use paper tape    Past Surgical History:  Procedure Laterality Date   BREAST CYST ASPIRATION Left 1988   "it just a fineneedle aspiration"    COLONOSCOPY N/A 07/10/2017   Procedure: COLONOSCOPY;  Surgeon: Toledo, Benay Pike, MD;  Location: [facility];  Service: Gastroenterology;  Laterality: N/A;   COLONOSCOPY WITH PROPOFOL N/A 11/11/2017   Procedure: COLONOSCOPY WITH PROPOFOL WITH BIOPSIES;  Surgeon: Lucilla Lame, MD;  Location: [place];  Service: Endoscopy;  Laterality: N/A;   ESOPHAGOGASTRODUODENOSCOPY N/A 07/10/2017   Procedure: ESOPHAGOGASTRODUODENOSCOPY (EGD);  Surgeon: Toledo, Benay Pike, MD;  Location: [facility];  Service: Gastroenterology;  Laterality: N/A;   EYE SURGERY  2001,2005   left eye surgery x 2 ( eyelid)    POLYPECTOMY N/A 11/11/2017   Procedure: POLYPECTOMY;  Surgeon: Lucilla Lame, MD;  Location: [place];  Service: Endoscopy;  Laterality:  N/A;   TOTAL KNEE ARTHROPLASTY Right 03/20/2014   Procedure: RIGHT TOTAL KNEE ARTHROPLASTY;  Surgeon: Mauri Pole, MD;  [facility];  Service: Orthopedics;  Laterality: Right;   TOTAL KNEE ARTHROPLASTY Left 10/19/2016   Procedure: LEFT TOTAL KNEE ARTHROPLASTY;  Surgeon: Paralee Cancel, MD;  [facility];  Service: Orthopedics;  Laterality: Left;   VARICOSE VEIN SURGERY  2007    Social History   Tobacco Use   Smoking status: Never   Smokeless tobacco: Never  Substance Use Topics   Alcohol use: Yes    Comment: rarely   Drug use: No     Medication list has been  reviewed and updated.  Current Meds  Medication Sig   amLODipine (NORVASC) 5 MG tablet TAKE 1 TABLET (5 MG TOTAL) BY MOUTH DAILY.   Cholecalciferol (VITAMIN D) 2000 units CAPS Take 2,000 Units by mouth daily.   Cyanocobalamin (VITAMIN B 12 PO) Take 1 tablet by mouth daily.   docusate sodium (COLACE) 100 MG capsule Take 1 capsule (100 mg total) by mouth 2 (two) times daily. (Patient taking differently: Take 100 mg by mouth daily.)   enalapril (VASOTEC) 20 MG tablet TAKE 1 TABLET (20 MG TOTAL) BY MOUTH 2 (TWO) TIMES DAILY.   fluticasone (FLONASE) 50 MCG/ACT nasal spray PLACE 2 SPRAYS INTO BOTH NOSTRILS ONCE A DAY.   hydrochlorothiazide (HYDRODIURIL) 25 MG tablet TAKE 1 TABLET (25 MG TOTAL) BY MOUTH DAILY.   levothyroxine (SYNTHROID) 25 MCG tablet TAKE 1 TABLET (25 MCG TOTAL) BY MOUTH DAILY BEFORE BREAKFAST.   omeprazole (PRILOSEC) 20 MG capsule TAKE 1 CAPSULE BY MOUTH TWICE DAILY   phentermine (ADIPEX-P) 37.5 MG tablet Take 37.5 mg by mouth every morning. PRN    PHQ 2/9 Scores 08/27/2020 08/01/2020 03/05/2020 09/01/2019  PHQ - 2 Score 0 0 0 0  PHQ- 9 Score 1 3 0 2    GAD 7 : Generalized Anxiety Score 08/27/2020 08/01/2020 03/05/2020 09/01/2019  Nervous, Anxious, on Edge 0 0 0 0  Control/stop worrying 0 0 0 0  Worry too much - different things 0 0 0 0  Trouble relaxing 0 0 0 0  Restless 0 0 0 0  Easily annoyed or irritable 0 0 0 0  Afraid - awful might happen 0 0 0 0  Total GAD 7 Score 0 0 0 0  Anxiety Difficulty - - Not difficult at all Not difficult at all    BP Readings from Last 3 Encounters:  08/27/20 (!) 144/78  03/05/20 128/70  09/01/19 138/84    Physical Exam Vitals and nursing note reviewed.  Constitutional:      General: She is not in acute distress.    Appearance: She is well-developed.  HENT:     Head: Normocephalic and atraumatic.  Pulmonary:     Effort: Pulmonary effort is normal. No respiratory distress.  Feet:     Left foot:     Skin integrity: Erythema and warmth  present.     Comments: Mild soft tissue swelling distal and lateral left foot Skin:    General: Skin is warm and dry.     Findings: No rash.  Neurological:     Mental Status: She is alert and oriented to person, place, and time.  Psychiatric:        Mood and Affect: Mood normal.        Behavior: Behavior normal.    Wt Readings from Last 3 Encounters:  08/27/20 180 lb (81.6 kg)  03/05/20 175 lb (  79.4 kg)  09/01/19 167 lb (75.8 kg)    BP (!) 144/78   Pulse 74   Temp 97.7 F (36.5 C) (Oral)   Ht 5\' 3"  (1.6 m)   Wt 180 lb (81.6 kg)   SpO2 100%   BMI 31.89 kg/m   Assessment and Plan: 1. Left foot pain Suspect gout; could be stress fracture Begin Indomethacin bid; image and uric acid level Low purine diet will be needed if UA is elevated - DG Foot Complete Left; Future - Uric acid - CBC with Differential/Platelet - indomethacin (INDOCIN) 50 MG capsule; Take 1 capsule (50 mg total) by mouth 2 (two) times daily with a meal. Take twice a day for 5 days as needed for gout  Dispense: 30 capsule; Refill: 0   Partially dictated using [employer]. Any errors are unintentional.  Halina Maidens, MD [facility]  08/27/2020

## 2020-08-28 ENCOUNTER — Other Ambulatory Visit (HOSPITAL_COMMUNITY): Payer: Self-pay

## 2020-08-28 ENCOUNTER — Encounter: Payer: Self-pay | Admitting: Family Medicine

## 2020-08-28 ENCOUNTER — Ambulatory Visit: Payer: 59 | Admitting: Family Medicine

## 2020-08-28 VITALS — BP 144/90 | HR 78 | Temp 98.1°F | Ht 63.0 in | Wt 179.8 lb

## 2020-08-28 DIAGNOSIS — S92352A Displaced fracture of fifth metatarsal bone, left foot, initial encounter for closed fracture: Secondary | ICD-10-CM | POA: Diagnosis not present

## 2020-08-28 LAB — CBC WITH DIFFERENTIAL/PLATELET
Basophils Absolute: 0.1 10*3/uL (ref 0.0–0.2)
Basos: 1 %
EOS (ABSOLUTE): 0.1 10*3/uL (ref 0.0–0.4)
Eos: 1 %
Hematocrit: 40.2 % (ref 34.0–46.6)
Hemoglobin: 13.4 g/dL (ref 11.1–15.9)
Immature Grans (Abs): 0 10*3/uL (ref 0.0–0.1)
Immature Granulocytes: 0 %
Lymphocytes Absolute: 2 10*3/uL (ref 0.7–3.1)
Lymphs: 30 %
MCH: 28.9 pg (ref 26.6–33.0)
MCHC: 33.3 g/dL (ref 31.5–35.7)
MCV: 87 fL (ref 79–97)
Monocytes Absolute: 0.6 10*3/uL (ref 0.1–0.9)
Monocytes: 8 %
Neutrophils Absolute: 4 10*3/uL (ref 1.4–7.0)
Neutrophils: 60 %
Platelets: 249 10*3/uL (ref 150–450)
RBC: 4.63 x10E6/uL (ref 3.77–5.28)
RDW: 13.1 % (ref 11.7–15.4)
WBC: 6.7 10*3/uL (ref 3.4–10.8)

## 2020-08-28 LAB — URIC ACID: Uric Acid: 6.2 mg/dL (ref 3.0–7.2)

## 2020-08-28 MED ORDER — VITAMIN D (ERGOCALCIFEROL) 1.25 MG (50000 UNIT) PO CAPS
50000.0000 [IU] | ORAL_CAPSULE | ORAL | 0 refills | Status: DC
  Filled 2020-08-28: qty 8, 56d supply, fill #0

## 2020-08-28 NOTE — Patient Instructions (Signed)
-   Wear postop shoe at all times, okay to remove for prolonged periods of rest, bathing, sleeping - Start Rx weekly high-dose vitamin D, over-the-counter calcium 2000 mg daily - Continue your current daily over-the-counter vitamin D - For pain control, use rest, ice over a towel, elevation above the heart, and over-the-counter pain medications on an as-needed basis - Use foot symptoms as a guide for activity, come off of your feet if the fracture site becomes painful - Contact us if pain persist despite the above measures - Return for follow-up in 6 weeks, obtain new x-ray on the day of visit or 1 day prior to that visit for review - Contact us for any questions between now and then

## 2020-08-28 NOTE — Progress Notes (Signed)
Primary Care / Sports Medicine Office Visit  Patient Information:  Patient ID: Julie Castillo, female DOB: [birth year] Age: 65 y.o. MRN: [id number]   Julie Castillo is a pleasant 65 y.o. female presenting with the following:  Chief Complaint  Patient presents with   New Patient (Initial Visit)   Foot Pain    Left; over 2 weeks ago; no known injury; X-ray 08/27/20; last fall 01/2020, patient walks daily during lunch; lateral sharp, sore, achy pain; relieved by Tylenol, rest, and elevation; 8/10 pain    Review of Systems pertinent details above   Patient Active Problem List   Diagnosis Date Noted   Closed displaced fracture of fifth metatarsal bone of left foot 08/28/2020   Shoulder tendonitis, left 01/18/2018   Colon cancer screening    Benign neoplasm of ascending colon    Snoring 07/14/2017   Vitreous floaters of right eye 01/29/2017   S/P left TKA 10/19/2016   Mixed hyperlipidemia 10/02/2016   Lower extremity edema 03/27/2016   Vitamin D deficiency 03/27/2016   Seasonal allergies 03/27/2016   Ptosis of eyelid, left 03/27/2016   Essential hypertension 03/22/2015   Gastroesophageal reflux disease without esophagitis 03/22/2015   Hypothyroidism due to acquired atrophy of thyroid 03/22/2015   S/P right TKA 03/20/2014   Past Medical History:  Diagnosis Date   Arthritis    Edema    lower extremities after standing a long time    Family history of adverse reaction to anesthesia    father- post op became violent    Foot drop, right 2016   developed foot drop following RTKA in 2016; had to wear foot drop shoe following discharge ; per pat "i had pain in my ankle real bad but it ended up being dx as foot drop"   GERD (gastroesophageal reflux disease)    GIB (gastrointestinal bleeding) 07/08/2017   Hypertension    Hypothyroidism    Pneumonia    hx of    PONV (postoperative nausea and vomiting)    post op day 1    Varicose veins    Outpatient Encounter Medications as of  08/28/2020  Medication Sig   acetaminophen (TYLENOL) 500 MG tablet Take 1,000 mg by mouth every 6 (six) hours as needed for moderate pain.   amLODipine (NORVASC) 5 MG tablet TAKE 1 TABLET (5 MG TOTAL) BY MOUTH DAILY.   Cholecalciferol (VITAMIN D) 2000 units CAPS Take 2,000 Units by mouth daily.   Cyanocobalamin (VITAMIN B 12 PO) Take 1 tablet by mouth daily.   docusate sodium (COLACE) 100 MG capsule Take 1 capsule (100 mg total) by mouth 2 (two) times daily. (Patient taking differently: Take 100 mg by mouth daily.)   enalapril (VASOTEC) 20 MG tablet TAKE 1 TABLET (20 MG TOTAL) BY MOUTH 2 (TWO) TIMES DAILY.   fluticasone (FLONASE) 50 MCG/ACT nasal spray PLACE 2 SPRAYS INTO BOTH NOSTRILS ONCE A DAY.   hydrochlorothiazide (HYDRODIURIL) 25 MG tablet TAKE 1 TABLET (25 MG TOTAL) BY MOUTH DAILY.   indomethacin (INDOCIN) 50 MG capsule Take 1 capsule (50 mg total) by mouth 2 (two) times daily with a meal. Take twice a day for 5 days as needed for gout   levothyroxine (SYNTHROID) 25 MCG tablet TAKE 1 TABLET (25 MCG TOTAL) BY MOUTH DAILY BEFORE BREAKFAST.   omeprazole (PRILOSEC) 20 MG capsule TAKE 1 CAPSULE BY MOUTH TWICE DAILY   phentermine (ADIPEX-P) 37.5 MG tablet Take 37.5 mg by mouth every morning. PRN   Pyridoxine HCl (  VITAMIN B6 PO) Take 1 tablet by mouth daily.   Vitamin D, Ergocalciferol, (DRISDOL) 1.25 MG (50000 UNIT) CAPS capsule Take 1 capsule (50,000 Units total) by mouth every 7 (seven) days. Take for 8 total doses(weeks)   No facility-administered encounter medications on file as of 08/28/2020.   Past Surgical History:  Procedure Laterality Date   BREAST CYST ASPIRATION Left 1988   "it just a fineneedle aspiration"    COLONOSCOPY N/A 07/10/2017   Procedure: COLONOSCOPY;  Surgeon: Toledo, Benay Pike, MD;  Location: [facility];  Service: Gastroenterology;  Laterality: N/A;   COLONOSCOPY WITH PROPOFOL N/A 11/11/2017   Procedure: COLONOSCOPY WITH PROPOFOL WITH BIOPSIES;  Surgeon: Lucilla Lame,  MD;  Location: [place];  Service: Endoscopy;  Laterality: N/A;   ESOPHAGOGASTRODUODENOSCOPY N/A 07/10/2017   Procedure: ESOPHAGOGASTRODUODENOSCOPY (EGD);  Surgeon: Toledo, Benay Pike, MD;  Location: [facility];  Service: Gastroenterology;  Laterality: N/A;   EYE SURGERY  2001,2005   left eye surgery x 2 ( eyelid)    POLYPECTOMY N/A 11/11/2017   Procedure: POLYPECTOMY;  Surgeon: Lucilla Lame, MD;  Location: [place];  Service: Endoscopy;  Laterality: N/A;   TOTAL KNEE ARTHROPLASTY Right 03/20/2014   Procedure: RIGHT TOTAL KNEE ARTHROPLASTY;  Surgeon: Mauri Pole, MD;  Location: [facility];  Service: Orthopedics;  Laterality: Right;   TOTAL KNEE ARTHROPLASTY Left 10/19/2016   Procedure: LEFT TOTAL KNEE ARTHROPLASTY;  Surgeon: Paralee Cancel, MD;  Location: [facility];  Service: Orthopedics;  Laterality: Left;   VARICOSE VEIN SURGERY  2007    Vitals:   08/28/20 1034  BP: (!) 144/90  Pulse: 78  Temp: 98.1 F (36.7 C)  SpO2: 100%   Vitals:   08/28/20 1034  Weight: 179 lb 12.8 oz (81.6 kg)  Height: 5\' 3"  (1.6 m)   Body mass index is 31.85 kg/m.  DG Foot Complete Left  Result Date: 08/27/2020 CLINICAL DATA:  Left foot pain.  Pain over fifth metatarsal bone. EXAM: LEFT FOOT - COMPLETE 3+ VIEW COMPARISON:  None. FINDINGS: Hallux valgus deformity. Minimally displaced fracture involving the distal fifth metatarsal bone. Fracture involving the metatarsal neck region. No definite involvement of the MTP joint. Mild cortical irregularity involving the proximal third metatarsal bone could represent an old injury. Prominent plantar calcaneal spurring. No focal soft tissue abnormality. IMPRESSION: 1. Minimally displaced fracture involving the distal fifth metatarsal bone. 2. Hallux valgus deformity. 3. Calcaneal spurring. Electronically Signed   By: Markus Daft M.D.   On: 08/27/2020 16:58     Independent interpretation of notes and tests performed by another provider:   Independent  interpretation of left foot x-ray dated 08/27/2020 reveals oblique mildly displaced fracture that is in overall anatomic alignment of the distal fifth metatarsal, this appears acute/subacute in nature, additionally noted significant hallux valgus and mild calcaneal spur  Procedures performed:   Patient was placed and fitted with postop shoe on the left  Pertinent History, Exam, Impression, and Recommendations:   Closed displaced fracture of fifth metatarsal bone of left foot 65 year old female presenting with roughly 2-3 weeks of left forefoot lateral progressive pain, swelling.  She denies any specific trauma but is an avid walker and has prolonged weightbearing throughout the day due to her work requirements.  She does not rule out the possibility of minor injury but nothing stands out in her mind.  Physical exam reveals warmth, swelling, mild erythema at the dorsal forefoot laterally, focal tenderness at the distal metatarsal, ankle exam reveals full range of motion with  mild discomfort during inversion, focal tenderness at the ATFL, deltoid, CFL, PT FL benign, remainder of foot and ankle benign, sensorimotor intact.  Recent radiographs reveal suddenly displaced fifth metatarsal fracture and overall well-preserved anatomic alignment, given her findings at the ankle and fracture pattern, most likely a minor inversion injury took place.  Given her overall reassuring clinical picture I have advised postop shoe, Rx high-dose vitamin D in addition to her current daily dosing, and additional OTC calcium 2000 mg daily.  She will conduct activity as tolerated using fracture site pain as a guide, return for follow-up in 6 weeks with repeat radiographs prior to that visit for review.  Pending interval fracture stability, callus formation, can discuss wean from shoe, return to full activity.  If suboptimal progress, continued shoe usage while outside of the home with a wean while in the home can be considered.     Orders & Medications Meds ordered this encounter  Medications   Vitamin D, Ergocalciferol, (DRISDOL) 1.25 MG (50000 UNIT) CAPS capsule    Sig: Take 1 capsule (50,000 Units total) by mouth every 7 (seven) days. Take for 8 total doses(weeks)    Dispense:  8 capsule    Refill:  0   Orders Placed This Encounter  Procedures   DG Foot Complete Left     Return in about 6 weeks (around 10/09/2020).     Montel Culver, MD   Primary Care Sports Medicine [place]

## 2020-08-28 NOTE — Assessment & Plan Note (Signed)
65 year old female presenting with roughly 2-3 weeks of left forefoot lateral progressive pain, swelling.  She denies any specific trauma but is an avid walker and has prolonged weightbearing throughout the day due to her work requirements.  She does not rule out the possibility of minor injury but nothing stands out in her mind.  Physical exam reveals warmth, swelling, mild erythema at the dorsal forefoot laterally, focal tenderness at the distal metatarsal, ankle exam reveals full range of motion with mild discomfort during inversion, focal tenderness at the ATFL, deltoid, CFL, PT FL benign, remainder of foot and ankle benign, sensorimotor intact.  Recent radiographs reveal suddenly displaced fifth metatarsal fracture and overall well-preserved anatomic alignment, given her findings at the ankle and fracture pattern, most likely a minor inversion injury took place.  Given her overall reassuring clinical picture I have advised postop shoe, Rx high-dose vitamin D in addition to her current daily dosing, and additional OTC calcium 2000 mg daily.  She will conduct activity as tolerated using fracture site pain as a guide, return for follow-up in 6 weeks with repeat radiographs prior to that visit for review.  Pending interval fracture stability, callus formation, can discuss wean from shoe, return to full activity.  If suboptimal progress, continued shoe usage while outside of the home with a wean while in the home can be considered.

## 2020-09-02 ENCOUNTER — Other Ambulatory Visit: Payer: Self-pay

## 2020-09-06 ENCOUNTER — Encounter: Payer: 59 | Admitting: Internal Medicine

## 2020-09-09 ENCOUNTER — Other Ambulatory Visit (HOSPITAL_COMMUNITY): Payer: Self-pay

## 2020-09-09 MED FILL — Amlodipine Besylate Tab 5 MG (Base Equivalent): ORAL | 90 days supply | Qty: 90 | Fill #1 | Status: AC

## 2020-09-09 MED FILL — Hydrochlorothiazide Tab 25 MG: ORAL | 90 days supply | Qty: 90 | Fill #1 | Status: AC

## 2020-09-09 MED FILL — Enalapril Maleate Tab 20 MG: ORAL | 90 days supply | Qty: 180 | Fill #1 | Status: AC

## 2020-09-11 ENCOUNTER — Other Ambulatory Visit (HOSPITAL_COMMUNITY): Payer: Self-pay

## 2020-09-16 ENCOUNTER — Telehealth: Payer: Self-pay | Admitting: Internal Medicine

## 2020-09-16 ENCOUNTER — Encounter: Payer: Self-pay | Admitting: Family Medicine

## 2020-09-16 ENCOUNTER — Ambulatory Visit
Admission: RE | Admit: 2020-09-16 | Discharge: 2020-09-16 | Disposition: A | Discharge status: Home / Self Care | Payer: 59 | Source: Ambulatory Visit | Attending: Family Medicine | Admitting: Family Medicine

## 2020-09-16 ENCOUNTER — Telehealth: Payer: Self-pay | Admitting: Family Medicine

## 2020-09-16 ENCOUNTER — Other Ambulatory Visit: Payer: Self-pay

## 2020-09-16 ENCOUNTER — Ambulatory Visit
Admission: RE | Admit: 2020-09-16 | Discharge: 2020-09-16 | Disposition: A | Discharge status: Home / Self Care | Payer: 59 | Attending: Family Medicine | Admitting: Family Medicine

## 2020-09-16 DIAGNOSIS — M25572 Pain in left ankle and joints of left foot: Secondary | ICD-10-CM | POA: Insufficient documentation

## 2020-09-16 DIAGNOSIS — S92352A Displaced fracture of fifth metatarsal bone, left foot, initial encounter for closed fracture: Secondary | ICD-10-CM | POA: Insufficient documentation

## 2020-09-16 DIAGNOSIS — S92352D Displaced fracture of fifth metatarsal bone, left foot, subsequent encounter for fracture with routine healing: Secondary | ICD-10-CM | POA: Diagnosis not present

## 2020-09-16 NOTE — Telephone Encounter (Signed)
    Primary Care / Sports Medicine Telephone Note  Patient Information:  Patient ID: Julie Castillo, female DOB: 06/06/1955 Age: 65 y.o. MRN: [id number]   Spoke to patient on phone today, agreed plan for X-ray orders; these placed for left foot and ankle that she will attempt to obtain today or tomorrow.  Montel Culver, MD   Primary Care Sports Medicine [place]

## 2020-09-16 NOTE — Telephone Encounter (Signed)
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  Please clarify this message.

## 2020-09-16 NOTE — Telephone Encounter (Signed)
Pt is calling to let Dr. Zigmund Daniel know that she did she the appt that was scheduled for her today for x-ray for 1:15p. But she is not able to make that appt today. She has a full schedule with work. And she is not sure when she will be able to go.

## 2020-09-16 NOTE — Telephone Encounter (Signed)
Please advise for worsening symptoms.

## 2020-09-17 NOTE — Progress Notes (Signed)
Per our phone call, x-rays show healing and no fracture at the ankle. Gradually wean from the fracture shoe to a firm-soled shoe - perform this at home first, once you are able to tolerate symptoms fully in the regular shoe while at home, then transition while outside of the home / work. Use fracture symptoms as a guide. Can contact me for any continued ankle pain. Keep visit as scheduled and get new foot x-ray prior to that visit.

## 2020-09-19 DIAGNOSIS — H2513 Age-related nuclear cataract, bilateral: Secondary | ICD-10-CM | POA: Diagnosis not present

## 2020-10-14 ENCOUNTER — Other Ambulatory Visit: Payer: Self-pay

## 2020-10-14 ENCOUNTER — Ambulatory Visit: Payer: 59 | Admitting: Family Medicine

## 2020-10-14 ENCOUNTER — Encounter: Payer: Self-pay | Admitting: Family Medicine

## 2020-10-14 VITALS — BP 134/90 | HR 74 | Temp 98.0°F | Ht 63.0 in | Wt 180.0 lb

## 2020-10-14 DIAGNOSIS — S92352D Displaced fracture of fifth metatarsal bone, left foot, subsequent encounter for fracture with routine healing: Secondary | ICD-10-CM

## 2020-10-14 NOTE — Patient Instructions (Addendum)
-   Wean and discontinue fracture shoe over the next few weeks (start at home with increasing time without fracture shoe) - Once you are able to tolerate all weightbearing at home without shoe, then wean and discontinue out of home (use sturdy shoe to begin with) - Start and advance home exercises with information provided - Finish out remaining prescription Vitamin D - Contact for any questions or recurrent / worsening symptoms - Follow-up as-needed

## 2020-10-14 NOTE — Progress Notes (Signed)
Primary Care / Sports Medicine Office Visit  Patient Information:  Patient ID: FLORICE PHILIPP, female DOB: 11/17/1955 Age: 65 y.o. MRN: [id number]   MERRISSA GERVASI is a pleasant 65 y.o. female presenting with the following:  Chief Complaint  Patient presents with   Follow-up   Closed displaced fracture of fifth metatarsal bone of left     Last X-rays 09/16/20; no pain in office today    Review of Systems pertinent details above   Patient Active Problem List   Diagnosis Date Noted   Closed displaced fracture of fifth metatarsal bone of left foot 08/28/2020   Shoulder tendonitis, left 01/18/2018   Colon cancer screening    Benign neoplasm of ascending colon    Snoring 07/14/2017   Vitreous floaters of right eye 01/29/2017   S/P left TKA 10/19/2016   Mixed hyperlipidemia 10/02/2016   Lower extremity edema 03/27/2016   Vitamin D deficiency 03/27/2016   Seasonal allergies 03/27/2016   Ptosis of eyelid, left 03/27/2016   Essential hypertension 03/22/2015   Gastroesophageal reflux disease without esophagitis 03/22/2015   Hypothyroidism due to acquired atrophy of thyroid 03/22/2015   S/P right TKA 03/20/2014   Past Medical History:  Diagnosis Date   Arthritis    Edema    lower extremities after standing a long time    Family history of adverse reaction to anesthesia    father- post op became violent    Foot drop, right 2016   developed foot drop following RTKA in 2016; had to wear foot drop shoe following discharge ; per pat "i had pain in my ankle real bad but it ended up being dx as foot drop"   GERD (gastroesophageal reflux disease)    GIB (gastrointestinal bleeding) 07/08/2017   Hypertension    Hypothyroidism    Pneumonia    hx of    PONV (postoperative nausea and vomiting)    post op day 1    Varicose veins    Outpatient Encounter Medications as of 10/14/2020  Medication Sig   acetaminophen (TYLENOL) 500 MG tablet Take 1,000 mg by mouth every 6 (six) hours as needed  for moderate pain.   amLODipine (NORVASC) 5 MG tablet TAKE 1 TABLET (5 MG TOTAL) BY MOUTH DAILY.   Cholecalciferol (VITAMIN D) 2000 units CAPS Take 2,000 Units by mouth daily.   Cyanocobalamin (VITAMIN B 12 PO) Take 1 tablet by mouth daily.   docusate sodium (COLACE) 100 MG capsule Take 1 capsule (100 mg total) by mouth 2 (two) times daily. (Patient taking differently: Take 100 mg by mouth daily.)   enalapril (VASOTEC) 20 MG tablet TAKE 1 TABLET (20 MG TOTAL) BY MOUTH 2 (TWO) TIMES DAILY.   fluticasone (FLONASE) 50 MCG/ACT nasal spray PLACE 2 SPRAYS INTO BOTH NOSTRILS ONCE A DAY.   hydrochlorothiazide (HYDRODIURIL) 25 MG tablet TAKE 1 TABLET (25 MG TOTAL) BY MOUTH DAILY.   indomethacin (INDOCIN) 50 MG capsule Take 1 capsule (50 mg total) by mouth 2 (two) times daily with a meal. Take twice a day for 5 days as needed for gout   levothyroxine (SYNTHROID) 25 MCG tablet TAKE 1 TABLET (25 MCG TOTAL) BY MOUTH DAILY BEFORE BREAKFAST.   omeprazole (PRILOSEC) 20 MG capsule TAKE 1 CAPSULE BY MOUTH TWICE DAILY   phentermine (ADIPEX-P) 37.5 MG tablet Take 37.5 mg by mouth every morning. PRN   Pyridoxine HCl (VITAMIN B6 PO) Take 1 tablet by mouth daily.   Vitamin D, Ergocalciferol, (DRISDOL) 1.25 MG (50000  UNIT) CAPS capsule Take 1 capsule (50,000 Units total) by mouth every 7 (seven) days. Take for 8 total doses(weeks)   No facility-administered encounter medications on file as of 10/14/2020.   Past Surgical History:  Procedure Laterality Date   BREAST CYST ASPIRATION Left 1988   "it just a fineneedle aspiration"    COLONOSCOPY N/A 07/10/2017   Procedure: COLONOSCOPY;  Surgeon: Toledo, Benay Pike, MD;  Location: [facility];  Service: Gastroenterology;  Laterality: N/A;   COLONOSCOPY WITH PROPOFOL N/A 11/11/2017   Procedure: COLONOSCOPY WITH PROPOFOL WITH BIOPSIES;  Surgeon: Lucilla Lame, MD;  Location: [place];  Service: Endoscopy;  Laterality: N/A;   ESOPHAGOGASTRODUODENOSCOPY N/A 07/10/2017    Procedure: ESOPHAGOGASTRODUODENOSCOPY (EGD);  Surgeon: Toledo, Benay Pike, MD;  Location: [facility];  Service: Gastroenterology;  Laterality: N/A;   EYE SURGERY  2001,2005   left eye surgery x 2 ( eyelid)    POLYPECTOMY N/A 11/11/2017   Procedure: POLYPECTOMY;  Surgeon: Lucilla Lame, MD;  Location: [place];  Service: Endoscopy;  Laterality: N/A;   TOTAL KNEE ARTHROPLASTY Right 03/20/2014   Procedure: RIGHT TOTAL KNEE ARTHROPLASTY;  Surgeon: Mauri Pole, MD;  Location: [facility];  Service: Orthopedics;  Laterality: Right;   TOTAL KNEE ARTHROPLASTY Left 10/19/2016   Procedure: LEFT TOTAL KNEE ARTHROPLASTY;  Surgeon: Paralee Cancel, MD;  Location: [facility];  Service: Orthopedics;  Laterality: Left;   VARICOSE VEIN SURGERY  2007    Vitals:   10/14/20 1117  BP: 134/90  Pulse: 74  Temp: 98 F (36.7 C)  SpO2: 99%   Vitals:   10/14/20 1117  Weight: 180 lb (81.6 kg)  Height: '5\' 3"'$  (1.6 m)   Body mass index is 31.89 kg/m.  DG Ankle Complete Left  Result Date: 09/17/2020 CLINICAL DATA:  Ankle pain in the setting of metatarsal fracture. Follow-up recent fifth metatarsal fracture. Increasing left ankle pain since wearing boot. EXAM: LEFT ANKLE COMPLETE - 3+ VIEW COMPARISON:  No prior ankle exams. Left foot exam performed and reported separately. FINDINGS: Imaging obtained weight-bearing. Normal ankle alignment. The ankle mortise is preserved. No fracture. No erosion, bony destruction or periosteal reaction. No ankle joint effusion. There is slight spurring of the dorsal talus at the capsular insertion. Moderate plantar calcaneal spur. IMPRESSION: 1. No acute osseous abnormality or explanation for ankle pain. 2. Moderate plantar calcaneal spur. Electronically Signed   By: Keith Rake M.D.   On: 09/17/2020 14:52   DG Foot Complete Left  Result Date: 09/17/2020 CLINICAL DATA:  Follow-up fifth metatarsal fracture. Closed displaced fracture of fifth metatarsal bone of left foot,  initial encounter. EXAM: LEFT FOOT - COMPLETE 3+ VIEW COMPARISON:  Foot radiograph 08/27/2020 FINDINGS: Imaging obtained weight-bearing. The distal fifth metatarsal fracture is unchanged in alignment. There is some peripheral incomplete callus formation. Fracture line is slightly less visible. No new fracture. Pes planus and hallux valgus. Degenerative change of the first metatarsal phalangeal joint and in the midfoot. Slight dorsal soft tissue edema. IMPRESSION: Healing distal fifth metatarsal fracture in unchanged alignment. There is peripheral incomplete callus formation. Electronically Signed   By: Keith Rake M.D.   On: 09/17/2020 14:54     Independent interpretation of notes and tests performed by another provider:   Independent interpretation of left foot x-ray dated 09/17/2020 reveals interval maintained alignment without displacement, there is blunting of the fifth distal metatarsal fracture line though it is still visible, significant callus formation noted about the fracture.  Procedures performed:   None  Pertinent  History, Exam, Impression, and Recommendations:   Closed displaced fracture of fifth metatarsal bone of left foot Recent x-rays and clinical features show excellent progress. At this stage her examination is significant for  I have advised finishing out the prescription vitamin D course previously prescribed, start home exercises in a graded manner with materials provided today, and to wean from fracture shoe in a stepwise approach. She is to contact us for any worsening pain or lack of adequate improvement over the next few months. If suboptimal progress, repeat foot x-ray planned.    Orders & Medications No orders of the defined types were placed in this encounter.  No orders of the defined types were placed in this encounter.    No follow-ups on file.     Montel Culver, MD   Primary Care Sports Medicine [place]

## 2020-10-14 NOTE — Assessment & Plan Note (Signed)
Recent x-rays and clinical features show excellent progress. At this stage her examination is significant for  I have advised finishing out the prescription vitamin D course previously prescribed, start home exercises in a graded manner with materials provided today, and to wean from fracture shoe in a stepwise approach. She is to contact us for any worsening pain or lack of adequate improvement over the next few months. If suboptimal progress, repeat foot x-ray planned.

## 2020-10-25 ENCOUNTER — Other Ambulatory Visit (HOSPITAL_BASED_OUTPATIENT_CLINIC_OR_DEPARTMENT_OTHER): Payer: Self-pay

## 2020-10-31 ENCOUNTER — Other Ambulatory Visit (HOSPITAL_COMMUNITY): Payer: Self-pay

## 2020-11-13 ENCOUNTER — Ambulatory Visit (INDEPENDENT_AMBULATORY_CARE_PROVIDER_SITE_OTHER): Payer: 59 | Admitting: Internal Medicine

## 2020-11-13 ENCOUNTER — Other Ambulatory Visit: Payer: Self-pay

## 2020-11-13 ENCOUNTER — Other Ambulatory Visit (HOSPITAL_COMMUNITY): Payer: Self-pay

## 2020-11-13 ENCOUNTER — Encounter: Payer: Self-pay | Admitting: Internal Medicine

## 2020-11-13 VITALS — BP 126/78 | HR 67 | Temp 97.8°F | Ht 63.0 in | Wt 176.0 lb

## 2020-11-13 DIAGNOSIS — K219 Gastro-esophageal reflux disease without esophagitis: Secondary | ICD-10-CM

## 2020-11-13 DIAGNOSIS — I10 Essential (primary) hypertension: Secondary | ICD-10-CM | POA: Diagnosis not present

## 2020-11-13 DIAGNOSIS — Z0000 Encounter for general adult medical examination without abnormal findings: Secondary | ICD-10-CM

## 2020-11-13 DIAGNOSIS — Z1231 Encounter for screening mammogram for malignant neoplasm of breast: Secondary | ICD-10-CM

## 2020-11-13 DIAGNOSIS — E559 Vitamin D deficiency, unspecified: Secondary | ICD-10-CM | POA: Diagnosis not present

## 2020-11-13 DIAGNOSIS — E034 Atrophy of thyroid (acquired): Secondary | ICD-10-CM | POA: Diagnosis not present

## 2020-11-13 DIAGNOSIS — Z13820 Encounter for screening for osteoporosis: Secondary | ICD-10-CM | POA: Diagnosis not present

## 2020-11-13 LAB — POCT URINALYSIS DIPSTICK
Bilirubin, UA: NEGATIVE
Blood, UA: NEGATIVE
Glucose, UA: NEGATIVE
Ketones, UA: NEGATIVE
Nitrite, UA: NEGATIVE
Protein, UA: NEGATIVE
Spec Grav, UA: 1.01 (ref 1.010–1.025)
Urobilinogen, UA: 0.2 E.U./dL
pH, UA: 6.5 (ref 5.0–8.0)

## 2020-11-13 NOTE — Progress Notes (Signed)
Date:  11/13/2020   Name:  Julie Castillo   DOB:  27-Jun-1955   MRN:  [id number]   Chief Complaint: Annual Exam (Breast exam no pap) Julie Castillo is a 65 y.o. female who presents today for her Complete Annual Exam. She feels well. She reports exercising walking 3-4 days a week. She reports she is sleeping well. Breast complaints none.  Mammogram: 01/2020 DEXA: 01/2012 osteopenia Pap smear: discontinued Colonoscopy: 10/2017 repeat 5 yrs  Immunization History  Administered Date(s) Administered   Influenza Split 11/22/2013, 12/03/2014   Influenza-Unspecified 12/21/2018, 11/22/2019   MMR 07/08/2017   PFIZER(Purple Top)SARS-COV-2 Vaccination 02/20/2019, 03/13/2019, 12/15/2019, 05/31/2020   Pneumococcal Conjugate-13 09/01/2019   Tdap 07/01/2017   Zoster Recombinat (Shingrix) 03/07/2020, 06/11/2020   Zoster, Live 02/23/2013    Hypertension This is a chronic problem. The problem is controlled. Pertinent negatives include no chest pain, headaches, palpitations or shortness of breath. Past treatments include calcium channel blockers, ACE inhibitors and diuretics. The current treatment provides significant improvement. Identifiable causes of hypertension include a thyroid problem.  Gastroesophageal Reflux She complains of heartburn. She reports no abdominal pain, no chest pain, no coughing or no wheezing. This is a recurrent problem. The problem occurs occasionally. Pertinent negatives include no fatigue. She has tried a PPI for the symptoms.  Thyroid Problem Presents for follow-up visit. Patient reports no anxiety, constipation, diarrhea, fatigue, palpitations or tremors. The symptoms have been stable.   Lab Results  Component Value Date   CREATININE 0.80 09/01/2019   BUN 25 09/01/2019   NA 137 09/01/2019   K 3.7 09/01/2019   CL 95 (L) 09/01/2019   CO2 28 09/01/2019   Lab Results  Component Value Date   CHOL 225 (H) 09/01/2019   HDL 118 09/01/2019   LDLCALC 98 09/01/2019   TRIG 49  09/01/2019   CHOLHDL 1.9 09/01/2019   Lab Results  Component Value Date   TSH 1.940 09/01/2019   No results found for: HGBA1C Lab Results  Component Value Date   WBC 6.7 08/27/2020   HGB 13.4 08/27/2020   HCT 40.2 08/27/2020   MCV 87 08/27/2020   PLT 249 08/27/2020   Lab Results  Component Value Date   ALT 8 09/01/2019   AST 14 09/01/2019   ALKPHOS 82 09/01/2019   BILITOT 0.4 09/01/2019  Last vitamin D Lab Results  Component Value Date   VD25OH 51.6 08/06/2017      Review of Systems  Constitutional:  Negative for chills, fatigue and fever.  HENT:  Negative for congestion, hearing loss, tinnitus, trouble swallowing and voice change.   Eyes:  Negative for visual disturbance.  Respiratory:  Negative for cough, chest tightness, shortness of breath and wheezing.   Cardiovascular:  Negative for chest pain, palpitations and leg swelling.  Gastrointestinal:  Positive for heartburn. Negative for abdominal pain, constipation, diarrhea and vomiting.  Endocrine: Negative for polydipsia and polyuria.  Genitourinary:  Negative for dysuria, frequency, genital sores, vaginal bleeding and vaginal discharge.  Musculoskeletal:  Negative for arthralgias, gait problem and joint swelling.  Skin:  Negative for color change and rash.  Neurological:  Negative for dizziness, tremors, light-headedness and headaches.  Hematological:  Negative for adenopathy. Does not bruise/bleed easily.  Psychiatric/Behavioral:  Negative for dysphoric mood and sleep disturbance. The patient is not nervous/anxious.    Patient Active Problem List   Diagnosis Date Noted   Closed displaced fracture of fifth metatarsal bone of left foot 08/28/2020   Shoulder tendonitis, left 01/18/2018  Benign neoplasm of ascending colon    Snoring 07/14/2017   Vitreous floaters of right eye 01/29/2017   S/P left TKA 10/19/2016   Mixed hyperlipidemia 10/02/2016   Lower extremity edema 03/27/2016   Vitamin D deficiency  03/27/2016   Seasonal allergies 03/27/2016   Ptosis of eyelid, left 03/27/2016   Essential hypertension 03/22/2015   Gastroesophageal reflux disease without esophagitis 03/22/2015   Hypothyroidism due to acquired atrophy of thyroid 03/22/2015   S/P right TKA 03/20/2014    Allergies  Allergen Reactions   Nsaids Other (See Comments)    GI BLEED   Sulfa Antibiotics Hives   Tape Other (See Comments) and Rash    Sensitive skin- can use paper tape bandaids- make skin red  Sensitive skin- can use paper tape    Past Surgical History:  Procedure Laterality Date   BREAST CYST ASPIRATION Left 1988   "it just a fineneedle aspiration"    COLONOSCOPY N/A 07/10/2017   Procedure: COLONOSCOPY;  Surgeon: Toledo, Benay Pike, MD;  Location: [facility];  Service: Gastroenterology;  Laterality: N/A;   COLONOSCOPY WITH PROPOFOL N/A 11/11/2017   Procedure: COLONOSCOPY WITH PROPOFOL WITH BIOPSIES;  Surgeon: Lucilla Lame, MD;  Location: [place];  Service: Endoscopy;  Laterality: N/A;   ESOPHAGOGASTRODUODENOSCOPY N/A 07/10/2017   Procedure: ESOPHAGOGASTRODUODENOSCOPY (EGD);  Surgeon: Toledo, Benay Pike, MD;  Location: [facility];  Service: Gastroenterology;  Laterality: N/A;   EYE SURGERY  2001,2005   left eye surgery x 2 ( eyelid)    POLYPECTOMY N/A 11/11/2017   Procedure: POLYPECTOMY;  Surgeon: Lucilla Lame, MD;  Location: [place];  Service: Endoscopy;  Laterality: N/A;   TOTAL KNEE ARTHROPLASTY Right 03/20/2014   Procedure: RIGHT TOTAL KNEE ARTHROPLASTY;  Surgeon: Mauri Pole, MD;  Location: [facility];  Service: Orthopedics;  Laterality: Right;   TOTAL KNEE ARTHROPLASTY Left 10/19/2016   Procedure: LEFT TOTAL KNEE ARTHROPLASTY;  Surgeon: Paralee Cancel, MD;  Location: [facility];  Service: Orthopedics;  Laterality: Left;   VARICOSE VEIN SURGERY  2007    Social History   Tobacco Use   Smoking status: Never   Smokeless tobacco: Never  Vaping Use   Vaping Use: Never used   Substance Use Topics   Alcohol use: Yes   Drug use: Never     Medication list has been reviewed and updated.  Current Meds  Medication Sig   acetaminophen (TYLENOL) 500 MG tablet Take 1,000 mg by mouth every 6 (six) hours as needed for moderate pain.   amLODipine (NORVASC) 5 MG tablet TAKE 1 TABLET (5 MG TOTAL) BY MOUTH DAILY.   Cholecalciferol (VITAMIN D) 2000 units CAPS Take 2,000 Units by mouth daily.   Cyanocobalamin (VITAMIN B 12 PO) Take 1 tablet by mouth daily.   docusate sodium (COLACE) 100 MG capsule Take 1 capsule (100 mg total) by mouth 2 (two) times daily. (Patient taking differently: Take 100 mg by mouth daily.)   enalapril (VASOTEC) 20 MG tablet TAKE 1 TABLET (20 MG TOTAL) BY MOUTH 2 (TWO) TIMES DAILY.   fluticasone (FLONASE) 50 MCG/ACT nasal spray PLACE 2 SPRAYS INTO BOTH NOSTRILS ONCE A DAY.   hydrochlorothiazide (HYDRODIURIL) 25 MG tablet TAKE 1 TABLET (25 MG TOTAL) BY MOUTH DAILY.   levothyroxine (SYNTHROID) 25 MCG tablet TAKE 1 TABLET (25 MCG TOTAL) BY MOUTH DAILY BEFORE BREAKFAST.   loratadine (CLARITIN REDITABS) 10 MG dissolvable tablet 10 mg   omeprazole (PRILOSEC) 20 MG capsule TAKE 1 CAPSULE BY MOUTH TWICE DAILY   phentermine (ADIPEX-P)  37.5 MG tablet Take 37.5 mg by mouth every morning. PRN   Pyridoxine HCl (VITAMIN B6 PO) Take 1 tablet by mouth daily.    PHQ 2/9 Scores 11/13/2020 10/14/2020 08/28/2020 08/27/2020  PHQ - 2 Score 0 0 0 0  PHQ- 9 Score 2 0 2 1    GAD 7 : Generalized Anxiety Score 11/13/2020 10/14/2020 08/28/2020 08/27/2020  Nervous, Anxious, on Edge 0 0 0 0  Control/stop worrying 0 0 0 0  Worry too much - different things 2 0 0 0  Trouble relaxing 0 0 0 0  Restless 0 0 0 0  Easily annoyed or irritable 1 0 0 0  Afraid - awful might happen 0 0 0 0  Total GAD 7 Score 3 0 0 0  Anxiety Difficulty - Not difficult at all Not difficult at all -    BP Readings from Last 3 Encounters:  11/13/20 126/78  10/14/20 134/90  08/28/20 (!) 144/90    Physical  Exam Vitals and nursing note reviewed.  Constitutional:      General: She is not in acute distress.    Appearance: She is well-developed.  HENT:     Head: Normocephalic and atraumatic.     Right Ear: Tympanic membrane and ear canal normal.     Left Ear: Tympanic membrane and ear canal normal.     Nose:     Right Sinus: No maxillary sinus tenderness.     Left Sinus: No maxillary sinus tenderness.  Eyes:     General: No scleral icterus.       Right eye: No discharge.        Left eye: No discharge.     Conjunctiva/sclera: Conjunctivae normal.  Neck:     Thyroid: No thyromegaly.     Vascular: No carotid bruit.  Cardiovascular:     Rate and Rhythm: Normal rate and regular rhythm.     Pulses: Normal pulses.     Heart sounds: Normal heart sounds.  Pulmonary:     Effort: Pulmonary effort is normal. No respiratory distress.     Breath sounds: No wheezing.  Chest:  Breasts:    Right: No mass, nipple discharge, skin change or tenderness.     Left: No mass, nipple discharge, skin change or tenderness.  Abdominal:     General: Bowel sounds are normal.     Palpations: Abdomen is soft.     Tenderness: There is no abdominal tenderness.  Musculoskeletal:     Cervical back: Normal range of motion. No erythema.     Right lower leg: No edema.     Left lower leg: No edema.  Lymphadenopathy:     Cervical: No cervical adenopathy.  Skin:    General: Skin is warm and dry.     Findings: No rash.  Neurological:     Mental Status: She is alert and oriented to person, place, and time.     Cranial Nerves: No cranial nerve deficit.     Sensory: No sensory deficit.     Deep Tendon Reflexes: Reflexes are normal and symmetric.  Psychiatric:        Attention and Perception: Attention normal.        Mood and Affect: Mood normal.    Wt Readings from Last 3 Encounters:  11/13/20 176 lb (79.8 kg)  10/14/20 180 lb (81.6 kg)  08/28/20 179 lb 12.8 oz (81.6 kg)    BP 126/78   Pulse 67   Temp 97.8  F (36.6 C) (  Oral)   Ht 5' 3"  (1.6 m)   Wt 176 lb (79.8 kg)   SpO2 98%   BMI 31.18 kg/m   Assessment and Plan: 1. Annual physical exam Exam is normal except for weight. Encourage regular exercise and appropriate dietary changes. Foot is healing well.  No further pain. - Hemoglobin A1c - Lipid panel  2. Encounter for screening mammogram for breast cancer - MM 3D SCREEN BREAST BILATERAL  3. Essential hypertension Clinically stable exam with well controlled BP. Tolerating medications without side effects at this time. Pt to continue current regimen and low sodium diet; benefits of regular exercise as able discussed. - Comprehensive metabolic panel - POCT urinalysis dipstick  4. Hypothyroidism due to acquired atrophy of thyroid supplemented - TSH + free T4  5. Gastroesophageal reflux disease without esophagitis Symptoms well controlled on daily PPI No red flag signs such as weight loss, n/v, melena Will continue omeprazole. - CBC with Differential/Platelet  6. Encounter for screening for osteoporosis Recent fracture; continue vitamin D, calcium rich foods - DG Bone Density  7. Vitamin D deficiency Continue daily supplementation - VITAMIN D 25 Hydroxy (Vit-D Deficiency, Fractures)   Partially dictated using Editor, commissioning. Any errors are unintentional.  Halina Maidens, MD [facility]  11/13/2020

## 2020-11-14 ENCOUNTER — Ambulatory Visit (INDEPENDENT_AMBULATORY_CARE_PROVIDER_SITE_OTHER): Payer: 59

## 2020-11-14 DIAGNOSIS — Z23 Encounter for immunization: Secondary | ICD-10-CM

## 2020-11-14 LAB — COMPREHENSIVE METABOLIC PANEL
ALT: 13 IU/L (ref 0–32)
AST: 20 IU/L (ref 0–40)
Albumin/Globulin Ratio: 2.1 (ref 1.2–2.2)
Albumin: 4.6 g/dL (ref 3.8–4.8)
Alkaline Phosphatase: 77 IU/L (ref 44–121)
BUN/Creatinine Ratio: 23 (ref 12–28)
BUN: 17 mg/dL (ref 8–27)
Bilirubin Total: 0.4 mg/dL (ref 0.0–1.2)
CO2: 27 mmol/L (ref 20–29)
Calcium: 9.7 mg/dL (ref 8.7–10.3)
Chloride: 95 mmol/L — ABNORMAL LOW (ref 96–106)
Creatinine, Ser: 0.75 mg/dL (ref 0.57–1.00)
Globulin, Total: 2.2 g/dL (ref 1.5–4.5)
Glucose: 85 mg/dL (ref 65–99)
Potassium: 4.5 mmol/L (ref 3.5–5.2)
Sodium: 136 mmol/L (ref 134–144)
Total Protein: 6.8 g/dL (ref 6.0–8.5)
eGFR: 88 mL/min/{1.73_m2} (ref >59)

## 2020-11-14 LAB — HEMOGLOBIN A1C
Est. average glucose Bld gHb Est-mCnc: 117 mg/dL
Hgb A1c MFr Bld: 5.7 % — ABNORMAL HIGH (ref 4.8–5.6)

## 2020-11-14 LAB — CBC WITH DIFFERENTIAL/PLATELET
Basophils Absolute: 0.1 10*3/uL (ref 0.0–0.2)
Basos: 1 %
EOS (ABSOLUTE): 0 10*3/uL (ref 0.0–0.4)
Eos: 1 %
Hematocrit: 41 % (ref 34.0–46.6)
Hemoglobin: 13.9 g/dL (ref 11.1–15.9)
Immature Grans (Abs): 0 10*3/uL (ref 0.0–0.1)
Immature Granulocytes: 0 %
Lymphocytes Absolute: 1.5 10*3/uL (ref 0.7–3.1)
Lymphs: 32 %
MCH: 29.8 pg (ref 26.6–33.0)
MCHC: 33.9 g/dL (ref 31.5–35.7)
MCV: 88 fL (ref 79–97)
Monocytes Absolute: 0.4 10*3/uL (ref 0.1–0.9)
Monocytes: 9 %
Neutrophils Absolute: 2.7 10*3/uL (ref 1.4–7.0)
Neutrophils: 57 %
Platelets: 261 10*3/uL (ref 150–450)
RBC: 4.66 x10E6/uL (ref 3.77–5.28)
RDW: 13.1 % (ref 11.7–15.4)
WBC: 4.7 10*3/uL (ref 3.4–10.8)

## 2020-11-14 LAB — LIPID PANEL
Chol/HDL Ratio: 2 ratio (ref 0.0–4.4)
Cholesterol, Total: 188 mg/dL (ref 100–199)
HDL: 95 mg/dL (ref >39)
LDL Chol Calc (NIH): 82 mg/dL (ref 0–99)
Triglycerides: 59 mg/dL (ref 0–149)
VLDL Cholesterol Cal: 11 mg/dL (ref 5–40)

## 2020-11-14 LAB — TSH+FREE T4
Free T4: 1.23 ng/dL (ref 0.82–1.77)
TSH: 1.66 u[IU]/mL (ref 0.450–4.500)

## 2020-11-14 LAB — VITAMIN D 25 HYDROXY (VIT D DEFICIENCY, FRACTURES): Vit D, 25-Hydroxy: 51.5 ng/mL (ref 30.0–100.0)

## 2020-11-20 ENCOUNTER — Other Ambulatory Visit: Payer: Self-pay

## 2020-11-22 ENCOUNTER — Other Ambulatory Visit: Payer: Self-pay

## 2020-11-22 ENCOUNTER — Ambulatory Visit: Payer: 59 | Attending: Internal Medicine

## 2020-11-22 DIAGNOSIS — Z23 Encounter for immunization: Secondary | ICD-10-CM

## 2020-11-22 MED ORDER — PFIZER COVID-19 VAC BIVALENT 30 MCG/0.3ML IM SUSP
INTRAMUSCULAR | 0 refills | Status: DC
Start: 2020-11-22 — End: 2021-05-12
  Filled 2020-11-22: qty 0.3, 30d supply, fill #0

## 2020-11-22 NOTE — Progress Notes (Signed)
   Covid-19 Vaccination Clinic  Name:  ELLORY KHURANA    MRN: [id number] DOB: [birth year]  11/22/2020  Ms. Fahrner was observed post Covid-19 immunization for 15 minutes without incident. She was provided with Vaccine Information Sheet and instruction to access the V-Safe system.   Ms. Cooperwood was instructed to call 911 with any severe reactions post vaccine: Difficulty breathing  Swelling of face and throat  A fast heartbeat  A bad rash all over body  Dizziness and weakness   Lu Duffel, PharmD, MBA Clinical Acute Care Pharmacist

## 2020-11-27 ENCOUNTER — Other Ambulatory Visit (HOSPITAL_COMMUNITY): Payer: Self-pay

## 2020-11-27 ENCOUNTER — Other Ambulatory Visit: Payer: Self-pay | Admitting: Internal Medicine

## 2020-11-27 DIAGNOSIS — K219 Gastro-esophageal reflux disease without esophagitis: Secondary | ICD-10-CM

## 2020-11-27 MED ORDER — OMEPRAZOLE 20 MG PO CPDR
20.0000 mg | DELAYED_RELEASE_CAPSULE | Freq: Two times a day (BID) | ORAL | 0 refills | Status: DC
  Filled 2020-11-27: qty 180, 90d supply, fill #0

## 2020-11-27 MED FILL — Levothyroxine Sodium Tab 25 MCG: ORAL | 90 days supply | Qty: 90 | Fill #1 | Status: AC

## 2020-12-06 ENCOUNTER — Other Ambulatory Visit (HOSPITAL_COMMUNITY): Payer: Self-pay

## 2020-12-06 MED FILL — Amlodipine Besylate Tab 5 MG (Base Equivalent): ORAL | 90 days supply | Qty: 90 | Fill #2 | Status: AC

## 2020-12-06 MED FILL — Hydrochlorothiazide Tab 25 MG: ORAL | 90 days supply | Qty: 90 | Fill #2 | Status: AC

## 2020-12-06 MED FILL — Enalapril Maleate Tab 20 MG: ORAL | 90 days supply | Qty: 180 | Fill #2 | Status: AC

## 2020-12-11 ENCOUNTER — Other Ambulatory Visit: Payer: Self-pay

## 2020-12-11 ENCOUNTER — Other Ambulatory Visit: Payer: Self-pay | Admitting: Physician Assistant

## 2020-12-11 ENCOUNTER — Ambulatory Visit
Admission: RE | Admit: 2020-12-11 | Discharge: 2020-12-11 | Disposition: A | Discharge status: Home / Self Care | Payer: PRIVATE HEALTH INSURANCE | Attending: Physician Assistant | Admitting: Physician Assistant

## 2020-12-11 ENCOUNTER — Other Ambulatory Visit (HOSPITAL_COMMUNITY): Payer: Self-pay

## 2020-12-11 ENCOUNTER — Ambulatory Visit
Admission: RE | Admit: 2020-12-11 | Discharge: 2020-12-11 | Disposition: A | Discharge status: Home / Self Care | Payer: PRIVATE HEALTH INSURANCE | Source: Ambulatory Visit | Attending: Physician Assistant | Admitting: Physician Assistant

## 2020-12-11 DIAGNOSIS — S62617A Displaced fracture of proximal phalanx of left little finger, initial encounter for closed fracture: Secondary | ICD-10-CM | POA: Diagnosis not present

## 2020-12-11 DIAGNOSIS — W19XXXA Unspecified fall, initial encounter: Secondary | ICD-10-CM | POA: Insufficient documentation

## 2020-12-11 MED ORDER — HYDROCODONE-ACETAMINOPHEN 5-325 MG PO TABS
ORAL_TABLET | ORAL | 0 refills | Status: AC
  Filled 2020-12-11: qty 15, 3d supply, fill #0

## 2020-12-25 ENCOUNTER — Ambulatory Visit
Admission: RE | Admit: 2020-12-25 | Discharge: 2020-12-25 | Disposition: A | Discharge status: Home / Self Care | Payer: 59 | Source: Ambulatory Visit | Attending: Internal Medicine | Admitting: Internal Medicine

## 2020-12-25 ENCOUNTER — Other Ambulatory Visit: Payer: Self-pay

## 2020-12-25 DIAGNOSIS — M810 Age-related osteoporosis without current pathological fracture: Secondary | ICD-10-CM | POA: Diagnosis not present

## 2020-12-25 DIAGNOSIS — M8588 Other specified disorders of bone density and structure, other site: Secondary | ICD-10-CM | POA: Diagnosis not present

## 2020-12-25 DIAGNOSIS — Z13820 Encounter for screening for osteoporosis: Secondary | ICD-10-CM | POA: Diagnosis not present

## 2021-02-04 ENCOUNTER — Other Ambulatory Visit: Payer: Self-pay

## 2021-02-04 ENCOUNTER — Ambulatory Visit
Admission: RE | Admit: 2021-02-04 | Discharge: 2021-02-04 | Disposition: A | Discharge status: Home / Self Care | Payer: 59 | Source: Ambulatory Visit | Attending: Internal Medicine | Admitting: Internal Medicine

## 2021-02-04 DIAGNOSIS — Z1231 Encounter for screening mammogram for malignant neoplasm of breast: Secondary | ICD-10-CM | POA: Diagnosis not present

## 2021-03-05 ENCOUNTER — Other Ambulatory Visit (HOSPITAL_COMMUNITY): Payer: Self-pay

## 2021-03-05 ENCOUNTER — Other Ambulatory Visit: Payer: Self-pay | Admitting: Internal Medicine

## 2021-03-05 DIAGNOSIS — I10 Essential (primary) hypertension: Secondary | ICD-10-CM

## 2021-03-05 DIAGNOSIS — K219 Gastro-esophageal reflux disease without esophagitis: Secondary | ICD-10-CM

## 2021-03-05 DIAGNOSIS — E034 Atrophy of thyroid (acquired): Secondary | ICD-10-CM

## 2021-03-05 MED ORDER — AMLODIPINE BESYLATE 5 MG PO TABS
ORAL_TABLET | Freq: Every day | ORAL | 2 refills | Status: DC
  Filled 2021-03-05: qty 90, 90d supply, fill #0

## 2021-03-05 MED ORDER — LEVOTHYROXINE SODIUM 25 MCG PO TABS
ORAL_TABLET | Freq: Every day | ORAL | 2 refills | Status: DC
  Filled 2021-03-05: qty 90, 90d supply, fill #0

## 2021-03-05 MED ORDER — HYDROCHLOROTHIAZIDE 25 MG PO TABS
ORAL_TABLET | Freq: Every day | ORAL | 2 refills | Status: DC
  Filled 2021-03-05: qty 90, 90d supply, fill #0

## 2021-03-05 MED ORDER — ENALAPRIL MALEATE 20 MG PO TABS
ORAL_TABLET | Freq: Two times a day (BID) | ORAL | 2 refills | Status: DC
  Filled 2021-03-05: qty 180, 90d supply, fill #0

## 2021-03-05 MED ORDER — FLUTICASONE PROPIONATE 50 MCG/ACT NA SUSP
NASAL | 2 refills | Status: DC
  Filled 2021-03-05: qty 48, 90d supply, fill #0

## 2021-03-05 MED ORDER — OMEPRAZOLE 20 MG PO CPDR
20.0000 mg | DELAYED_RELEASE_CAPSULE | Freq: Two times a day (BID) | ORAL | 2 refills | Status: DC
  Filled 2021-03-05: qty 180, 90d supply, fill #0

## 2021-03-05 NOTE — Telephone Encounter (Signed)
Requested Prescriptions  Pending Prescriptions Disp Refills   levothyroxine (SYNTHROID) 25 MCG tablet 90 tablet 1    Sig: TAKE 1 TABLET (25 MCG TOTAL) BY MOUTH DAILY BEFORE BREAKFAST.     Endocrinology:  Hypothyroid Agents Failed - 03/05/2021 10:08 AM      Failed - TSH needs to be rechecked within 3 months after an abnormal result. Refill until TSH is due.      Passed - TSH in normal range and within 360 days    TSH  Date Value Ref Range Status  11/13/2020 1.660 0.450 - 4.500 uIU/mL Final         Passed - Valid encounter within last 12 months    Recent Outpatient Visits          3 months ago Annual physical exam   [facility] Glean Hess, MD   4 months ago Closed displaced fracture of fifth metatarsal bone of left foot with routine healing, subsequent encounter   [facility] Montel Culver, MD   6 months ago Closed displaced fracture of fifth metatarsal bone of left foot, initial encounter   [facility] Montel Culver, MD   6 months ago Left foot pain   [facility] Glean Hess, MD   7 months ago COVID-19 virus infection   [facility] Glean Hess, MD      Future Appointments            In 2 months Army Melia Jesse Sans, MD [facility]   In 8 months Glean Hess, MD [facility]            hydrochlorothiazide (HYDRODIURIL) 25 MG tablet 90 tablet 3    Sig: TAKE 1 TABLET (25 MG TOTAL) BY MOUTH DAILY.     Cardiovascular: Diuretics - Thiazide Passed - 03/05/2021 10:08 AM      Passed - Ca in normal range and within 360 days    Calcium  Date Value Ref Range Status  11/13/2020 9.7 8.7 - 10.3 mg/dL Final   Calcium, Total  Date Value Ref Range Status  09/21/2012 9.3 8.5 - 10.1 mg/dL Final         Passed - Cr in normal range and within 360 days    Creatinine  Date Value Ref Range Status  09/21/2012 0.93 0.60 - 1.30 mg/dL Final   Creatinine, Ser  Date Value Ref Range  Status  11/13/2020 0.75 0.57 - 1.00 mg/dL Final         Passed - K in normal range and within 360 days    Potassium  Date Value Ref Range Status  11/13/2020 4.5 3.5 - 5.2 mmol/L Final  09/21/2012 3.9 3.5 - 5.1 mmol/L Final         Passed - Na in normal range and within 360 days    Sodium  Date Value Ref Range Status  11/13/2020 136 134 - 144 mmol/L Final  09/21/2012 140 136 - 145 mmol/L Final         Passed - Last BP in normal range    BP Readings from Last 1 Encounters:  11/13/20 126/78         Passed - Valid encounter within last 6 months    Recent Outpatient Visits          3 months ago Annual physical exam   [facility] Glean Hess, MD   4 months ago Closed displaced fracture of  fifth metatarsal bone of left foot with routine healing, subsequent encounter   [facility] Montel Culver, MD   6 months ago Closed displaced fracture of fifth metatarsal bone of left foot, initial encounter   [facility] Montel Culver, MD   6 months ago Left foot pain   [facility] Glean Hess, MD   7 months ago COVID-19 virus infection   [facility] Glean Hess, MD      Future Appointments            In 2 months Army Melia Jesse Sans, MD [facility]   In 8 months Glean Hess, MD [facility]            amLODipine (NORVASC) 5 MG tablet 90 tablet 3    Sig: TAKE 1 TABLET (5 MG TOTAL) BY MOUTH DAILY.     Cardiovascular:  Calcium Channel Blockers Passed - 03/05/2021 10:08 AM      Passed - Last BP in normal range    BP Readings from Last 1 Encounters:  11/13/20 126/78         Passed - Valid encounter within last 6 months    Recent Outpatient Visits          3 months ago Annual physical exam   [facility] Glean Hess, MD   4 months ago Closed displaced fracture of fifth metatarsal bone of left foot with routine healing, subsequent encounter   [facility] Montel Culver, MD   6 months ago Closed displaced fracture of fifth metatarsal bone of left foot, initial encounter   [facility] Montel Culver, MD   6 months ago Left foot pain   [facility] Glean Hess, MD   7 months ago COVID-19 virus infection   [facility] Glean Hess, MD      Future Appointments            In 2 months Army Melia Jesse Sans, MD [facility]   In 8 months Glean Hess, MD [facility]            enalapril (VASOTEC) 20 MG tablet 180 tablet 3    Sig: TAKE 1 TABLET (20 MG TOTAL) BY MOUTH 2 (TWO) TIMES DAILY.     Cardiovascular:  ACE Inhibitors Passed - 03/05/2021 10:08 AM      Passed - Cr in normal range and within 180 days    Creatinine  Date Value Ref Range Status  09/21/2012 0.93 0.60 - 1.30 mg/dL Final   Creatinine, Ser  Date Value Ref Range Status  11/13/2020 0.75 0.57 - 1.00 mg/dL Final         Passed - K in normal range and within 180 days    Potassium  Date Value Ref Range Status  11/13/2020 4.5 3.5 - 5.2 mmol/L Final  09/21/2012 3.9 3.5 - 5.1 mmol/L Final         Passed - Patient is not pregnant      Passed - Last BP in normal range    BP Readings from Last 1 Encounters:  11/13/20 126/78         Passed - Valid encounter within last 6 months    Recent Outpatient Visits          3 months ago Annual physical exam   [facility] Glean Hess, MD   4  months ago Closed displaced fracture of fifth metatarsal bone of left foot with routine healing, subsequent encounter   [facility] Montel Culver, MD   6 months ago Closed displaced fracture of fifth metatarsal bone of left foot, initial encounter   [facility] Montel Culver, MD   6 months ago Left foot pain   [facility] Glean Hess, MD   7 months ago COVID-19 virus infection   [facility] Glean Hess, MD      Future Appointments             In 2 months Army Melia Jesse Sans, MD [facility]   In 8 months Glean Hess, MD [facility]            fluticasone (FLONASE) 50 MCG/ACT nasal spray 48 g 1    Sig: PLACE 2 SPRAYS INTO BOTH NOSTRILS ONCE A DAY.     Ear, Nose, and Throat: Nasal Preparations - Corticosteroids Passed - 03/05/2021 10:08 AM      Passed - Valid encounter within last 12 months    Recent Outpatient Visits          3 months ago Annual physical exam   [facility] Glean Hess, MD   4 months ago Closed displaced fracture of fifth metatarsal bone of left foot with routine healing, subsequent encounter   [facility] Montel Culver, MD   6 months ago Closed displaced fracture of fifth metatarsal bone of left foot, initial encounter   [facility] Montel Culver, MD   6 months ago Left foot pain   [facility] Glean Hess, MD   7 months ago COVID-19 virus infection   [facility] Glean Hess, MD      Future Appointments            In 2 months Army Melia Jesse Sans, MD [facility]   In 8 months Army Melia Jesse Sans, MD [facility]            omeprazole (PRILOSEC) 20 MG capsule 180 capsule 0    Sig: TAKE 1 CAPSULE BY MOUTH TWICE DAILY     Gastroenterology: Proton Pump Inhibitors Passed - 03/05/2021 10:08 AM      Passed - Valid encounter within last 12 months    Recent Outpatient Visits          3 months ago Annual physical exam   [facility] Glean Hess, MD   4 months ago Closed displaced fracture of fifth metatarsal bone of left foot with routine healing, subsequent encounter   [facility] Montel Culver, MD   6 months ago Closed displaced fracture of fifth metatarsal bone of left foot, initial encounter   [facility] Montel Culver, MD   6 months ago Left foot pain   [facility] Glean Hess, MD   7 months  ago COVID-19 virus infection   [facility] Glean Hess, MD      Future Appointments            In 2 months Army Melia Jesse Sans, MD [facility] [place]   In 8 months Army Melia, Jesse Sans, MD [facility]

## 2021-03-06 ENCOUNTER — Other Ambulatory Visit (HOSPITAL_COMMUNITY): Payer: Self-pay

## 2021-05-12 ENCOUNTER — Other Ambulatory Visit: Payer: Self-pay

## 2021-05-12 ENCOUNTER — Ambulatory Visit (INDEPENDENT_AMBULATORY_CARE_PROVIDER_SITE_OTHER): Payer: Medicare Other | Admitting: Internal Medicine

## 2021-05-12 ENCOUNTER — Encounter: Payer: Self-pay | Admitting: Internal Medicine

## 2021-05-12 VITALS — BP 140/82 | HR 74 | Ht 63.0 in | Wt 182.2 lb

## 2021-05-12 DIAGNOSIS — K219 Gastro-esophageal reflux disease without esophagitis: Secondary | ICD-10-CM | POA: Diagnosis not present

## 2021-05-12 DIAGNOSIS — E034 Atrophy of thyroid (acquired): Secondary | ICD-10-CM

## 2021-05-12 DIAGNOSIS — I10 Essential (primary) hypertension: Secondary | ICD-10-CM

## 2021-05-12 MED ORDER — ENALAPRIL MALEATE 20 MG PO TABS: ORAL_TABLET | Freq: Two times a day (BID) | ORAL | 3 refills | Status: DC

## 2021-05-12 MED ORDER — HYDROCHLOROTHIAZIDE 25 MG PO TABS: ORAL_TABLET | Freq: Every day | ORAL | 3 refills | Status: DC

## 2021-05-12 MED ORDER — OMEPRAZOLE 20 MG PO CPDR: 20.0000 mg | DELAYED_RELEASE_CAPSULE | Freq: Two times a day (BID) | ORAL | 3 refills | Status: DC

## 2021-05-12 MED ORDER — LEVOTHYROXINE SODIUM 25 MCG PO TABS: ORAL_TABLET | Freq: Every day | ORAL | 3 refills | Status: DC

## 2021-05-12 MED ORDER — AMLODIPINE BESYLATE 5 MG PO TABS: ORAL_TABLET | Freq: Every day | ORAL | 3 refills | Status: DC

## 2021-05-12 NOTE — Progress Notes (Signed)
? ? ?Date:  05/12/2021  ? ?Name:  Julie Castillo   DOB:  16-Dec-1955   MRN:  [id number] ? ? ?Chief Complaint: Hypertension ? ?Hypertension ?This is a chronic problem. The current episode started more than 1 year ago. The problem is unchanged. The problem is controlled. Pertinent negatives include no chest pain, headaches, palpitations or shortness of breath. Identifiable causes of hypertension include a thyroid problem.  ?Thyroid Problem ?Presents for follow-up visit. Patient reports no anxiety, fatigue or palpitations. The symptoms have been stable.  ? ?Lab Results  ?Component Value Date  ? NA 136 11/13/2020  ? K 4.5 11/13/2020  ? CO2 27 11/13/2020  ? GLUCOSE 85 11/13/2020  ? BUN 17 11/13/2020  ? CREATININE 0.75 11/13/2020  ? CALCIUM 9.7 11/13/2020  ? EGFR 88 11/13/2020  ? GFRNONAA 78 09/01/2019  ? ?Lab Results  ?Component Value Date  ? CHOL 188 11/13/2020  ? HDL 95 11/13/2020  ? [place] 82 11/13/2020  ? TRIG 59 11/13/2020  ? CHOLHDL 2.0 11/13/2020  ? ?Lab Results  ?Component Value Date  ? TSH 1.660 11/13/2020  ? ?Lab Results  ?Component Value Date  ? HGBA1C 5.7 (H) 11/13/2020  ? ?Lab Results  ?Component Value Date  ? WBC 4.7 11/13/2020  ? HGB 13.9 11/13/2020  ? HCT 41.0 11/13/2020  ? MCV 88 11/13/2020  ? PLT 261 11/13/2020  ? ?Lab Results  ?Component Value Date  ? ALT 13 11/13/2020  ? AST 20 11/13/2020  ? ALKPHOS 77 11/13/2020  ? BILITOT 0.4 11/13/2020  ? ?Lab Results  ?Component Value Date  ? VD25OH 51.5 11/13/2020  ?  ? ?Review of Systems  ?Constitutional:  Negative for chills, fatigue and fever.  ?Respiratory:  Negative for chest tightness, shortness of breath and wheezing.   ?Cardiovascular:  Negative for chest pain, palpitations and leg swelling.  ?Neurological:  Negative for dizziness and headaches.  ?Psychiatric/Behavioral:  Negative for dysphoric mood and sleep disturbance. The patient is not nervous/anxious.   ? ?Patient Active Problem List  ? Diagnosis Date Noted  ? Closed displaced fracture of fifth  metatarsal bone of left foot 08/28/2020  ? Shoulder tendonitis, left 01/18/2018  ? Benign neoplasm of ascending colon   ? Snoring 07/14/2017  ? Vitreous floaters of right eye 01/29/2017  ? S/P left TKA 10/19/2016  ? Mixed hyperlipidemia 10/02/2016  ? Lower extremity edema 03/27/2016  ? Vitamin D deficiency 03/27/2016  ? Seasonal allergies 03/27/2016  ? Ptosis of eyelid, left 03/27/2016  ? Essential hypertension 03/22/2015  ? Gastroesophageal reflux disease without esophagitis 03/22/2015  ? 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 S/P right TKA 03/20/2014  ? ? ?Allergies  ?Allergen Reactions  ? Nsaids Other (See Comments)  ?  GI BLEED  ? Sulfa Antibiotics Hives  ? Tape Other (See Comments) and Rash  ?  Sensitive skin- can use paper tape ?bandaids- make skin red  ?Sensitive skin- can use paper tape  ? ? ?Past Surgical History:  ?Procedure Laterality Date  ? BREAST CYST ASPIRATION Left 1988  ? "it just a fineneedle aspiration"   ? COLONOSCOPY N/A 07/10/2017  ? Procedure: COLONOSCOPY;  Surgeon: Toledo, Benay Pike, MD;  Location: [facility];  Service: Gastroenterology;  Laterality: N/A;  ? COLONOSCOPY WITH PROPOFOL N/A 11/11/2017  ? Procedure: COLONOSCOPY WITH PROPOFOL WITH BIOPSIES;  Surgeon: Lucilla Lame, MD;  Location: [place];  Service: Endoscopy;  Laterality: N/A;  ? ESOPHAGOGASTRODUODENOSCOPY N/A 07/10/2017  ? Procedure: ESOPHAGOGASTRODUODENOSCOPY (EGD);  Surgeon:  Toledo, Benay Pike, MD;  Location: [facility];  Service: Gastroenterology;  Laterality: N/A;  ? EYE SURGERY  2001,2005  ? left eye surgery x 2 ( eyelid)   ? POLYPECTOMY N/A 11/11/2017  ? Procedure: POLYPECTOMY;  Surgeon: Lucilla Lame, MD;  Location: [place];  Service: Endoscopy;  Laterality: N/A;  ? TOTAL KNEE ARTHROPLASTY Right 03/20/2014  ? Procedure: RIGHT TOTAL KNEE ARTHROPLASTY;  Surgeon: Mauri Pole, MD;  Location: [facility];  Service: Orthopedics;  Laterality: Right;  ? TOTAL KNEE ARTHROPLASTY Left  10/19/2016  ? Procedure: LEFT TOTAL KNEE ARTHROPLASTY;  Surgeon: Paralee Cancel, MD;  Location: [facility];  Service: Orthopedics;  Laterality: Left;  ? VARICOSE VEIN SURGERY  2007  ? ? ?Social History  ? ?Tobacco Use  ? Smoking status: Never  ? Smokeless tobacco: Never  ?Vaping Use  ? Vaping Use: Never used  ?Substance Use Topics  ? Alcohol use: Yes  ? Drug use: Never  ? ? ? ?Medication list has been reviewed and updated. ? ?Current Meds  ?Medication Sig  ? acetaminophen (TYLENOL) 500 MG tablet Take 1,000 mg by mouth every 6 (six) hours as needed for moderate pain.  ? Calcium Carb-Cholecalciferol (CALCIUM 1000 + D PO) Take by mouth.  ? Cholecalciferol (VITAMIN D) 2000 units CAPS Take 2,000 Units by mouth daily.  ? Cyanocobalamin (VITAMIN B 12 PO) Take 1 tablet by mouth daily.  ? docusate sodium (COLACE) 100 MG capsule Take 1 capsule (100 mg total) by mouth 2 (two) times daily. (Patient taking differently: Take 100 mg by mouth daily.)  ? fluticasone (FLONASE) 50 MCG/ACT nasal spray PLACE 2 SPRAYS INTO BOTH NOSTRILS ONCE A DAY.  ? HYDROcodone-acetaminophen (NORCO/VICODIN) 5-325 MG tablet Take 1 to 2 tablets by mouth every 6 hours as needed for pain  ? loratadine (CLARITIN REDITABS) 10 MG dissolvable tablet 10 mg  ? phentermine (ADIPEX-P) 37.5 MG tablet Take 37.5 mg by mouth every morning. PRN  ? Pyridoxine HCl (VITAMIN B6 PO) Take 1 tablet by mouth daily.  ? [DISCONTINUED] amLODipine (NORVASC) 5 MG tablet TAKE 1 TABLET (5 MG TOTAL) BY MOUTH DAILY.  ? [DISCONTINUED] enalapril (VASOTEC) 20 MG tablet TAKE 1 TABLET (20 MG TOTAL) BY MOUTH 2 (TWO) TIMES DAILY.  ? [DISCONTINUED] hydrochlorothiazide (HYDRODIURIL) 25 MG tablet TAKE 1 TABLET (25 MG TOTAL) BY MOUTH DAILY.  ? [DISCONTINUED] levothyroxine (SYNTHROID) 25 MCG tablet TAKE 1 TABLET (25 MCG TOTAL) BY MOUTH DAILY BEFORE BREAKFAST.  ? [DISCONTINUED] omeprazole (PRILOSEC) 20 MG capsule TAKE 1 CAPSULE BY MOUTH TWICE DAILY  ? ? ?PHQ 2/9 Scores 05/12/2021 11/13/2020 10/14/2020  08/28/2020  ?PHQ - 2 Score 0 0 0 0  ?PHQ- 9 Score 0 2 0 2  ? ? ?GAD 7 : Generalized Anxiety Score 05/12/2021 11/13/2020 10/14/2020 08/28/2020  ?Nervous, Anxious, on Edge 0 0 0 0  ?Control/stop worrying 0 0 0 0  ?Worry too much - different things 0 2 0 0  ?Trouble relaxing 0 0 0 0  ?Restless 0 0 0 0  ?Easily annoyed or irritable 0 1 0 0  ?Afraid - awful might happen 0 0 0 0  ?Total GAD 7 Score 0 3 0 0  ?Anxiety Difficulty Not difficult at all - Not difficult at all Not difficult at all  ? ? ?BP Readings from Last 3 Encounters:  ?05/12/21 140/82  ?11/13/20 126/78  ?10/14/20 134/90  ? ? ?Physical Exam ?Vitals and nursing note reviewed.  ?Constitutional:   ?   General: She is not in  acute distress. ?   Appearance: She is well-developed.  ?HENT:  ?   Head: Normocephalic and atraumatic.  ?Cardiovascular:  ?   Rate and Rhythm: Normal rate and regular rhythm.  ?   Pulses: Normal pulses.  ?   Heart sounds: No murmur heard. ?Pulmonary:  ?   Effort: Pulmonary effort is normal. No respiratory distress.  ?   Breath sounds: No wheezing or rhonchi.  ?Musculoskeletal:  ?   Cervical back: Normal range of motion.  ?   Right lower leg: No edema.  ?   Left lower leg: No edema.  ?Lymphadenopathy:  ?   Cervical: No cervical adenopathy.  ?Skin: ?   General: Skin is warm and dry.  ?   Capillary Refill: Capillary refill takes less than 2 seconds.  ?   Findings: No rash.  ?Neurological:  ?   General: No focal deficit present.  ?   Mental Status: She is alert and oriented to person, place, and time.  ?Psychiatric:     ?   Mood and Affect: Mood normal.     ?   Behavior: Behavior normal.  ? ? ?Wt Readings from Last 3 Encounters:  ?05/12/21 182 lb 3.2 oz (82.6 kg)  ?11/13/20 176 lb (79.8 kg)  ?10/14/20 180 lb (81.6 kg)  ? ? ?BP 140/82 (BP Location: Right Arm, Cuff Size: Large)   Pulse 74   Ht 5' 3"  (1.6 m)   Wt 182 lb 3.2 oz (82.6 kg)   SpO2 99%   BMI 32.28 kg/m?  ? ?Assessment and Plan: ?1. Essential hypertension ?Clinically stable exam with well  controlled BP. ?Tolerating medications without side effects at this time. ?Pt to continue current regimen and low sodium diet; benefits of regular exercise as able discussed. ?- amLODipine (NORVASC) 5 MG tablet; TAKE 1

## 2021-05-13 ENCOUNTER — Ambulatory Visit: Payer: 59 | Admitting: Internal Medicine

## 2021-05-20 ENCOUNTER — Other Ambulatory Visit: Payer: Self-pay

## 2021-05-20 DIAGNOSIS — I10 Essential (primary) hypertension: Secondary | ICD-10-CM

## 2021-05-20 DIAGNOSIS — K219 Gastro-esophageal reflux disease without esophagitis: Secondary | ICD-10-CM

## 2021-05-20 DIAGNOSIS — E034 Atrophy of thyroid (acquired): Secondary | ICD-10-CM

## 2021-05-20 MED ORDER — FLUTICASONE PROPIONATE 50 MCG/ACT NA SUSP: NASAL | 2 refills | Status: AC

## 2021-05-20 MED ORDER — OMEPRAZOLE 20 MG PO CPDR: 20.0000 mg | DELAYED_RELEASE_CAPSULE | Freq: Two times a day (BID) | ORAL | 1 refills | Status: AC

## 2021-05-20 MED ORDER — AMLODIPINE BESYLATE 5 MG PO TABS: ORAL_TABLET | Freq: Every day | ORAL | 1 refills | Status: AC

## 2021-05-20 MED ORDER — ENALAPRIL MALEATE 20 MG PO TABS: ORAL_TABLET | Freq: Two times a day (BID) | ORAL | 1 refills | Status: AC

## 2021-05-20 MED ORDER — LEVOTHYROXINE SODIUM 25 MCG PO TABS: ORAL_TABLET | Freq: Every day | ORAL | 1 refills | Status: AC

## 2021-05-20 MED ORDER — HYDROCHLOROTHIAZIDE 25 MG PO TABS: ORAL_TABLET | Freq: Every day | ORAL | 1 refills | Status: AC

## 2021-06-30 ENCOUNTER — Encounter: Payer: Self-pay | Admitting: Internal Medicine

## 2021-11-17 ENCOUNTER — Encounter: Payer: 59 | Admitting: Internal Medicine

## 2021-12-09 ENCOUNTER — Telehealth: Payer: Self-pay | Admitting: Internal Medicine

## 2021-12-09 NOTE — Telephone Encounter (Signed)
Copied from [place] [id number]. Topic: Medical Record Request - Patient ROI Request >> Dec 09, 2021  9:19 AM Julie Castillo wrote: Reason for CRM: The patient has called to request a copy of their vaccinations   The patient would like a list mailed to them at their address on file   The patient would like the dates of their vaccinations included in the list  Please contact further if needed

## 2021-12-09 NOTE — Telephone Encounter (Signed)
Labs printed and mailed.  KP 

## 2022-01-05 ENCOUNTER — Other Ambulatory Visit: Payer: Self-pay | Admitting: Family Medicine

## 2022-01-05 DIAGNOSIS — Z1231 Encounter for screening mammogram for malignant neoplasm of breast: Secondary | ICD-10-CM

## 2022-02-02 ENCOUNTER — Ambulatory Visit
Admission: RE | Admit: 2022-02-02 | Discharge: 2022-02-02 | Disposition: A | Discharge status: Home / Self Care | Payer: Medicare Other | Source: Ambulatory Visit | Attending: Family Medicine | Admitting: Family Medicine

## 2022-02-02 DIAGNOSIS — Z1231 Encounter for screening mammogram for malignant neoplasm of breast: Secondary | ICD-10-CM | POA: Diagnosis not present

## 2022-03-20 ENCOUNTER — Telehealth: Payer: Self-pay | Admitting: Family Medicine

## 2022-03-20 NOTE — Telephone Encounter (Signed)
Spoke with patient she stated she had change pcp due to distance.

## 2023-02-01 ENCOUNTER — Other Ambulatory Visit: Payer: Self-pay | Admitting: Internal Medicine

## 2023-02-01 DIAGNOSIS — Z1231 Encounter for screening mammogram for malignant neoplasm of breast: Secondary | ICD-10-CM

## 2023-02-18 ENCOUNTER — Ambulatory Visit
Admission: RE | Admit: 2023-02-18 | Discharge: 2023-02-18 | Disposition: A | Discharge status: Home / Self Care | Payer: Medicare PPO | Source: Ambulatory Visit | Attending: Internal Medicine | Admitting: Internal Medicine

## 2023-02-18 DIAGNOSIS — Z1231 Encounter for screening mammogram for malignant neoplasm of breast: Secondary | ICD-10-CM | POA: Insufficient documentation
